# Patient Record
Sex: Male | Born: 1995 | Race: Black or African American | Hispanic: No | Marital: Single | Smoking: Never smoker
Health system: Southern US, Community
[De-identification: ages and names within clinical notes are randomized; demographics above are authoritative.]

## PROBLEM LIST (undated history)

## (undated) DIAGNOSIS — E669 Obesity, unspecified: Secondary | ICD-10-CM

## (undated) DIAGNOSIS — E662 Morbid (severe) obesity with alveolar hypoventilation: Secondary | ICD-10-CM

## (undated) DIAGNOSIS — Q8711 Prader-Willi syndrome: Secondary | ICD-10-CM

## (undated) DIAGNOSIS — I7300 Raynaud's syndrome without gangrene: Secondary | ICD-10-CM

## (undated) DIAGNOSIS — G4730 Sleep apnea, unspecified: Secondary | ICD-10-CM

## (undated) DIAGNOSIS — N289 Disorder of kidney and ureter, unspecified: Secondary | ICD-10-CM

## (undated) DIAGNOSIS — E119 Type 2 diabetes mellitus without complications: Secondary | ICD-10-CM

## (undated) DIAGNOSIS — I10 Essential (primary) hypertension: Secondary | ICD-10-CM

## (undated) DIAGNOSIS — I509 Heart failure, unspecified: Secondary | ICD-10-CM

## (undated) HISTORY — DX: Prader-Willi syndrome: Q87.11

## (undated) HISTORY — DX: Morbid (severe) obesity with alveolar hypoventilation: E66.2

## (undated) HISTORY — DX: Obesity, unspecified: E66.9

## (undated) HISTORY — DX: Sleep apnea, unspecified: G47.30

## (undated) HISTORY — PX: TONSILLECTOMY: SUR1361

## (undated) HISTORY — PX: NISSEN FUNDOPLICATION: SHX2091

---
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
## 2013-01-07 ENCOUNTER — Encounter: Payer: Self-pay | Admitting: Emergency Medicine

## 2013-01-07 ENCOUNTER — Ambulatory Visit (INDEPENDENT_AMBULATORY_CARE_PROVIDER_SITE_OTHER): Payer: Medicaid Other | Admitting: Emergency Medicine

## 2013-01-07 VITALS — BP 128/82 | HR 107 | Temp 98.5°F | Ht <= 58 in | Wt 358.6 lb

## 2013-01-07 DIAGNOSIS — E662 Morbid (severe) obesity with alveolar hypoventilation: Secondary | ICD-10-CM

## 2013-01-07 DIAGNOSIS — J45909 Unspecified asthma, uncomplicated: Secondary | ICD-10-CM

## 2013-01-07 DIAGNOSIS — J309 Allergic rhinitis, unspecified: Secondary | ICD-10-CM

## 2013-01-07 NOTE — Assessment & Plan Note (Signed)
Continue zyrtec and singulair Try Nasonex 2 sprays each nostril daily. If this spray helps with drainage then call our office and we will send a prescription for fluticasone to your pharmacy.

## 2013-01-07 NOTE — Patient Instructions (Addendum)
Please continue your BiPAP 20/15 + oxygen every night Use your pulmicort twice a day Use albuterol as needed Continue your exercise program Continue zyrtec and singulair Try Nasonex 2 sprays each nostril daily. If this spray helps with drainage then call our office and we will send a prescription for fluticasone to your pharmacy.  Follow with Dr Delton Coombes in 4 months or sooner if you have any problems.

## 2013-01-07 NOTE — Progress Notes (Signed)
Subjective:    Patient ID: [name], male    DOB: 10-21-1995, 17 y.o.   MRN: [id number]   HPI 17 yo never smoker, Prader-Willi Syndrome c/b obesity, OSA/OHS using BiPAP 20/15 + 4L/min O2 every night, also w hx HTN, allergic rhinitis, kidney atrophy (on ddAVP, oxybut). Was dx w asthma as a child, has continued to have into teen years.  He has an occasional cough, wheeze with exertion. Takes zyrtec, Singulair for allergies - still w allergy sx and drainage right now. Newly on pulmicort 4 puff bid. Albuterol 3-4x a week. He walks with mom daily.    Review of Systems  Constitutional: Negative for fever and unexpected weight change.  HENT: Positive for congestion, postnasal drip and sinus pressure. Negative for ear pain, nosebleeds, sore throat, rhinorrhea, sneezing, trouble swallowing and dental problem.   Eyes: Negative for redness and itching.  Respiratory: Positive for shortness of breath and wheezing. Negative for cough and chest tightness.   Cardiovascular: Negative for palpitations and leg swelling.  Gastrointestinal: Negative for nausea and vomiting.  Genitourinary: Negative for dysuria.  Musculoskeletal: Negative for joint swelling.  Skin: Negative for rash.  Neurological: Negative for headaches.  Hematological: Does not bruise/bleed easily.  Psychiatric/Behavioral: Negative for dysphoric mood. The patient is not nervous/anxious.    Past Medical History  Diagnosis Date  . Asthma   . Prader-Willi syndrome   . Obesity   . Obesity hypoventilation syndrome   . Sleep apnea      No family history on file.   History   Social History  . Marital Status: Single    Spouse Name: N/A    Number of Children: N/A  . Years of Education: N/A   Occupational History  . Not on file.   Social History Main Topics  . Smoking status: Never Smoker   . Smokeless tobacco: Never Used  . Alcohol Use: No  . Drug Use: No  . Sexually Active: No   Other Topics Concern  . Not on file    Social History Narrative   Lives with mom, very supportive.      No Known Allergies   Outpatient Prescriptions Prior to Visit  Medication Sig Dispense Refill  . budesonide (PULMICORT) 180 MCG/ACT inhaler Inhale 4 puffs into the lungs 2 (two) times daily.  2 Inhaler  3  . cetirizine (ZYRTEC ALLERGY) 10 MG tablet Take 1 tablet (10 mg total) by mouth daily.  30 tablet  11  . clotrimazole (LOTRIMIN) 1 % cream apply to affected area twice a day  30 g  1  . desmopressin (DDAVP) 0.2 MG tablet Take 0.2 mg by mouth at bedtime.        Marland Kitchen lisinopril-hydrochlorothiazide (PRINZIDE,ZESTORETIC) 20-12.5 MG per tablet take 1 tablet by mouth once daily  30 tablet  2  . Misc. Devices MISC Please provide a pulse oximeter for the ear lobe. Patient has diagnosis of Raynaud, asthma  1 each  0  . montelukast (SINGULAIR) 10 MG tablet take 1 tablet by mouth at bedtime  30 tablet  3  . mupirocin ointment (BACTROBAN) 2 % apply to affected area three times a day for 7 days  22 g  0  . nystatin (MYCOSTATIN/NYSTOP) 100000 UNIT/GM POWD APPLY TO AREA WITHIN SKIN FOLDS TWO TIMES A DAY WHEN THERE IS REDNESS OR IRRITATION PRESENT  15 g  0  . oxybutynin (DITROPAN) 5 MG tablet Take 1 tablet (5 mg total) by mouth at bedtime.  30 tablet  3  .  PROAIR HFA 108 (90 BASE) MCG/ACT inhaler inhale 2 puffs every 6 hours if needed  8.5 g  2  . Respiratory Therapy Supplies (NEBULIZER/ADULT MASK) KIT 1 Device by Does not apply route once.  1 each  0  . Respiratory Therapy Supplies MISC BiPap, titration setting 20/15  1 each  0  . Spacer/Aero-Holding Chambers (AEROCHAMBER MV) inhaler Use with albuterol inhaler  1 each  1  . budesonide (PULMICORT) 0.5 MG/2ML nebulizer solution Take 2 mLs (0.5 mg total) by nebulization 2 (two) times daily.  120 mL  11  . albuterol (PROVENTIL) nebulizer solution 2.5 mg       . ipratropium (ATROVENT) 0.02 % nebulizer solution 0.5 mg        No facility-administered medications prior to visit.          Objective:   Physical Exam Filed Vitals:   01/07/13 1456  BP: 128/82  Pulse: 107  Temp: 98.5 F (36.9 C)  TempSrc: Oral  Height: 4\' 9"  (1.448 m)  Weight: 358 lb 9.6 oz (162.66 kg)  SpO2: 96%   Gen: Pleasant, morbidly obese, in no distress,  Quiet affect but does speak and answer questions.   ENT: No lesions,  mouth clear,  oropharynx clear, no active  postnasal drip  Neck: No JVD, no TMG, no carotid bruits  Lungs: No use of accessory muscles, very distant, clear without rales or rhonchi  Cardiovascular: RRR, heart sounds normal, no murmur or gallops, no peripheral edema  Musculoskeletal: No deformities, no cyanosis or clubbing  Neuro: alert, answers simple questions, moves all ext, faces consistent w his hx P-W syndrome.   Skin: Warm, no lesions or rashes     Assessment & Plan:  Obesity hypoventilation syndrome continue your BiPAP 20/15 + oxygen every night Not requiring O2 during the day currently  ASTHMA, PERSISTENT Use your pulmicort twice a day Use albuterol as needed Continue your exercise program Manage allergies as an exacerbating factor Follow with Dr Delton Coombes in 4 months or sooner if you have any problems  Allergic rhinitis Continue zyrtec and singulair Try Nasonex 2 sprays each nostril daily. If this spray helps with drainage then call our office and we will send a prescription for fluticasone to your pharmacy.

## 2013-01-07 NOTE — Assessment & Plan Note (Signed)
Use your pulmicort twice a day Use albuterol as needed Continue your exercise program Manage allergies as an exacerbating factor Follow with Dr Delton Coombes in 4 months or sooner if you have any problems

## 2013-01-07 NOTE — Assessment & Plan Note (Signed)
continue your BiPAP 20/15 + oxygen every night Not requiring O2 during the day currently

## 2013-01-19 ENCOUNTER — Encounter: Payer: Self-pay | Admitting: Family Medicine

## 2013-01-19 ENCOUNTER — Ambulatory Visit (INDEPENDENT_AMBULATORY_CARE_PROVIDER_SITE_OTHER): Payer: Medicaid Other | Admitting: Family Medicine

## 2013-01-19 VITALS — BP 128/82 | HR 116 | Temp 99.5°F | Ht <= 58 in | Wt 356.0 lb

## 2013-01-19 DIAGNOSIS — J45909 Unspecified asthma, uncomplicated: Secondary | ICD-10-CM

## 2013-01-19 DIAGNOSIS — J309 Allergic rhinitis, unspecified: Secondary | ICD-10-CM

## 2013-01-19 MED ORDER — FLUTICASONE PROPIONATE 50 MCG/ACT NA SUSP: 2.0000 | Freq: Every day | NASAL | Status: DC

## 2013-01-19 MED ORDER — BECLOMETHASONE DIPROPIONATE 80 MCG/ACT IN AERS: 4.0000 | INHALATION_SPRAY | Freq: Two times a day (BID) | RESPIRATORY_TRACT | Status: DC

## 2013-01-19 NOTE — Assessment & Plan Note (Signed)
Appreciate pulmonology consultation. Try changing from maximum dose Pulmicort to QVAR due to concern he is not inhaling all of powder completely.

## 2013-01-19 NOTE — Patient Instructions (Addendum)
Follow-up in 6 months  Have fun at the beach

## 2013-01-19 NOTE — Progress Notes (Signed)
  Subjective:    Patient ID: [name], male    DOB: 04-23-1996, 17 y.o.   MRN: [id number]  HPI # PULM: persistent asthma, OHS, OSA on BiPap, allergic rhinitis He saw Dr. Delton Coombes recently for initial consultation.  Nasonex working well; she is wondering if she can get Rx for Flonase She would like to try QVAR instead of Pulmicort because she is concerned he is not inhaling all of the powder completely; he has a chamber for albuterol   Review of Systems Denies worsening dyspnea  Allergies, medication, past medical history reviewed.  Smoking status noted.     Objective:   Physical Exam Wt stable from last visit GEN: NAD; morbidly obese CV: RRR PULM: mildly increased WOB (baseline); distant breath sounds due to girth     Assessment & Plan:

## 2013-01-19 NOTE — Assessment & Plan Note (Signed)
Nasonex working well for him. Rx for Flonase.

## 2013-01-21 ENCOUNTER — Ambulatory Visit: Payer: Medicaid Other | Admitting: Family Medicine

## 2013-02-08 ENCOUNTER — Other Ambulatory Visit: Payer: Self-pay | Admitting: Family Medicine

## 2013-02-08 NOTE — Telephone Encounter (Signed)
Please advise patient that Rx was refilled, though if needs additional refills will need to be seen in clinic for this issue. Thanks.

## 2013-02-12 ENCOUNTER — Ambulatory Visit (INDEPENDENT_AMBULATORY_CARE_PROVIDER_SITE_OTHER): Payer: Medicaid Other | Admitting: Family Medicine

## 2013-02-12 VITALS — BP 112/71 | HR 116 | Temp 98.1°F | Wt 353.0 lb

## 2013-02-12 DIAGNOSIS — Q8711 Prader-Willi syndrome: Secondary | ICD-10-CM

## 2013-02-12 NOTE — Assessment & Plan Note (Signed)
Patient here for wheel chair assessment due to obesity related to prader willi. I agree with need for motorized wheel chair as patient is unable to ambulate any significant distance without become short of breath related to his body habitus. Forms filled out and placed in the to fax box.

## 2013-02-12 NOTE — Progress Notes (Signed)
  Subjective:    Patient ID: [name], male    DOB: July 06, 1996, 17 y.o.   MRN: [id number]  HPI Patient is a 17 yo male here for mobility examination for wheel chair assessment.  Patients mother provide the history. Patient not able to bathe or dress self or ambulate long distances without becoming short of breath. Is able to feed self. Not able to use cane or walker due to need for seated resting position to help with breathing. Manual wheel chair unable to use due to not enough strength to propel self. Patient has been evaluated by rehab center and deemed to be able to operate a power wheel chair safely.  Patient with shortness of breath related to obesity hypoventilations syndrome. Obesity related to prader-willi syndrome. Condition present since birth. Currently stable. Has tried manual wheel chair in past, but does not have the strength to propel this.    Review of Systems see HPI     Objective:   Physical Exam  Constitutional: He appears well-developed and well-nourished.  Obese  Cardiovascular: Normal rate, regular rhythm and normal heart sounds.   Pulmonary/Chest: Effort normal and breath sounds normal.  Difficult to appreciate due to body habitus On ambulation the patient became visibly short of breath at about 40 feet.  Musculoskeletal:  Range of motion in bilateral LE diminished due to body habitus Good posture. Good sitting and standing balance  Neurological:  Normal gait  BP 112/71  Pulse 116  Temp(Src) 98.1 F (36.7 C) (Oral)  Wt 353 lb (160.12 kg)  BMI 76.37 kg/m2    Assessment & Plan:

## 2013-02-12 NOTE — Patient Instructions (Signed)
Nice to meet you today. I will fill out the wheel chair forms and send them in today. I also placed a referral for physical therapy, someone should call regarding this.

## 2013-02-18 ENCOUNTER — Other Ambulatory Visit: Payer: Self-pay | Admitting: Family Medicine

## 2013-02-19 ENCOUNTER — Ambulatory Visit: Payer: Medicaid Other | Attending: Family Medicine | Admitting: Physical Therapy

## 2013-02-19 DIAGNOSIS — R262 Difficulty in walking, not elsewhere classified: Secondary | ICD-10-CM | POA: Insufficient documentation

## 2013-02-19 DIAGNOSIS — IMO0001 Reserved for inherently not codable concepts without codable children: Secondary | ICD-10-CM | POA: Insufficient documentation

## 2013-02-24 ENCOUNTER — Telehealth: Payer: Self-pay | Admitting: Family Medicine

## 2013-02-24 DIAGNOSIS — E662 Morbid (severe) obesity with alveolar hypoventilation: Secondary | ICD-10-CM

## 2013-02-24 NOTE — Telephone Encounter (Signed)
Mother called and would like Dr.Sonnneberg write orders stating that he can have oxygen all time. The physical therapy doctors suggested this. JW

## 2013-02-25 NOTE — Telephone Encounter (Signed)
Patients mother should have the physical therapist fax [name] their recommendations prior to me placing this order. Will be happy to do this once we receive this from PT.

## 2013-02-26 NOTE — Telephone Encounter (Signed)
Mother notified of message, see below.  Manjot Beumer, Darlyne Russian, CMA

## 2013-03-04 ENCOUNTER — Telehealth: Payer: Self-pay | Admitting: Family Medicine

## 2013-03-04 DIAGNOSIS — Q8711 Prader-Willi syndrome: Secondary | ICD-10-CM

## 2013-03-04 NOTE — Telephone Encounter (Signed)
Looked in Dr.Sonnenbergs box up front and didn't see form.  Will fwd to MD to see if he has seen form.  Reymond Maynez, Darlyne Russian, CMA

## 2013-03-04 NOTE — Telephone Encounter (Signed)
Received documents from PT. Order for O2 signed today.

## 2013-03-04 NOTE — Telephone Encounter (Signed)
Rx stating patient may use O2 therapy in caregivers care left up front for mother to pick up.  LMOVM.  Laquitha Heslin, Darlyne Russian, CMA

## 2013-03-04 NOTE — Telephone Encounter (Signed)
Mother called wanting to know what the status update is on the orders for oxygen. She said that the Physical therapist faxed [name] the form on Monday. If we didn't get it to let her know so they can re-fax it again. JW

## 2013-03-05 NOTE — Telephone Encounter (Signed)
Rx filled out to specifications. Is in the box at front dest to pick up.

## 2013-03-05 NOTE — Telephone Encounter (Signed)
Mother came to pick up prescription and stated that it must be more specific : how many liters? Via cannula/mask/etc.? To maintain o2 stat at? Apply when o2 stat gets to ?  Please let her know when ready. Script in your box. Wyatt Haste, RN-BSN

## 2013-03-08 NOTE — Telephone Encounter (Signed)
Pt's mother notified.  Maximo Spratling L, CMA  

## 2013-03-09 ENCOUNTER — Ambulatory Visit: Payer: Medicaid Other | Attending: Family Medicine | Admitting: Physical Therapy

## 2013-03-09 DIAGNOSIS — IMO0001 Reserved for inherently not codable concepts without codable children: Secondary | ICD-10-CM | POA: Insufficient documentation

## 2013-03-09 DIAGNOSIS — R262 Difficulty in walking, not elsewhere classified: Secondary | ICD-10-CM | POA: Insufficient documentation

## 2013-03-11 ENCOUNTER — Ambulatory Visit: Payer: Medicaid Other | Admitting: Physical Therapy

## 2013-03-15 ENCOUNTER — Ambulatory Visit: Payer: Medicaid Other | Admitting: Physical Therapy

## 2013-03-18 ENCOUNTER — Other Ambulatory Visit: Payer: Self-pay | Admitting: Family Medicine

## 2013-03-19 ENCOUNTER — Ambulatory Visit: Payer: Medicaid Other | Admitting: Physical Therapy

## 2013-03-23 ENCOUNTER — Ambulatory Visit: Payer: Medicaid Other | Admitting: Physical Therapy

## 2013-03-25 ENCOUNTER — Ambulatory Visit: Payer: Medicaid Other | Admitting: Physical Therapy

## 2013-03-30 ENCOUNTER — Ambulatory Visit: Payer: Medicaid Other | Admitting: Physical Therapy

## 2013-03-30 ENCOUNTER — Telehealth: Payer: Self-pay | Admitting: Family Medicine

## 2013-03-30 NOTE — Telephone Encounter (Signed)
Pt's mother dropped off paperwork to be filled out regarding [name] being able to get oxygen or albuterol if needed at school.

## 2013-03-30 NOTE — Telephone Encounter (Signed)
Placed in Dr. Antony Haste box Wyatt Haste, RN-BSN

## 2013-03-31 ENCOUNTER — Telehealth: Payer: Self-pay | Admitting: *Deleted

## 2013-03-31 ENCOUNTER — Encounter: Payer: Self-pay | Admitting: Family Medicine

## 2013-03-31 NOTE — Telephone Encounter (Signed)
Mother called and informed form is ready Wyatt Haste, RN-BSN

## 2013-04-01 ENCOUNTER — Ambulatory Visit: Payer: Medicaid Other | Admitting: Physical Therapy

## 2013-04-06 ENCOUNTER — Ambulatory Visit: Payer: Medicaid Other | Admitting: Physical Therapy

## 2013-04-08 ENCOUNTER — Ambulatory Visit: Payer: Medicaid Other | Attending: Family Medicine | Admitting: Physical Therapy

## 2013-04-08 DIAGNOSIS — R262 Difficulty in walking, not elsewhere classified: Secondary | ICD-10-CM | POA: Insufficient documentation

## 2013-04-08 DIAGNOSIS — IMO0001 Reserved for inherently not codable concepts without codable children: Secondary | ICD-10-CM | POA: Insufficient documentation

## 2013-04-14 ENCOUNTER — Other Ambulatory Visit: Payer: Self-pay | Admitting: Family Medicine

## 2013-04-15 ENCOUNTER — Ambulatory Visit: Payer: Medicaid Other | Admitting: Physical Therapy

## 2013-04-20 ENCOUNTER — Other Ambulatory Visit: Payer: Self-pay | Admitting: Family Medicine

## 2013-04-22 ENCOUNTER — Ambulatory Visit: Payer: Medicaid Other | Admitting: Physical Therapy

## 2013-05-19 ENCOUNTER — Other Ambulatory Visit: Payer: Self-pay | Admitting: Family Medicine

## 2013-06-04 ENCOUNTER — Telehealth: Payer: Self-pay | Admitting: Family Medicine

## 2013-06-04 NOTE — Telephone Encounter (Signed)
Mother called because she is unsure why Dr. Birdie Sons would not refill her son's Bactroban 2%. She was told by the pharmacy that he said he needed a f/u before he could prescribe this. She would like some more explanation. JW

## 2013-06-04 NOTE — Telephone Encounter (Signed)
Please advise.  I looked through telephone notes and didn't see where it had been requested from pharmacy.  Was it a faxed rx?  Olon Russ,CMA

## 2013-06-06 MED ORDER — MUPIROCIN 2 % EX OINT: TOPICAL_OINTMENT | CUTANEOUS | Status: DC

## 2013-06-06 NOTE — Telephone Encounter (Signed)
I did not refill it initially due to the fact that I did not originally prescribe this medication and could not locate a reason for this prescription initially in his chart. 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 I sent in a prescription for this medication to his pharmacy. Please advise his mother of this, but also request that they come in for a follow-up appointment for this issue as I have not ever seen him for this and because the current issue may be different from his previous issue. Thanks.

## 2013-06-07 NOTE — Telephone Encounter (Signed)
appt made for 06/30/13 @2 :30pm.    Jazmin Hartsell,CMA

## 2013-06-30 ENCOUNTER — Encounter: Payer: Self-pay | Admitting: Family Medicine

## 2013-06-30 ENCOUNTER — Ambulatory Visit (INDEPENDENT_AMBULATORY_CARE_PROVIDER_SITE_OTHER): Payer: Medicaid Other | Admitting: Family Medicine

## 2013-06-30 VITALS — BP 132/72 | HR 100 | Temp 98.5°F | Ht <= 58 in | Wt 373.0 lb

## 2013-06-30 DIAGNOSIS — I10 Essential (primary) hypertension: Secondary | ICD-10-CM

## 2013-06-30 DIAGNOSIS — L304 Erythema intertrigo: Secondary | ICD-10-CM

## 2013-06-30 DIAGNOSIS — L538 Other specified erythematous conditions: Secondary | ICD-10-CM

## 2013-06-30 DIAGNOSIS — Z23 Encounter for immunization: Secondary | ICD-10-CM

## 2013-06-30 DIAGNOSIS — R635 Abnormal weight gain: Secondary | ICD-10-CM

## 2013-06-30 LAB — BASIC METABOLIC PANEL
BUN: 26 mg/dL — ABNORMAL HIGH (ref 6–23)
CO2: 26 mEq/L (ref 19–32)
Calcium: 9.4 mg/dL (ref 8.4–10.5)
Creat: 0.83 mg/dL (ref 0.50–1.35)

## 2013-06-30 MED ORDER — NYSTATIN-TRIAMCINOLONE 100000-0.1 UNIT/GM-% EX CREA: 1.0000 "application " | TOPICAL_CREAM | Freq: Two times a day (BID) | CUTANEOUS | Status: DC

## 2013-06-30 NOTE — Patient Instructions (Signed)
Nice to see you again. I have refilled Brelan's bactroban and nystatin. We will call with the results of the culture and blood tests.

## 2013-07-02 DIAGNOSIS — R635 Abnormal weight gain: Secondary | ICD-10-CM | POA: Insufficient documentation

## 2013-07-02 NOTE — Assessment & Plan Note (Signed)
Culture of serous fluid taken. Does not appear infected. Will continue bactroban and nystatin for this.

## 2013-07-02 NOTE — Assessment & Plan Note (Signed)
Patient has gained a significant amount of weight over the past year. 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 Will discuss weight loss strategies at his next visit.

## 2013-07-02 NOTE — Assessment & Plan Note (Signed)
At goal. Continue lisinopril/HCTZ. Will obtain BMET.

## 2013-07-02 NOTE — Progress Notes (Signed)
Patient ID: [name], male   DOB: 02-03-1996, 17 y.o.   MRN: [id number]  Marikay Alar, MD Phone: [phone]  [name] is a 17 y.o. male who presents today for f/u intertrigo and HTN.  Patient has had this intermittently for a long time. Mom states has been treated with bactroban ointment and nystatin powder and this has been effective. She notes he currently has some redness in the skin folds on his back and some weeping from these areas. She does not note any spread of the erythema. He denies pain, though states this area itches.  HYPERTENSION Disease Monitoring Home BP Monitoring no Chest pain- no    Dyspnea- no Medications Compliance-  Taking lisinopril/HCTZ   Past Medical History  Diagnosis Date  . Asthma   . Prader-Willi syndrome   . Obesity   . Obesity hypoventilation syndrome   . Sleep apnea     History  Smoking status  . Never Smoker   Smokeless tobacco  . Never Used    No family history on file.  Current Outpatient Prescriptions on File Prior to Visit  Medication Sig Dispense Refill  . albuterol (PROVENTIL) (2.5 MG/3ML) 0.083% nebulizer solution INHALE 1 VIAL IN NEBULIZER ONCE DAILY  75 mL  12  . beclomethasone (QVAR) 80 MCG/ACT inhaler Inhale 4 puffs into the lungs 2 (two) times daily.  2 Inhaler  12  . cetirizine (ZYRTEC) 10 MG tablet take 1 tablet by mouth once daily  30 tablet  11  . clotrimazole (LOTRIMIN) 1 % cream apply to affected area twice a day  30 g  0  . desmopressin (DDAVP) 0.2 MG tablet Take 0.2 mg by mouth at bedtime.        . fluticasone (FLONASE) 50 MCG/ACT nasal spray Place 2 sprays into the nose daily.  16 g  6  . lisinopril-hydrochlorothiazide (PRINZIDE,ZESTORETIC) 20-12.5 MG per tablet take 1 tablet by mouth once daily  30 tablet  2  . Misc. Devices MISC Please provide a pulse oximeter for the ear lobe. Patient has diagnosis of Raynaud, asthma  1 each  0  . montelukast (SINGULAIR) 10 MG tablet take 1 tablet by mouth at bedtime   30 tablet  3  . mupirocin ointment (BACTROBAN) 2 % apply to affected area three times a day for 7 days  22 g  0  . nystatin (MYCOSTATIN/NYSTOP) 100000 UNIT/GM POWD APPLY TO AREA WITHIN SKIN FOLDS TWO TIMES A DAY WHEN THERE IS REDNESS OR IRRITATION PRESENT  15 g  0  . oxybutynin (DITROPAN) 5 MG tablet Take 1 tablet (5 mg total) by mouth at bedtime.  30 tablet  3  . PROAIR HFA 108 (90 BASE) MCG/ACT inhaler inhale 2 puffs every 6 hours if needed  8.5 g  2  . Respiratory Therapy Supplies (NEBULIZER/ADULT MASK) KIT 1 Device by Does not apply route once.  1 each  0  . Respiratory Therapy Supplies MISC BiPap, titration setting 20/15  1 each  0  . Spacer/Aero-Holding Chambers (AEROCHAMBER MV) inhaler Use with albuterol inhaler  1 each  1   No current facility-administered medications on file prior to visit.    ROS: Per HPI   Physical Exam Filed Vitals:   06/30/13 1503  BP: 132/72  Pulse: 100  Temp: 98.5 F (36.9 C)    Physical Examination: General appearance - alert, well appearing, and in no distress Chest - clear to auscultation, no wheezes, rales or rhonchi, symmetric air entry Heart - normal  rate, regular rhythm, normal S1, S2, no murmurs, rubs, clicks or gallops Skin - patient with intertrigo in skin folds of back, note intermittent areas of skin openings with weeping of serous material, areas of erythema limited to skin folds    Chemistry      Component Value Date/Time   NA 138 06/30/2013 1554   K 4.6 06/30/2013 1554   CL 100 06/30/2013 1554   CO2 26 06/30/2013 1554   BUN 26* 06/30/2013 1554   CREATININE 0.83 06/30/2013 1554   CREATININE 0.78 10/08/2010 0649      Component Value Date/Time   CALCIUM 9.4 06/30/2013 1554   ALKPHOS 143 10/05/2010 1329   AST 49* 10/05/2010 1329   ALT 29 10/05/2010 1329   BILITOT 1.7* 10/05/2010 1329      Assessment/Plan: Please see individual problem list.

## 2013-07-03 LAB — WOUND CULTURE

## 2013-07-05 ENCOUNTER — Telehealth: Payer: Self-pay | Admitting: *Deleted

## 2013-07-05 NOTE — Telephone Encounter (Signed)
Pt mom informed and appt made for 08/04/13 (1st avaliable). [name], [name]

## 2013-07-05 NOTE — Telephone Encounter (Signed)
I wanted to discuss the exact things she brought up. They should continue to do those things and we can monitor his weight at his follow-up visit in 2-3 months.

## 2013-07-05 NOTE — Telephone Encounter (Signed)
Spoke with mom and she would like to know exactly what you would like to discuss.  States that he does physical therapy, works out and she watches what he eats.  Mom is unsure just what the plans for this visit might be before she schedules this. Jazmin Hartsell,CMA

## 2013-07-05 NOTE — Telephone Encounter (Signed)
Message copied by Henri Medal on Mon Jul 05, 2013  9:44 AM ------      Message from: Birdie Sons, ERIC G      Created: Fri Jul 02, 2013 12:26 PM       Please call the patients mother to have him follow-up sometime in the next couple of weeks regarding his weight gain over the past year. He is currently at his highest documented weight. ------

## 2013-07-12 ENCOUNTER — Ambulatory Visit (INDEPENDENT_AMBULATORY_CARE_PROVIDER_SITE_OTHER): Payer: Medicaid Other | Admitting: Emergency Medicine

## 2013-07-12 ENCOUNTER — Encounter: Payer: Self-pay | Admitting: Emergency Medicine

## 2013-07-12 VITALS — BP 140/82 | HR 111 | Ht 59.0 in | Wt 370.0 lb

## 2013-07-12 DIAGNOSIS — E662 Morbid (severe) obesity with alveolar hypoventilation: Secondary | ICD-10-CM

## 2013-07-12 DIAGNOSIS — J45909 Unspecified asthma, uncomplicated: Secondary | ICD-10-CM

## 2013-07-12 MED ORDER — ALBUTEROL SULFATE HFA 108 (90 BASE) MCG/ACT IN AERS: INHALATION_SPRAY | RESPIRATORY_TRACT | Status: DC

## 2013-07-12 NOTE — Assessment & Plan Note (Signed)
Will continue current BiPAP. Supplies through [employer]. In good repair.

## 2013-07-12 NOTE — Progress Notes (Signed)
  Subjective:    Patient ID: [name], male    DOB: 1996/06/14, 17 y.o.   MRN: [id number]   HPI 17 yo never smoker, Prader-Willi Syndrome c/b obesity, OSA/OHS using BiPAP 20/15 + 4L/min O2 every night, also w hx HTN, allergic rhinitis, kidney atrophy (on ddAVP, oxybut). Was dx w asthma as a child, has continued to have into teen years.  He has an occasional cough, wheeze with exertion. Takes zyrtec, Singulair for allergies - still w allergy sx and drainage right now. Newly on pulmicort 4 puff bid. Albuterol 3-4x a week. He walks with mom daily.   ROV 07/12/13 -- Prader-Willi Syndrome c/b obesity, OSA/OHS using BiPAP 20/15 + 4L/min.  Carries dx asthma, allergic rhinitis. His pulmicort has been changed to 4 puff QVAR 80 twice a day. He uses albuterol for dyspnea / SOB about 3-4x a week. Good compliance with BiPAP. No exacerbations since last time. Continues to exercise.    Review of Systems  Constitutional: Negative for fever and unexpected weight change.  HENT: Positive for congestion, postnasal drip and sinus pressure. Negative for dental problem, ear pain, nosebleeds, rhinorrhea, sneezing, sore throat and trouble swallowing.   Eyes: Negative for redness and itching.  Respiratory: Positive for shortness of breath and wheezing. Negative for cough and chest tightness.   Cardiovascular: Negative for palpitations and leg swelling.  Gastrointestinal: Negative for nausea and vomiting.  Genitourinary: Negative for dysuria.  Musculoskeletal: Negative for joint swelling.  Skin: Negative for rash.  Neurological: Negative for headaches.  Hematological: Does not bruise/bleed easily.  Psychiatric/Behavioral: Negative for dysphoric mood. The patient is not nervous/anxious.        Objective:   Physical Exam Filed Vitals:   07/12/13 1648  BP: 140/82  Pulse: 111  Height: 4\' 11"  (1.499 m)  Weight: 316 lb (143.337 kg)  SpO2: 92%   Gen: Pleasant, morbidly obese, in no distress,  Quiet affect but  does speak and answer questions.   ENT: No lesions,  mouth clear,  oropharynx clear, no active  postnasal drip  Neck: No JVD, no TMG, no carotid bruits  Lungs: No use of accessory muscles, very distant, clear without rales or rhonchi  Cardiovascular: RRR, heart sounds normal, no murmur or gallops, no peripheral edema  Musculoskeletal: No deformities, no cyanosis or clubbing  Neuro: alert, answers simple questions, moves all ext, faces consistent w his hx P-W syndrome.   Skin: Warm, no lesions or rashes     Assessment & Plan:  OBESITY, MORBID Discussed weight loss today. His mother has him on a diet and they are working on exercise  ASTHMA, PERSISTENT Stable on current regimen, will continue QVAR 4 puffs bid. Consider decrease to 2 puffs bid next time if doing well.   Obesity hypoventilation syndrome Will continue current BiPAP. Supplies through [employer]. In good repair.

## 2013-07-12 NOTE — Patient Instructions (Signed)
Please continue your BiPAP mask + oxygen every night Continue QVAR 4 puffs twice a day Use your albuterol as needed. You can use either nebulizer or ProAir 2 puffs if needed for breathing.  Continue to exercise and work on your weight loss.  Follow with Dr Delton Coombes in 6 months or sooner if you have any problems

## 2013-07-12 NOTE — Assessment & Plan Note (Signed)
Stable on current regimen, will continue QVAR 4 puffs bid. Consider decrease to 2 puffs bid next time if doing well.

## 2013-07-12 NOTE — Assessment & Plan Note (Signed)
Discussed weight loss today. His mother has him on a diet and they are working on exercise

## 2013-07-16 ENCOUNTER — Other Ambulatory Visit: Payer: Self-pay | Admitting: Family Medicine

## 2013-08-04 ENCOUNTER — Encounter: Payer: Self-pay | Admitting: Family Medicine

## 2013-08-04 ENCOUNTER — Ambulatory Visit (INDEPENDENT_AMBULATORY_CARE_PROVIDER_SITE_OTHER): Payer: Medicaid Other | Admitting: Family Medicine

## 2013-08-04 DIAGNOSIS — L304 Erythema intertrigo: Secondary | ICD-10-CM

## 2013-08-04 DIAGNOSIS — L538 Other specified erythematous conditions: Secondary | ICD-10-CM

## 2013-08-04 MED ORDER — NYSTATIN 100000 UNIT/GM EX POWD: CUTANEOUS | Status: DC

## 2013-08-04 MED ORDER — MUPIROCIN 2 % EX OINT: TOPICAL_OINTMENT | CUTANEOUS | Status: DC

## 2013-08-04 NOTE — Progress Notes (Signed)
Patient ID: [name], male   DOB: 10-15-1995, 17 y.o.   MRN: [id number]  Marikay Alar, MD Phone: [phone]  [name] is a 17 y.o. male who presents today for discussion of his weight.  Patient has had steady increase in weight over the past 2 years from 318 lbs to 373 lbs. Patient mother states this low weight was when the patient was sick and in the hospital for 10 days. She states she recently changed his diet to mostly vegetables and grilled meats. He is on a calorie restricted diet at 1200 calories daily. They use a plate with portion markers on it to determine calories. Notes he gets some physical activity at school in gym and with his "worker". He is now also walking 65 steps at home twice daily. His physical activity is limited by his oxygen desaturations that occur related to his obesity hypoventilation syndrome. Mom also notes the patient over the past year has been eating his lunch at school, as opposed to previously when she packed it for him.  Mom also notes he has areas of intertrigo on the back of his legs in his popliteal fossa. She notes nystatin powder and bactroban ointment work well for this. Needs refills of these medications.  Past Medical History  Diagnosis Date  . Asthma   . Prader-Willi syndrome   . Obesity   . Obesity hypoventilation syndrome   . Sleep apnea     History  Smoking status  . Never Smoker   Smokeless tobacco  . Never Used    No family history on file.  Current Outpatient Prescriptions on File Prior to Visit  Medication Sig Dispense Refill  . albuterol (PROAIR HFA) 108 (90 BASE) MCG/ACT inhaler inhale 2 puffs every 6 hours if needed  8.5 g  2  . albuterol (PROVENTIL) (2.5 MG/3ML) 0.083% nebulizer solution INHALE 1 VIAL IN NEBULIZER ONCE DAILY  75 mL  12  . cetirizine (ZYRTEC) 10 MG tablet take 1 tablet by mouth once daily  30 tablet  11  . clotrimazole (LOTRIMIN) 1 % cream apply to affected area twice a day  30 g  0  .  desmopressin (DDAVP) 0.2 MG tablet Take 0.2 mg by mouth at bedtime.        . fluticasone (FLONASE) 50 MCG/ACT nasal spray Place 2 sprays into the nose daily.  16 g  6  . lisinopril-hydrochlorothiazide (PRINZIDE,ZESTORETIC) 20-12.5 MG per tablet take 1 tablet by mouth once daily  30 tablet  2  . Misc. Devices MISC Please provide a pulse oximeter for the ear lobe. Patient has diagnosis of Raynaud, asthma  1 each  0  . montelukast (SINGULAIR) 10 MG tablet take 1 tablet by mouth at bedtime  30 tablet  3  . nystatin-triamcinolone (MYCOLOG II) cream Apply 1 application topically 2 (two) times daily.  30 g  0  . oxybutynin (DITROPAN) 5 MG tablet Take 1 tablet (5 mg total) by mouth at bedtime.  30 tablet  3  . QVAR 80 MCG/ACT inhaler inhale 4 puffs INTO THE LUNGS 2 TIMES DAILY  26.1 g  12  . Respiratory Therapy Supplies (NEBULIZER/ADULT MASK) KIT 1 Device by Does not apply route once.  1 each  0  . Respiratory Therapy Supplies MISC BiPap, titration setting 20/15  1 each  0  . Spacer/Aero-Holding Chambers (AEROCHAMBER MV) inhaler Use with albuterol inhaler  1 each  1   No current facility-administered medications on file prior to visit.  ROS: Per HPI   Physical Exam Filed Vitals:   08/04/13 1334  BP: 116/75  Pulse: 101  Temp: 98.1 F (36.7 C)    Physical Examination: General appearance - alert, well appearing, and in no distress Chest - clear to auscultation, no wheezes, rales or rhonchi, symmetric air entry Heart - normal rate, regular rhythm, normal S1, S2, no murmurs, rubs, clicks or gallops Skin - popliteal fossa noted to have hyperpigmented areas in the skin folds  Assessment/Plan: Please see individual problem list.

## 2013-08-04 NOTE — Patient Instructions (Signed)
Nice to see you again.  You are already doing some great things for your weight. Please continue to limit your calories to 1200 a day. And continue your current vegetable heavy diet. Please add additional exercise with doing the 65 steps twice in the morning and twice at night. Please allow for a 10 minute break in between these reps. I will see you back in 1 month to see how your weight is doing.

## 2013-08-05 NOTE — Assessment & Plan Note (Signed)
Discussed need for weight loss and healthy eating with increased activity level. He is to increase the number of times he walks 65 ft to 4 times daily. He was advised to take 10 minute rest breaks between each attempt. He is to continue his current diet as he is already down 6 lbs since his last visit with just this intervention. Will have return for a weight check in 1 month.

## 2013-08-05 NOTE — Assessment & Plan Note (Signed)
Areas of intertrigo in popliteal fossa bilaterally. Refilled nystatin and bactroban. Advised to keep these areas dry.

## 2013-08-17 ENCOUNTER — Other Ambulatory Visit: Payer: Self-pay | Admitting: Family Medicine

## 2013-08-30 ENCOUNTER — Ambulatory Visit (INDEPENDENT_AMBULATORY_CARE_PROVIDER_SITE_OTHER): Payer: Medicaid Other | Admitting: *Deleted

## 2013-08-30 VITALS — Wt 367.3 lb

## 2013-08-30 DIAGNOSIS — E662 Morbid (severe) obesity with alveolar hypoventilation: Secondary | ICD-10-CM

## 2013-08-30 NOTE — Progress Notes (Signed)
Pt in for weight check.  Weight today is 367.3 lbs.  Pt is still going all veggie diet and exercising.  Pt doses at least 4 laps around the track with at least 20 minutes rest time per lap. No other concerns today. Daphine DeutscherMartin, Tamika L

## 2013-09-17 ENCOUNTER — Other Ambulatory Visit: Payer: Self-pay | Admitting: Family Medicine

## 2013-09-24 ENCOUNTER — Telehealth: Payer: Self-pay | Admitting: Family Medicine

## 2013-09-24 NOTE — Telephone Encounter (Signed)
Mother dropped off CAP forms to be filled out.  Please call her when completed.

## 2013-09-27 NOTE — Telephone Encounter (Signed)
Placed in MDs box. [name]  

## 2013-09-29 NOTE — Telephone Encounter (Signed)
Please inform the patients mother that given the extent of the physical exam required to fill out the form the patient will need to be seen in the office. It is not necessary that the patients mother come with him to the appointment, he may come with his home health worker. Thanks.

## 2013-09-29 NOTE — Telephone Encounter (Signed)
Appt 10/11/2013. [name], Joshua RochesterJessica Mckinney

## 2013-10-11 ENCOUNTER — Ambulatory Visit: Payer: Medicaid Other | Admitting: Family Medicine

## 2013-10-18 ENCOUNTER — Other Ambulatory Visit: Payer: Self-pay | Admitting: Family Medicine

## 2013-10-21 ENCOUNTER — Ambulatory Visit (INDEPENDENT_AMBULATORY_CARE_PROVIDER_SITE_OTHER): Payer: Medicaid Other | Admitting: Family Medicine

## 2013-10-21 ENCOUNTER — Encounter: Payer: Self-pay | Admitting: Family Medicine

## 2013-10-21 VITALS — BP 126/84 | HR 100 | Temp 98.6°F | Wt 369.0 lb

## 2013-10-21 DIAGNOSIS — I10 Essential (primary) hypertension: Secondary | ICD-10-CM

## 2013-10-21 NOTE — Progress Notes (Signed)
Patient ID: Evaristo Buryazhae M Behrend, male   DOB: 1995/09/24, 18 y.o.   MRN: [id number]   Marikay AlarEric Sonnenberg, MD Phone: [phone]  [name] is a 18 y.o. male who presents today for HTN and for CAP form PE.  HYPERTENSION Disease Monitoring Home BP Monitoring checking Chest pain- no    Dyspnea- no Medications Compliance-  taking.   Edema- no  CAP form filled out.  Patient is a nonsmoker.   ROS: Per HPI   Physical Exam Filed Vitals:   10/21/13 1633  BP: 130/91  Pulse: 100  Temp: 98.6 F (37 C)    Physical Examination: General appearance - alert, well appearing, and in no distress Mental status - normal mood, behavior, speech, dress, motor activity, and thought processes Eyes - pupils equal and reactive, extraocular eye movements intact Ears - ceruminosis noted Mouth - mucous membranes moist, pharynx normal without lesions Neck - supple, no significant adenopathy Chest - clear to auscultation, no wheezes, rales or rhonchi, symmetric air entry Heart - normal rate, regular rhythm, normal S1, S2, no murmurs, rubs, clicks or gallops Abdomen - soft, nontender, nondistended, no masses or organomegaly Neurological - no focal findings or movement disorder noted Extremities - no pedal edema noted Skin - normal coloration and turgor, no rashes, no suspicious skin lesions noted   Assessment/Plan: Please see individual problem list.

## 2013-10-21 NOTE — Patient Instructions (Signed)
Nice to see you.  Please let me know if there is anything needed prior to his next f/u appointment.

## 2013-11-19 ENCOUNTER — Other Ambulatory Visit: Payer: Self-pay | Admitting: Emergency Medicine

## 2013-11-19 ENCOUNTER — Other Ambulatory Visit: Payer: Self-pay | Admitting: Family Medicine

## 2013-12-15 ENCOUNTER — Other Ambulatory Visit: Payer: Self-pay | Admitting: Family Medicine

## 2014-01-28 ENCOUNTER — Other Ambulatory Visit: Payer: Self-pay | Admitting: Family Medicine

## 2014-02-07 ENCOUNTER — Encounter: Payer: Self-pay | Admitting: Family Medicine

## 2014-02-07 ENCOUNTER — Ambulatory Visit (INDEPENDENT_AMBULATORY_CARE_PROVIDER_SITE_OTHER): Payer: Medicaid Other | Admitting: Family Medicine

## 2014-02-07 VITALS — BP 122/80 | HR 102 | Temp 98.3°F | Wt 371.0 lb

## 2014-02-07 DIAGNOSIS — Q8711 Prader-Willi syndrome: Secondary | ICD-10-CM

## 2014-02-07 MED ORDER — ALBUTEROL SULFATE (2.5 MG/3ML) 0.083% IN NEBU: INHALATION_SOLUTION | RESPIRATORY_TRACT | Status: DC

## 2014-02-07 NOTE — Assessment & Plan Note (Signed)
Patient with mobility issues requiring motorized wheel chair. Lack of transportation options and methods to get wheel chair out of the house severely limit the patients opportunities to do additional activities outside of his living environment. A prescription for a home and car wheel chair ramp were provided to the patients assistant. She was advised to give these to the patients mother and to contact a home health agency to coordinate obtaining these items. Additionally PT referral made to help with mobility issues.

## 2014-02-07 NOTE — Progress Notes (Signed)
Patient ID: [name], male   DOB: 11-01-1995, 18 y.o.   MRN: [id number]  Marikay AlarEric Jayel Scaduto, MD Phone: [phone]  [name] is a 18 y.o. male who presents today for f/u.  Patient presents with home assistant with request for prescription for wheel chair ramp for home and for car. Patient is severely limited in mobility due to morbid obesity relating to prader willi syndrome. Patient uses motorized wheel chair to mobilize around his home, though when he goes out of the house he has no means of taking the wheel chair with him. He is only able to ambulate short distances prior to having to take a break. The lack of transport options for his wheel chair limits the number of activities he can do outside the home. He denies shortness of breath today. He has previously had PT services to help with mobility issues and mother requests that another referral for PT.    Patient is a nonsmoker.   ROS: Per HPI   Physical Exam Filed Vitals:   02/07/14 1401  BP: 122/80  Pulse: 102  Temp: 98.3 F (36.8 C)    Gen: Well NAD, morbidly obese Lungs: CTABL Nl WOB Heart: RRR no MRG Exts: Non edematous BL  LE  Assessment/Plan: Please see individual problem list.  # Healthcare maintenance: up to date

## 2014-02-07 NOTE — Patient Instructions (Signed)
Nice to meet you. Please contact [facility] or [facility] for the wheel chair ramps. I sent in a refill of his albuterol. If he feels short of breath or wheezes please try taking 2 puffs of this.

## 2014-02-11 ENCOUNTER — Other Ambulatory Visit: Payer: Self-pay | Admitting: Family Medicine

## 2014-02-16 ENCOUNTER — Other Ambulatory Visit: Payer: Self-pay | Admitting: Emergency Medicine

## 2014-02-18 ENCOUNTER — Ambulatory Visit: Payer: Medicaid Other | Admitting: Physical Therapy

## 2014-02-21 ENCOUNTER — Ambulatory Visit: Payer: Medicaid Other | Admitting: Physical Therapy

## 2014-02-21 ENCOUNTER — Ambulatory Visit: Payer: Medicaid Other | Attending: Family Medicine | Admitting: Occupational Therapy

## 2014-02-21 DIAGNOSIS — R269 Unspecified abnormalities of gait and mobility: Secondary | ICD-10-CM | POA: Insufficient documentation

## 2014-02-21 DIAGNOSIS — Q8711 Prader-Willi syndrome: Secondary | ICD-10-CM | POA: Diagnosis not present

## 2014-02-21 DIAGNOSIS — Z5189 Encounter for other specified aftercare: Secondary | ICD-10-CM | POA: Insufficient documentation

## 2014-02-21 DIAGNOSIS — M6281 Muscle weakness (generalized): Secondary | ICD-10-CM | POA: Insufficient documentation

## 2014-02-21 DIAGNOSIS — R5381 Other malaise: Secondary | ICD-10-CM | POA: Insufficient documentation

## 2014-03-07 ENCOUNTER — Ambulatory Visit: Payer: Medicaid Other | Attending: Family Medicine | Admitting: Occupational Therapy

## 2014-03-07 DIAGNOSIS — R269 Unspecified abnormalities of gait and mobility: Secondary | ICD-10-CM | POA: Insufficient documentation

## 2014-03-07 DIAGNOSIS — M6281 Muscle weakness (generalized): Secondary | ICD-10-CM | POA: Diagnosis not present

## 2014-03-07 DIAGNOSIS — R5381 Other malaise: Secondary | ICD-10-CM | POA: Diagnosis not present

## 2014-03-07 DIAGNOSIS — Z5189 Encounter for other specified aftercare: Secondary | ICD-10-CM | POA: Insufficient documentation

## 2014-03-07 DIAGNOSIS — Q8711 Prader-Willi syndrome: Secondary | ICD-10-CM | POA: Insufficient documentation

## 2014-03-09 ENCOUNTER — Other Ambulatory Visit: Payer: Self-pay | Admitting: Family Medicine

## 2014-03-11 ENCOUNTER — Ambulatory Visit: Payer: Medicaid Other | Admitting: Physical Therapy

## 2014-03-14 ENCOUNTER — Ambulatory Visit: Payer: Medicaid Other | Admitting: Physical Therapy

## 2014-03-14 ENCOUNTER — Ambulatory Visit: Payer: Medicaid Other | Admitting: Occupational Therapy

## 2014-03-14 DIAGNOSIS — Z5189 Encounter for other specified aftercare: Secondary | ICD-10-CM | POA: Diagnosis not present

## 2014-03-15 ENCOUNTER — Ambulatory Visit: Payer: Medicaid Other | Admitting: Occupational Therapy

## 2014-03-15 ENCOUNTER — Ambulatory Visit: Payer: Medicaid Other | Admitting: Physical Therapy

## 2014-03-16 ENCOUNTER — Telehealth: Payer: Self-pay | Admitting: Family Medicine

## 2014-03-16 ENCOUNTER — Ambulatory Visit: Payer: Medicaid Other | Admitting: Physical Therapy

## 2014-03-16 DIAGNOSIS — Z5189 Encounter for other specified aftercare: Secondary | ICD-10-CM | POA: Diagnosis not present

## 2014-03-16 NOTE — Telephone Encounter (Signed)
Mother called and needs a letter for her son to be seen at the dentist office. Her son was there today and the dentist would not see him because his BP was high and seemed to be in respiratory distress. So the mother would like a letter stating that he can be seen and also that they can use novocain to numb his gums if need be. Please call the mother if you have any question and also when the letter is ready for pickup. jw

## 2014-03-17 ENCOUNTER — Ambulatory Visit: Payer: Medicaid Other | Admitting: Occupational Therapy

## 2014-03-17 DIAGNOSIS — Z5189 Encounter for other specified aftercare: Secondary | ICD-10-CM | POA: Diagnosis not present

## 2014-03-22 ENCOUNTER — Ambulatory Visit: Payer: Medicaid Other | Admitting: Occupational Therapy

## 2014-03-22 ENCOUNTER — Ambulatory Visit: Payer: Medicaid Other | Admitting: Physical Therapy

## 2014-03-22 DIAGNOSIS — Z5189 Encounter for other specified aftercare: Secondary | ICD-10-CM | POA: Diagnosis not present

## 2014-03-23 ENCOUNTER — Ambulatory Visit: Payer: Medicaid Other | Admitting: Physical Therapy

## 2014-03-23 ENCOUNTER — Ambulatory Visit: Payer: Medicaid Other | Admitting: Occupational Therapy

## 2014-03-23 DIAGNOSIS — Z5189 Encounter for other specified aftercare: Secondary | ICD-10-CM | POA: Diagnosis not present

## 2014-03-24 NOTE — Telephone Encounter (Signed)
If the patient was in respiratory distress while at the dentist then he should have been evaluated in the office following this. It is understandable that they would not see him with elevated blood pressure and I will write a letter stating that they should wait and recheck his blood pressure after letting him sit there as it usually comes down to a normal level after several minutes. Though I can not write a letter stating that it is ok for him to have his dental care if he is in respiratory distress. If this recurs he should be seen in the office. I have written a letter outlining the above. Please inform his mother of this. Thanks.

## 2014-03-25 NOTE — Telephone Encounter (Signed)
Mother is aware of letter and will come by to pick it up.  States that patient was having his normal heavy breathing while at the dentist and that his bp was 125/82.  She informed dentist that all this is normal for the patient but he stated that it was still a concern.  Also she states that pt has a bruise and pain in his foot.  He has been doing PT and started complaining earlier in the week of pain, but they claim it's normal with "stretching" his foot.  Mother is aware to elevate and ice foot and if no improvement bring patient in next week for evaluation.  Danyla Wattley,CMA

## 2014-03-28 ENCOUNTER — Ambulatory Visit: Payer: Medicaid Other | Admitting: Occupational Therapy

## 2014-03-28 ENCOUNTER — Ambulatory Visit: Payer: Medicaid Other | Admitting: Physical Therapy

## 2014-03-31 ENCOUNTER — Ambulatory Visit: Payer: Medicaid Other | Admitting: Physical Therapy

## 2014-03-31 ENCOUNTER — Encounter: Payer: Self-pay | Admitting: Family Medicine

## 2014-03-31 ENCOUNTER — Ambulatory Visit: Payer: Medicaid Other | Admitting: Occupational Therapy

## 2014-03-31 DIAGNOSIS — Z5189 Encounter for other specified aftercare: Secondary | ICD-10-CM | POA: Diagnosis not present

## 2014-03-31 NOTE — Progress Notes (Signed)
Mother dropped off form to be filled out for medication to be given at school.  Please call her when completed.   °

## 2014-04-01 NOTE — Progress Notes (Signed)
Clinic portion completed and placed in MD box. Fleeger, Maryjo Rochester

## 2014-04-04 ENCOUNTER — Ambulatory Visit: Payer: Medicaid Other | Admitting: Physical Therapy

## 2014-04-04 ENCOUNTER — Ambulatory Visit: Payer: Medicaid Other | Admitting: Occupational Therapy

## 2014-04-04 DIAGNOSIS — Z5189 Encounter for other specified aftercare: Secondary | ICD-10-CM | POA: Diagnosis not present

## 2014-04-07 ENCOUNTER — Ambulatory Visit: Payer: Medicaid Other | Attending: Family Medicine | Admitting: Occupational Therapy

## 2014-04-07 ENCOUNTER — Ambulatory Visit: Payer: Medicaid Other | Admitting: Physical Therapy

## 2014-04-07 DIAGNOSIS — Z5189 Encounter for other specified aftercare: Secondary | ICD-10-CM | POA: Diagnosis present

## 2014-04-07 DIAGNOSIS — M6281 Muscle weakness (generalized): Secondary | ICD-10-CM | POA: Insufficient documentation

## 2014-04-07 DIAGNOSIS — R269 Unspecified abnormalities of gait and mobility: Secondary | ICD-10-CM | POA: Insufficient documentation

## 2014-04-07 DIAGNOSIS — Q8711 Prader-Willi syndrome: Secondary | ICD-10-CM | POA: Diagnosis not present

## 2014-04-07 DIAGNOSIS — R5381 Other malaise: Secondary | ICD-10-CM | POA: Diagnosis not present

## 2014-04-13 NOTE — Progress Notes (Signed)
Patient ID: [name], male   DOB: [birth year], 18 y.o.   MRN: [id number] Form filled out and returned to [facility].

## 2014-04-13 NOTE — Progress Notes (Signed)
Pt's mom informed that form is completed and ready for pick up.  Clovis Pu, RN

## 2014-04-14 ENCOUNTER — Other Ambulatory Visit: Payer: Self-pay | Admitting: Family Medicine

## 2014-04-19 ENCOUNTER — Ambulatory Visit: Payer: Medicaid Other | Admitting: Physical Therapy

## 2014-04-19 DIAGNOSIS — Z5189 Encounter for other specified aftercare: Secondary | ICD-10-CM | POA: Diagnosis not present

## 2014-05-19 ENCOUNTER — Other Ambulatory Visit: Payer: Self-pay | Admitting: Family Medicine

## 2014-05-19 ENCOUNTER — Telehealth: Payer: Self-pay | Admitting: Family Medicine

## 2014-05-19 NOTE — Telephone Encounter (Signed)
Mother requesting new tubing and mask for pt's nebulizer. [facility].

## 2014-05-20 MED ORDER — NEBULIZER/ADULT MASK KIT: 1.0000 | PACK | Freq: Once | Status: AC

## 2014-05-20 NOTE — Telephone Encounter (Signed)
Order placed and to fax button selected [facility].

## 2014-07-04 ENCOUNTER — Other Ambulatory Visit: Payer: Self-pay | Admitting: Family Medicine

## 2014-08-13 ENCOUNTER — Other Ambulatory Visit: Payer: Self-pay | Admitting: Family Medicine

## 2014-08-15 NOTE — Telephone Encounter (Signed)
LMOVM for mom to return call. Fleeger, Jessica Dawn  

## 2014-08-15 NOTE — Telephone Encounter (Signed)
Refills given. Please inform the patients mother that he needs to come in for follow-up of his blood pressure some time in the next 1-2 months. Thanks.

## 2014-09-01 ENCOUNTER — Telehealth: Payer: Self-pay | Admitting: Family Medicine

## 2014-09-01 NOTE — Telephone Encounter (Signed)
Patient's Mother dropped Form to be completed and signed by PCP. Please, follow up with Ms. Joshua PrinceBonita Mckinney.

## 2014-09-02 NOTE — Telephone Encounter (Signed)
Clinic portion completed and placed in MD box for review and signature.[name], [name] RochesterJessica Dawn

## 2014-09-05 NOTE — Telephone Encounter (Signed)
Pt's mom informed that form is complete and ready for pick up. Ravenne Wayment L, RN    

## 2014-09-13 ENCOUNTER — Other Ambulatory Visit: Payer: Self-pay | Admitting: Family Medicine

## 2014-09-14 ENCOUNTER — Telehealth: Payer: Self-pay | Admitting: *Deleted

## 2014-09-14 NOTE — Telephone Encounter (Signed)
Pt called requesting appt for this afternoon.  Advised that we were full at this time.  Per mom pt has been c/o SOB and headache at school today, so she is requesting appt first thing in the am, advised pt should be seen sooner for that and mom is agreeable.  Appt given but mom is going to pick him up now and she will take to ED or UC if she feels that he cant wait till the am. Ariauna Farabee, Maryjo RochesterJessica Dawn

## 2014-09-15 ENCOUNTER — Inpatient Hospital Stay (HOSPITAL_COMMUNITY)
Admission: EM | Admit: 2014-09-15 | Discharge: 2014-09-18 | DRG: 189 | Disposition: A | Discharge status: Home / Self Care | Payer: Medicaid Other | Attending: Family Medicine | Admitting: Family Medicine

## 2014-09-15 ENCOUNTER — Ambulatory Visit (INDEPENDENT_AMBULATORY_CARE_PROVIDER_SITE_OTHER): Payer: Medicaid Other | Admitting: Family Medicine

## 2014-09-15 ENCOUNTER — Encounter (HOSPITAL_COMMUNITY): Payer: Self-pay | Admitting: Emergency Medicine

## 2014-09-15 ENCOUNTER — Emergency Department (HOSPITAL_COMMUNITY): Payer: Medicaid Other

## 2014-09-15 ENCOUNTER — Other Ambulatory Visit: Payer: Self-pay

## 2014-09-15 VITALS — BP 151/78 | HR 131 | Temp 98.4°F | Ht 59.0 in | Wt 391.3 lb

## 2014-09-15 DIAGNOSIS — J45909 Unspecified asthma, uncomplicated: Secondary | ICD-10-CM | POA: Diagnosis present

## 2014-09-15 DIAGNOSIS — R802 Orthostatic proteinuria, unspecified: Secondary | ICD-10-CM

## 2014-09-15 DIAGNOSIS — I313 Pericardial effusion (noninflammatory): Secondary | ICD-10-CM | POA: Diagnosis present

## 2014-09-15 DIAGNOSIS — E662 Morbid (severe) obesity with alveolar hypoventilation: Secondary | ICD-10-CM | POA: Diagnosis present

## 2014-09-15 DIAGNOSIS — I7300 Raynaud's syndrome without gangrene: Secondary | ICD-10-CM

## 2014-09-15 DIAGNOSIS — Q871 Congenital malformation syndromes predominantly associated with short stature: Secondary | ICD-10-CM

## 2014-09-15 DIAGNOSIS — R0602 Shortness of breath: Secondary | ICD-10-CM | POA: Diagnosis present

## 2014-09-15 DIAGNOSIS — R0689 Other abnormalities of breathing: Secondary | ICD-10-CM | POA: Insufficient documentation

## 2014-09-15 DIAGNOSIS — R51 Headache: Secondary | ICD-10-CM | POA: Diagnosis present

## 2014-09-15 DIAGNOSIS — J9690 Respiratory failure, unspecified, unspecified whether with hypoxia or hypercapnia: Secondary | ICD-10-CM | POA: Diagnosis present

## 2014-09-15 DIAGNOSIS — R0600 Dyspnea, unspecified: Secondary | ICD-10-CM | POA: Insufficient documentation

## 2014-09-15 DIAGNOSIS — L304 Erythema intertrigo: Secondary | ICD-10-CM

## 2014-09-15 DIAGNOSIS — J9601 Acute respiratory failure with hypoxia: Secondary | ICD-10-CM

## 2014-09-15 DIAGNOSIS — H547 Unspecified visual loss: Secondary | ICD-10-CM | POA: Diagnosis present

## 2014-09-15 DIAGNOSIS — R0902 Hypoxemia: Secondary | ICD-10-CM | POA: Insufficient documentation

## 2014-09-15 DIAGNOSIS — Q600 Renal agenesis, unilateral: Secondary | ICD-10-CM

## 2014-09-15 DIAGNOSIS — I272 Other secondary pulmonary hypertension: Secondary | ICD-10-CM | POA: Diagnosis present

## 2014-09-15 DIAGNOSIS — R32 Unspecified urinary incontinence: Secondary | ICD-10-CM | POA: Diagnosis present

## 2014-09-15 DIAGNOSIS — Q8711 Prader-Willi syndrome: Secondary | ICD-10-CM

## 2014-09-15 DIAGNOSIS — J9621 Acute and chronic respiratory failure with hypoxia: Principal | ICD-10-CM | POA: Diagnosis present

## 2014-09-15 DIAGNOSIS — E1169 Type 2 diabetes mellitus with other specified complication: Secondary | ICD-10-CM

## 2014-09-15 DIAGNOSIS — R635 Abnormal weight gain: Secondary | ICD-10-CM

## 2014-09-15 DIAGNOSIS — N3944 Nocturnal enuresis: Secondary | ICD-10-CM

## 2014-09-15 DIAGNOSIS — I10 Essential (primary) hypertension: Secondary | ICD-10-CM | POA: Diagnosis present

## 2014-09-15 HISTORY — DX: Disorder of kidney and ureter, unspecified: N28.9

## 2014-09-15 HISTORY — DX: Raynaud's syndrome without gangrene: I73.00

## 2014-09-15 LAB — URINE MICROSCOPIC-ADD ON

## 2014-09-15 LAB — CBC WITH DIFFERENTIAL/PLATELET
Basophils Absolute: 0.1 10*3/uL (ref 0.0–0.1)
Basophils Relative: 0 % (ref 0–1)
EOS ABS: 0.2 10*3/uL (ref 0.0–0.7)
Eosinophils Relative: 2 % (ref 0–5)
HEMATOCRIT: 40 % (ref 39.0–52.0)
HEMOGLOBIN: 12.1 g/dL — AB (ref 13.0–17.0)
Lymphocytes Relative: 16 % (ref 12–46)
Lymphs Abs: 1.9 10*3/uL (ref 0.7–4.0)
MCH: 23.6 pg — AB (ref 26.0–34.0)
MCHC: 30.3 g/dL (ref 30.0–36.0)
MCV: 78.1 fL (ref 78.0–100.0)
MONOS PCT: 6 % (ref 3–12)
Monocytes Absolute: 0.7 10*3/uL (ref 0.1–1.0)
Neutro Abs: 8.6 10*3/uL — ABNORMAL HIGH (ref 1.7–7.7)
Neutrophils Relative %: 76 % (ref 43–77)
Platelets: 246 10*3/uL (ref 150–400)
RBC: 5.12 MIL/uL (ref 4.22–5.81)
RDW: 16.4 % — ABNORMAL HIGH (ref 11.5–15.5)
WBC: 11.4 10*3/uL — ABNORMAL HIGH (ref 4.0–10.5)

## 2014-09-15 LAB — URINALYSIS, ROUTINE W REFLEX MICROSCOPIC
Bilirubin Urine: NEGATIVE
GLUCOSE, UA: NEGATIVE mg/dL
Hgb urine dipstick: NEGATIVE
Ketones, ur: NEGATIVE mg/dL
LEUKOCYTES UA: NEGATIVE
NITRITE: NEGATIVE
Protein, ur: 100 mg/dL — AB
SPECIFIC GRAVITY, URINE: 1.015 (ref 1.005–1.030)
Urobilinogen, UA: 1 mg/dL (ref 0.0–1.0)
pH: 6 (ref 5.0–8.0)

## 2014-09-15 LAB — I-STAT ARTERIAL BLOOD GAS, ED
Acid-Base Excess: 3 mmol/L — ABNORMAL HIGH (ref 0.0–2.0)
Bicarbonate: 31.1 mEq/L — ABNORMAL HIGH (ref 20.0–24.0)
O2 Saturation: 86 %
PH ART: 7.314 — AB (ref 7.350–7.450)
TCO2: 33 mmol/L (ref 0–100)
pCO2 arterial: 61.3 mmHg (ref 35.0–45.0)
pO2, Arterial: 58 mmHg — ABNORMAL LOW (ref 80.0–100.0)

## 2014-09-15 LAB — COMPREHENSIVE METABOLIC PANEL
ALK PHOS: 129 U/L — AB (ref 39–117)
ALT: 19 U/L (ref 0–53)
AST: 28 U/L (ref 0–37)
Albumin: 3.1 g/dL — ABNORMAL LOW (ref 3.5–5.2)
Anion gap: 10 (ref 5–15)
BUN: 29 mg/dL — ABNORMAL HIGH (ref 6–23)
CALCIUM: 8.6 mg/dL (ref 8.4–10.5)
CO2: 28 mmol/L (ref 19–32)
Chloride: 101 mmol/L (ref 96–112)
Creatinine, Ser: 1.02 mg/dL (ref 0.50–1.35)
GLUCOSE: 138 mg/dL — AB (ref 70–99)
POTASSIUM: 5.1 mmol/L (ref 3.5–5.1)
Sodium: 139 mmol/L (ref 135–145)
Total Bilirubin: 0.7 mg/dL (ref 0.3–1.2)
Total Protein: 7.8 g/dL (ref 6.0–8.3)

## 2014-09-15 LAB — I-STAT CHEM 8, ED
BUN: 38 mg/dL — AB (ref 6–23)
CALCIUM ION: 1.08 mmol/L — AB (ref 1.12–1.23)
CREATININE: 1 mg/dL (ref 0.50–1.35)
Chloride: 100 mmol/L (ref 96–112)
GLUCOSE: 131 mg/dL — AB (ref 70–99)
HCT: 44 % (ref 39.0–52.0)
Hemoglobin: 15 g/dL (ref 13.0–17.0)
Potassium: 5 mmol/L (ref 3.5–5.1)
Sodium: 140 mmol/L (ref 135–145)
TCO2: 29 mmol/L (ref 0–100)

## 2014-09-15 LAB — BRAIN NATRIURETIC PEPTIDE: B NATRIURETIC PEPTIDE 5: 38.3 pg/mL (ref 0.0–100.0)

## 2014-09-15 MED ORDER — FLUTICASONE PROPIONATE 50 MCG/ACT NA SUSP
2.0000 | Freq: Every day | NASAL | Status: DC
  Administered 2014-09-15 – 2014-09-18 (×4): 2 via NASAL
  Filled 2014-09-15 (×2): qty 16

## 2014-09-15 MED ORDER — ACETAMINOPHEN 325 MG PO TABS: 650.0000 mg | ORAL_TABLET | Freq: Four times a day (QID) | ORAL | Status: DC | PRN

## 2014-09-15 MED ORDER — FLUTICASONE PROPIONATE HFA 44 MCG/ACT IN AERO
4.0000 | INHALATION_SPRAY | Freq: Two times a day (BID) | RESPIRATORY_TRACT | Status: DC
  Administered 2014-09-15 – 2014-09-18 (×6): 4 via RESPIRATORY_TRACT
  Filled 2014-09-15: qty 10.6

## 2014-09-15 MED ORDER — IBUPROFEN 200 MG PO TABS
600.0000 mg | ORAL_TABLET | Freq: Four times a day (QID) | ORAL | Status: DC | PRN
  Administered 2014-09-15 – 2014-09-17 (×4): 600 mg via ORAL
  Filled 2014-09-15 (×4): qty 3

## 2014-09-15 MED ORDER — MONTELUKAST SODIUM 10 MG PO TABS
10.0000 mg | ORAL_TABLET | Freq: Every day | ORAL | Status: DC
  Administered 2014-09-15 – 2014-09-17 (×3): 10 mg via ORAL
  Filled 2014-09-15 (×4): qty 1

## 2014-09-15 MED ORDER — IPRATROPIUM-ALBUTEROL 0.5-2.5 (3) MG/3ML IN SOLN
3.0000 mL | Freq: Four times a day (QID) | RESPIRATORY_TRACT | Status: DC
  Administered 2014-09-16 – 2014-09-18 (×9): 3 mL via RESPIRATORY_TRACT
  Filled 2014-09-15 (×9): qty 3

## 2014-09-15 MED ORDER — IPRATROPIUM-ALBUTEROL 0.5-2.5 (3) MG/3ML IN SOLN
3.0000 mL | RESPIRATORY_TRACT | Status: DC
  Administered 2014-09-15 (×3): 3 mL via RESPIRATORY_TRACT
  Filled 2014-09-15 (×2): qty 3

## 2014-09-15 MED ORDER — SODIUM CHLORIDE 0.9 % IJ SOLN
3.0000 mL | Freq: Two times a day (BID) | INTRAMUSCULAR | Status: DC
  Administered 2014-09-15 – 2014-09-18 (×6): 3 mL via INTRAVENOUS

## 2014-09-15 MED ORDER — ACETAMINOPHEN 500 MG PO TABS
1000.0000 mg | ORAL_TABLET | Freq: Four times a day (QID) | ORAL | Status: DC | PRN
  Administered 2014-09-16 – 2014-09-18 (×6): 1000 mg via ORAL
  Filled 2014-09-15 (×6): qty 2

## 2014-09-15 MED ORDER — OXYBUTYNIN CHLORIDE 5 MG PO TABS
5.0000 mg | ORAL_TABLET | Freq: Every day | ORAL | Status: DC
  Filled 2014-09-15: qty 1

## 2014-09-15 MED ORDER — ACETAMINOPHEN 325 MG PO TABS
650.0000 mg | ORAL_TABLET | Freq: Four times a day (QID) | ORAL | Status: DC | PRN
  Administered 2014-09-15: 650 mg via ORAL
  Filled 2014-09-15: qty 2

## 2014-09-15 MED ORDER — LORATADINE 10 MG PO TABS
10.0000 mg | ORAL_TABLET | Freq: Every day | ORAL | Status: DC
  Administered 2014-09-15 – 2014-09-18 (×4): 10 mg via ORAL
  Filled 2014-09-15 (×4): qty 1

## 2014-09-15 MED ORDER — ALBUTEROL SULFATE (2.5 MG/3ML) 0.083% IN NEBU
3.0000 mL | INHALATION_SOLUTION | RESPIRATORY_TRACT | Status: DC | PRN
  Administered 2014-09-18: 3 mL via RESPIRATORY_TRACT
  Filled 2014-09-15: qty 3

## 2014-09-15 MED ORDER — DESMOPRESSIN ACETATE 0.2 MG PO TABS
0.4000 mg | ORAL_TABLET | Freq: Every day | ORAL | Status: DC
  Administered 2014-09-15 – 2014-09-17 (×3): 0.4 mg via ORAL
  Filled 2014-09-15 (×4): qty 2

## 2014-09-15 MED ORDER — SODIUM CHLORIDE 0.9 % IJ SOLN: 3.0000 mL | INTRAMUSCULAR | Status: DC | PRN

## 2014-09-15 MED ORDER — ACETAMINOPHEN 650 MG RE SUPP: 650.0000 mg | Freq: Four times a day (QID) | RECTAL | Status: DC | PRN

## 2014-09-15 MED ORDER — IPRATROPIUM-ALBUTEROL 0.5-2.5 (3) MG/3ML IN SOLN
RESPIRATORY_TRACT | Status: AC
  Filled 2014-09-15: qty 3

## 2014-09-15 MED ORDER — ALBUTEROL SULFATE (2.5 MG/3ML) 0.083% IN NEBU
2.5000 mg | INHALATION_SOLUTION | Freq: Once | RESPIRATORY_TRACT | Status: AC
  Administered 2014-09-15: 2.5 mg via RESPIRATORY_TRACT

## 2014-09-15 MED ORDER — CETYLPYRIDINIUM CHLORIDE 0.05 % MT LIQD
7.0000 mL | Freq: Two times a day (BID) | OROMUCOSAL | Status: DC
  Administered 2014-09-15 – 2014-09-18 (×5): 7 mL via OROMUCOSAL

## 2014-09-15 MED ORDER — HEPARIN SODIUM (PORCINE) 5000 UNIT/ML IJ SOLN
5000.0000 [IU] | Freq: Three times a day (TID) | INTRAMUSCULAR | Status: DC
  Administered 2014-09-15 – 2014-09-18 (×8): 5000 [IU] via SUBCUTANEOUS
  Filled 2014-09-15 (×9): qty 1

## 2014-09-15 MED ORDER — SODIUM CHLORIDE 0.9 % IV SOLN: 250.0000 mL | INTRAVENOUS | Status: DC | PRN

## 2014-09-15 MED ORDER — IPRATROPIUM BROMIDE 0.02 % IN SOLN
0.5000 mg | Freq: Once | RESPIRATORY_TRACT | Status: AC
  Administered 2014-09-15: 0.5 mg via RESPIRATORY_TRACT

## 2014-09-15 MED ORDER — ALBUTEROL SULFATE HFA 108 (90 BASE) MCG/ACT IN AERS: 2.0000 | INHALATION_SPRAY | Freq: Four times a day (QID) | RESPIRATORY_TRACT | Status: DC | PRN

## 2014-09-15 MED ORDER — OXYBUTYNIN CHLORIDE 5 MG PO TABS
10.0000 mg | ORAL_TABLET | Freq: Every day | ORAL | Status: DC
  Administered 2014-09-15 – 2014-09-17 (×3): 10 mg via ORAL
  Filled 2014-09-15 (×4): qty 2

## 2014-09-15 NOTE — Addendum Note (Signed)
Addended by: Elenora GammaBRADSHAW, Jamael Hoffmann L on: 09/15/2014 09:19 AM   Modules accepted: Level of Service

## 2014-09-15 NOTE — ED Notes (Signed)
[facility], was seen there for N/V and SOB, staff concerned about pat's sats, 96% per EMS, 5mg  Alb given pta, denies pain, is A/O on arrival, was able to stand for EMS, VSS

## 2014-09-15 NOTE — Progress Notes (Signed)
Report received from ED RN for patient to be admitted into [id number]

## 2014-09-15 NOTE — Progress Notes (Signed)
NURSING PROGRESS NOTE  [name] [id number] Admission Data: 09/15/2014 3:45 PM Attending Provider: Sanjuana LettersWilliam Arthur Hensel, MD WUJ:[id number]:Sonnenberg, Minerva AreolaEric, MD Code Status: FULL   [name] is a 19 y.o. male patient admitted from ED:  -No acute distress noted.  -No complaints of shortness of breath.  -No complaints of chest pain.   Cardiac Monitoring: Box # 04 in place. Cardiac monitor yields:sinus tachycardia.  Blood pressure 147/87, pulse 80, temperature 98.4 F (36.9 C), temperature source Oral, resp. rate 20, SpO2 91 %.   IV Fluids:  IV in place, occlusive dsg intact without redness, IV cath antecubital right, condition patent and no redness   Allergies:  Review of patient's allergies indicates no known allergies.  Past Medical History:   has a past medical history of Asthma; Prader-Willi syndrome; Obesity; Obesity hypoventilation syndrome; and Sleep apnea.  Past Surgical History:   has past surgical history that includes Nissen fundoplication.   Skin: intact  Patient/Family orientated to room. Information packet given to patient/family. Admission inpatient armband information verified with patient/family to include name and date of birth and placed on patient arm. Side rails up x 2, fall assessment and education completed with patient/family. Patient/family able to verbalize understanding of risk associated with falls and verbalized understanding to call for assistance before getting out of bed. Call light within reach. Patient/family able to voice and demonstrate understanding of unit orientation instructions.    Will continue to evaluate and treat per MD orders.  Cathlyn Parsonsattha Rozell Kettlewell, RN

## 2014-09-15 NOTE — Progress Notes (Signed)
Patient ID: [name], male   DOB: 06-27-1996, 19 y.o.   MRN: [id number]   HPI  Patient presents today for shortness of breath and headache.  Mother expensive symptoms began about 2 weeks ago whenever his BiPAP mask was changed. He did not like the way that it worked, and did not seem to feel very well. He normally uses 4 L of oxygen and BiPAP every night. Does not use oxygen at baseline.  His mother explains that over last 4 days his dyspnea seemed to steadily worsen. It seems exertional and he is to stop and take breaths after walking just 2-3 steps. He also complains of a headache and as her Tylenol this morning. He has had a headache over the last 2 days. When asked directly if they states his head does not hurt, however his mother states that he is not saying because he's scared.  ROS: Per HPI  Objective: BP 151/78 mmHg  Pulse 131  Temp(Src) 98.4 F (36.9 C) (Oral)  Ht 4\' 11"  (1.499 m)  Wt 391 lb 4.8 oz (177.493 kg)  BMI 78.99 kg/m2  SpO2 58% Gen: NAD, alert, cooperative with exam HEENT: NCAT CV: Tachycardia, no murmur Resp: Very poor air movement only from mid scapula up, moderate increased work of breathing, no wheezes or added sounds Ext: No edema, warm Neuro: Alert and oriented, No gross deficits  Of note patient has renal syndrome has erythematous fingers bilaterally this may be affecting his oxygen saturation  Assessment and plan:  Acute respiratory failure with hypoxia Several days of progressive dyspnea today with oxygen saturation at 58%, 65% after sitting for several minutes, 88% after starting 2 L of oxygen by nasal cannula. Patient has history of Prader-Willi syndrome, obesity Relation syndrome, and asthma. No wheezes on exam however has very poor air movement and may not be moving enough air to hear wheezes. Given presentation, low oxygen saturations, and medical complexity a feeling needs emergent evaluation with close follow-up we cannot provide in this  clinic. Discussed his case with charge nurse in the ER who is expecting his arrival, patient transferred to the ER via ambulance.     Meds ordered this encounter  Medications  . albuterol (PROVENTIL) (2.5 MG/3ML) 0.083% nebulizer solution 2.5 mg    Sig:   . ipratropium (ATROVENT) nebulizer solution 0.5 mg    Sig:

## 2014-09-15 NOTE — H&P (Signed)
[facility] Admission History and Physical Service Pager: [phone]  Patient name: [name] Medical record number: [id number] Date of birth: [birth year] Age: 19 y.o. Gender: male  Primary Care Provider: Tommi Rumps, MD Consultants: None Code Status: Full  Chief Complaint: Shortness of Breath  Assessment and Plan: RAIN FRIEDT is a 19 y.o. male presenting with shortness of breath and hypoxia to 55% from clinic. PMH is significant for Prader-Willi syndrome, obesity hypoventilation syndrome, sleep apnea, Raynaud's phenomenon, hypertension, asthma, enuresis, congenital absence kidney.  # Hypoxia - found to be hypoxic to 55% in the clinic today. During the setting of recent inability to adequately use his CPAP due to mask discomfort and an adequate seal. A shunt not tolerating CPAP at home. Also started with cough today. History of asthma, on baseline controller medication. No fevers, chills, chest pain, nausea, vomiting. Mild increase in WBC without left shift, chest x-ray poor quality due to hypoventilation. Unable to clearly see the bases. CMP unremarkable. ABG with hypercarbia to 61, compensated with a pH of 7.314 and bicarbonate of 31.1. BNP of 38.3 - Admitted to telemetry under Dr. Andria Frames - We'll schedule duonebs every 4 hours of admission and wean as tolerated - Consult care management to address home CPAP situation - O2 as needed to keep oxygen saturation greater than 90% - We will wean oxygen as tolerated, and as patient improves. - Repeat chest x-ray in a.m. - Low threshold to consider pneumonia given sedentary risk factors. Afebrile at this time. - Incentive spirometry - Consider echocardiogram given the likelihood of pulmonary hypertension with OSA and questionable cardiac effusion on chest x-ray though film is very poor quality. - Continue prn albuterol - Continue Zyrtec - Continue Qvar 80 g 4 puffs twice a day - BiPAP at night - Continue  Singulair 10 mg daily - 10 D Flonase daily  # Headache-  with headache over the last several days per mom. This is occurring in the setting of increasing respiratory distress and hypoxia. Most likely related to hypercarbia and hypoxic state. Headache resolves with Tylenol. Mom has been giving Tylenol at home. - We'll resume Tylenol here. - Suspect headache will improve with improvement in hypoxia.   # Hypertension - normotensive here. Has been taking Prinzide at home with good control. Continue medications here. - Lisinopril 20 mg daily - HCTZ 12.5 mg daily  # Enuresis - patient with history of enuresis. Will continue home medications here.  - Continue to [place] 5 mg twice daily  - Continue desmopressin 0.4 mg at bedtime  FEN/GI: Low-calorie diet will consult nutrition. Patient on 1200-calorie restriction. Prophylaxis: Heparin subcutaneous  Disposition: Pending improvement in respiratory status and ability to get home BiPAP supplies.  History of Present Illness: [name] is a 19 y.o. male presenting to clinic today with increasing shortness of breath and work of breathing in the setting of having had difficulty with a new BiPAP mask over the last week. Patient has not been tolerating BiPAP at night due to a new mask that was sent to them 1 week ago. Has a poor seal for his face, and has been leaking at night. Mom reports that she tried to get a replacement mask, but was unable to due to [facility] not having the previous mask in stock whenever she went to meet with the respiratory therapist there. He has had a cough 1 day, but otherwise no nasal congestion, no cough productive of sputum, no fevers, no chills, no  nausea, no vomiting. No chest pain. Is able to eat up and move around his own, has been more sedentary lately due to feeling bad. Mom says that he has had a headache intermittently over the last week that is resolved with Tylenol. In clinic today, he was found to be hypoxic to  55% with little improvement. It is not clear whether the oxygen reading was taken on his distal upper extremities which are also affected by Raynaud's phenomenon and have poor circulation during episodes of vasoconstriction. However, in the ED he was found to be hypoxic with ear lobe pulse ox reading down to the mid to upper 80s on 1-2 L of oxygen.  In the ED is vital signs have been stable. He has been able to maintain oxygen saturations on 1-2 L to the mid 90s. He is slightly tachycardic to the low 100s. Blood pressure stable, and respiratory rate is normal. Initial lab work reveals hypercarbia to 60s on ABG that is compensated with elevated bicarbonate 31. And pH of 7.31. He improved with albuterol treatments.  Review Of Systems: Per HPI with the following additions: none Otherwise 12 point review of systems was performed and was unremarkable.  Patient Active Problem List   Diagnosis Date Noted  . Acute respiratory failure with hypoxia 09/15/2014  . Weight gain 07/02/2013  . Intertrigo 06/30/2013  . Allergic rhinitis 01/07/2013  . Obesity hypoventilation syndrome 10/05/2010  . VISUAL IMPAIRMENT 11/13/2009  . RAYNAUD'S SYNDROME 08/10/2009  . Essential hypertension, benign 01/07/2008  . ORTHOSTATIC PROTEINURIA 01/07/2008  . OBESITY, MORBID 02/27/2007  . 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 ASTHMA, PERSISTENT 10/16/2006  . SYMPTOM, ENURESIS, NOCTURNAL 10/16/2006   Past Medical History: Past Medical History  Diagnosis Date  . Asthma   . Prader-Willi syndrome   . Obesity   . Obesity hypoventilation syndrome   . Sleep apnea    Past Surgical History: Past Surgical History  Procedure Laterality Date  . Nissen fundoplication     Social History: History  Substance Use Topics  . Smoking status: Never Smoker   . Smokeless tobacco: Never Used  . Alcohol Use: No   Additional social history: none  Please also refer to relevant sections of EMR.  Family History: No family history on  file. Allergies and Medications: No Known Allergies No current facility-administered medications on file prior to encounter.   Current Outpatient Prescriptions on File Prior to Encounter  Medication Sig Dispense Refill  . albuterol (PROVENTIL) (2.5 MG/3ML) 0.083% nebulizer solution INHALE 1 VIAL IN NEBULIZER ONCE DAILY (Patient taking differently: Take 2.5 mg by nebulization 2 (two) times daily as needed for wheezing or shortness of breath. INHALE 1 VIAL IN NEBULIZER ONCE DAILY) 75 mL 12  . cetirizine (ZYRTEC) 10 MG tablet take 1 tablet by mouth once daily 30 tablet 4  . desmopressin (DDAVP) 0.2 MG tablet Take 0.4 mg by mouth at bedtime.     . fluticasone (FLONASE) 50 MCG/ACT nasal spray instill 2 sprays into each nostril once daily 16 g 6  . lisinopril-hydrochlorothiazide (PRINZIDE,ZESTORETIC) 20-12.5 MG per tablet take 1 tablet by mouth once daily 30 tablet 2  . montelukast (SINGULAIR) 10 MG tablet take 1 tablet by mouth at bedtime 90 tablet 3  . mupirocin ointment (BACTROBAN) 2 % APPLY TO AFECTED AREA 3 TIMES A DAY FOR 7 DAYS 22 g 0  . nystatin (MYCOSTATIN/NYSTOP) 100000 UNIT/GM POWD APPLY TO AREA WITHIN SKIN FOLDS TWO TIMES A DAY WHEN THERE IS REDNESS OR IRRITATION PRESENT 30 g 3  .  nystatin-triamcinolone (MYCOLOG II) cream Apply 1 application topically 2 (two) times daily. 30 g 0  . oxybutynin (DITROPAN) 5 MG tablet Take 1 tablet (5 mg total) by mouth at bedtime. (Patient taking differently: Take 5 mg by mouth 2 (two) times daily. ) 30 tablet 3  . QVAR 80 MCG/ACT inhaler inhale 4 puffs twice a day 26.1 g 12  . clotrimazole (LOTRIMIN) 1 % cream apply to affected area twice a day (Patient not taking: Reported on 09/15/2014) 30 g 0  . Misc. Devices MISC Please provide a pulse oximeter for the ear lobe. Patient has diagnosis of Raynaud, asthma 1 each 0  . PROAIR HFA 108 (90 BASE) MCG/ACT inhaler inhale 2 puffs every 6 hours if needed 8.5 g 2  . Respiratory Therapy Supplies (NEBULIZER/ADULT MASK)  KIT 1 Device by Does not apply route once. 1 each 0  . Respiratory Therapy Supplies MISC BiPap, titration setting 20/15 1 each 0  . Spacer/Aero-Holding Chambers (AEROCHAMBER MV) inhaler Use with albuterol inhaler 1 each 1    Objective: BP 131/83 mmHg  Pulse 107  Temp(Src) 99.1 F (37.3 C) (Oral)  Resp 29  SpO2 90% Exam: General: NAD, AAOx3, Obesity Class III HEENT: NCAT, PERRLA, EOMI, No LAD, Flat nasal bridge, wide nostrils, Low set ears, Short neck difficult to examine, Lips cracked (patient admits to biting them),  Cardiovascular: RRR, no MGR, Nl S1/S2, 2+ distal pulses. Obese extremities, difficult to assess fluid status.  Respiratory: Poor air movement, Distant breath sounds, Air movement in all lung fields which is better than at clinic per previous examination, No crackles, no wheezes audible but difficult to tell due to distant breath sounds, No rales. Slightly labored, not tachypneic.  Abdomen: S, NT, ND, Large pannus, +BS Extremities: MAEW, Difficult to examine due to obesity, no Edema evident, 2+ distal pulses.  Skin: No lesions, Skin folds examined and without breakdown at this time, No rashes  Neuro: No focal deficits.   Labs and Imaging: CBC BMET   Recent Labs Lab 09/15/14 1020 09/15/14 1048  WBC 11.4*  --   HGB 12.1* 15.0  HCT 40.0 44.0  PLT 246  --     Recent Labs Lab 09/15/14 1020 09/15/14 1048  NA 139 140  K 5.1 5.0  CL 101 100  CO2 28  --   BUN 29* 38*  CREATININE 1.02 1.00  GLUCOSE 138* 131*  CALCIUM 8.6  --      Urinalysis    Component Value Date/Time   COLORURINE YELLOW 09/15/2014 1130   APPEARANCEUR CLEAR 09/15/2014 1130   LABSPEC 1.015 09/15/2014 1130   PHURINE 6.0 09/15/2014 1130   GLUCOSEU NEGATIVE 09/15/2014 1130   HGBUR NEGATIVE 09/15/2014 1130   HGBUR small 10/22/2006 1538   BILIRUBINUR NEGATIVE 09/15/2014 1130   KETONESUR NEGATIVE 09/15/2014 1130   PROTEINUR 100* 09/15/2014 1130   UROBILINOGEN 1.0 09/15/2014 1130   NITRITE  NEGATIVE 09/15/2014 1130   LEUKOCYTESUR NEGATIVE 09/15/2014 1130   BNP - 38.3  ABG - Co2 - 61.3, O2 - 58.0, Bicarb - 31.1, Ph - 7.314  Blood Cx x 2 - pending.    CXR 2/11:  IMPRESSION: 1. Cardiopericardial enlargement, pericardial effusion not excluded. 2. Severely limited evaluation of the lungs to patient factors. There is hypoventilation with grossly symmetric aeration.  Aquilla Hacker, MD 09/15/2014, 1:48 PM PGY-1, [place] [facility] pager: [phone], text pages welcome

## 2014-09-15 NOTE — Progress Notes (Signed)
Pt placed on bipap at 2200, resting comfortably, will continue to monitor.

## 2014-09-15 NOTE — Patient Instructions (Signed)
Please go to the emergency room for evaluation. 

## 2014-09-15 NOTE — ED Provider Notes (Signed)
Angiocath insertion Performed by: Dagmar HaitWALDEN, WILLIAM Jovany Disano  Consent: Verbal consent obtained. Risks and benefits: risks, benefits and alternatives were discussed Time out: Immediately prior to procedure a "time out" was called to verify the correct patient, procedure, equipment, support staff and site/side marked as required.  Preparation: Patient was prepped and draped in the usual sterile fashion.  Vein Location: R AC  YES Ultrasound Guided  Gauge: 20  Normal blood return and flush without difficulty Patient tolerance: Patient tolerated the procedure well with no immediate complications.     Elwin MochaBlair Sami Roes, MD 09/15/14 1101

## 2014-09-15 NOTE — ED Notes (Signed)
Report from dreama, rn.  Pt care assumed.

## 2014-09-15 NOTE — Assessment & Plan Note (Addendum)
Several days of progressive dyspnea today with oxygen saturation at 58%, 65% after sitting for several minutes, 88% after starting 2 L of oxygen by nasal cannula. Patient has history of Prader-Willi syndrome, obesity Relation syndrome, and asthma. No wheezes on exam however has very poor air movement and may not be moving enough air to hear wheezes. Given presentation, low oxygen saturations, and medical complexity a feeling needs emergent evaluation with close follow-up we cannot provide in this clinic. Discussed his case with charge nurse in the ER who is expecting his arrival, patient transferred to the ER via ambulance.  Of note he has been started on O2 and given a neb here, we did not give any steroids but do believe he would benefit.

## 2014-09-15 NOTE — Progress Notes (Signed)
I have seen and evaluated the patient with Dr. Ermalinda MemosBradshaw. I agree that the patient requires emergent intervention and should be transferred to the ED.   2L [place] and albuterol treatment provided in office while awaiting EMS.  Donnella ShamKyle Demaris Leavell MD

## 2014-09-16 ENCOUNTER — Inpatient Hospital Stay (HOSPITAL_COMMUNITY): Payer: Medicaid Other

## 2014-09-16 DIAGNOSIS — J9622 Acute and chronic respiratory failure with hypercapnia: Secondary | ICD-10-CM

## 2014-09-16 LAB — CBC
HEMATOCRIT: 37.1 % — AB (ref 39.0–52.0)
Hemoglobin: 11.6 g/dL — ABNORMAL LOW (ref 13.0–17.0)
MCH: 23.8 pg — ABNORMAL LOW (ref 26.0–34.0)
MCHC: 31.3 g/dL (ref 30.0–36.0)
MCV: 76 fL — ABNORMAL LOW (ref 78.0–100.0)
PLATELETS: 230 10*3/uL (ref 150–400)
RBC: 4.88 MIL/uL (ref 4.22–5.81)
RDW: 16.2 % — AB (ref 11.5–15.5)
WBC: 10.4 10*3/uL (ref 4.0–10.5)

## 2014-09-16 LAB — BASIC METABOLIC PANEL
Anion gap: 10 (ref 5–15)
BUN: 24 mg/dL — AB (ref 6–23)
CHLORIDE: 101 mmol/L (ref 96–112)
CO2: 27 mmol/L (ref 19–32)
CREATININE: 0.91 mg/dL (ref 0.50–1.35)
Calcium: 8.7 mg/dL (ref 8.4–10.5)
GFR calc Af Amer: >90 mL/min (ref >90)
GFR calc non Af Amer: >90 mL/min (ref >90)
Glucose, Bld: 94 mg/dL (ref 70–99)
POTASSIUM: 5 mmol/L (ref 3.5–5.1)
Sodium: 138 mmol/L (ref 135–145)

## 2014-09-16 LAB — HEMOGLOBIN A1C
HEMOGLOBIN A1C: 6.9 % — AB (ref 4.8–5.6)
MEAN PLASMA GLUCOSE: 151 mg/dL

## 2014-09-16 LAB — GLUCOSE, CAPILLARY: GLUCOSE-CAPILLARY: 148 mg/dL — AB (ref 70–99)

## 2014-09-16 NOTE — Progress Notes (Signed)
Family Medicine Teaching Service Daily Progress Note Intern Pager: [phone]  Patient name: [name] Medical record number: [id number] Date of birth: 10/02/1995 Age: 19 y.o. Gender: male  Primary Care Provider: Marikay Alar, MD Consultants: None Code Status: Full  Pt Overview and Major Events to Date:  2/11 - Pt. Admitted with hypoxia 2/2 acute on chronic respiratory failure.  2/12 - Continue to monitor O2 requirement and improvement in respiratory function.   Assessment and Plan: [name] is a 19 y.o. male presenting with shortness of breath and hypoxia to 55% from clinic. PMH is significant for Prader-Willi syndrome, obesity hypoventilation syndrome, sleep apnea, Raynaud's phenomenon, hypertension, asthma, enuresis, congenital absence kidney.  # Hypoxia - found to be hypoxic to 55% in the clinic in the setting of recent inability to adequately use his BiPAP due to mask discomfort and an adequate seal. Not tolerating BIpap at home. Also started with cough. . History of asthma, on baseline controller medication. No fevers, chills, chest pain, nausea, vomiting. Mild increase in WBC without left shift, chest x-ray poor quality due to hypoventilation. Unable to clearly see the bases. CMP unremarkable. ABG with hypercarbia to 61, compensated with a pH of 7.314 and bicarbonate of 31.1. BNP of 38.3 - Admitted to telemetry under Dr. Leveda Anna - Consider consulting pulmonology today as they follow him. - Repeat ABG today.   - Discussion of potential tracheostomy in the future given the worsening nature of his disease.  - Continue duonebs as patient feels that they help.  - Consult care management to address home CPAP situation - O2 as needed to keep oxygen saturation greater than 90% - We will wean oxygen as tolerated, and as patient improves. - Follow up repeat chest x-ray today.  - Low threshold to consider pneumonia given sedentary risk factors. Afebrile at this time. - Incentive  spirometry - follow up Echo today.  - Continue prn albuterol - Continue Zyrtec - Continue Qvar 80 g 4 puffs twice a day - BiPAP at night - Continue Singulair 10 mg daily - Flonase daily  # Headache- with headache over the last several days per mom. This is occurring in the setting of increasing respiratory distress and hypoxia. Most likely related to hypercarbia and hypoxic state. Headache resolves with Tylenol. Mom has been giving Tylenol at home. - tylenol prn.  - Suspect headache will improve with improvement in hypoxia and hypercarbia.   # Hypertension - normotensive here. Has been taking Prinzide at home with good control. Continue medications here. - Lisinopril 20 mg daily - HCTZ 12.5 mg daily  # Enuresis - patient with history of enuresis. Will continue home medications here.  - Continue to [place] 5 mg twice daily  - Continue desmopressin 0.4 mg at bedtime  FEN/GI: Low-calorie diet will consult nutrition. Patient on 1200-calorie restriction. Prophylaxis: Heparin subcutaneous  Disposition: Pending improvement in respiratory status and care management with Bipap supplies.   Subjective:  No acute events overnight. Now able to ambulate to the restroom without stopping to catch his breath. Upon further discussion with Mom, he has been sleeping face down in his pillow with the vent on his BIpap mask. Unsure of why this was more comfortable for him, but suggests that perhaps he was needing higher Bipap pressures than even prescribed at home. Otherwise afebrile, no chills, no nausea, no vomiting. Mom has bought a new bipap mask for home.   Objective: Temp:  [98.4 F (36.9 C)-99.1 F (37.3 C)] 98.9 F (37.2 C) (02/12  [id number]) Pulse Rate:  [80-131] 106 (02/12 0620) Resp:  [18-35] 24 (02/12 0620) BP: (129-158)/(77-94) 130/77 mmHg (02/12 0620) SpO2:  [58 %-99 %] 90 % (02/12 0620) Weight:  [391 lb 4.8 oz (177.493 kg)-400 lb (181.439 kg)] 400 lb (181.439 kg) (02/11 2110) Physical  Exam: General: NAD, Obese male Cardiovascular: RRR, No MGR, Normal S1/S2. Distant heart sounds 2/2 habitus  Respiratory: CTA B, somehat better air movement than yesterday, R> L today. No crackles, rales, or wheezes audible. [place] in place.  Abdomen: Large, S, NT, ND, +BS Extremities: Obese, difficult to evaluate fluid status, 2+ distal pulses.  Skin: No lesions, no rashes Neuro: Grossly intact.   Laboratory:  Recent Labs Lab 09/15/14 1020 09/15/14 1048  WBC 11.4*  --   HGB 12.1* 15.0  HCT 40.0 44.0  PLT 246  --     Recent Labs Lab 09/15/14 1020 09/15/14 1048  NA 139 140  K 5.1 5.0  CL 101 100  CO2 28  --   BUN 29* 38*  CREATININE 1.02 1.00  CALCIUM 8.6  --   PROT 7.8  --   BILITOT 0.7  --   ALKPHOS 129*  --   ALT 19  --   AST 28  --   GLUCOSE 138* 131*   BNP - 38.3  ABG - CO2 61.3, O2 - 58, Bicarb - 31.1, Ph - 7.314  Bld. Cx - pending.   Imaging/Diagnostic Tests:  CXR 2/11:  IMPRESSION: 1. Cardiopericardial enlargement, pericardial effusion not excluded. 2. Severely limited evaluation of the lungs to patient factors. There is hypoventilation with grossly symmetric aeration.  Yolande Jollyaleb G Stellah Donovan, MD 09/16/2014, 6:56 AM PGY-1 [facility] FPTS Intern pager: [phone], text pages welcome

## 2014-09-16 NOTE — Consult Note (Signed)
Name: [name] MRN: [id number] DOB: 1995-11-22    ADMISSION DATE:  09/15/2014 CONSULTATION DATE:  09/16/2013  REFERRING MD :  FPTS  CHIEF COMPLAINT:  Acute on chronic hypercarbic respiratory failure  BRIEF PATIENT DESCRIPTION: 19 year old male with history of Prader Willie syndrome with severe morbid obesity who has been dependent on BiPAP while asleep since age 71.  Patient had a recent change in his mask that was unintentional but then resulted in patient complaining of more headaches in the morning which is his usual complain when CO2 starts to climb.  Patient has been steadily gaining weight and pressure on BiPAP has been steadily increasing, now up to 20/15 with 4L of O2.  Patient reports to the hospital with increased headache and lethargy in the morning.  Patient is unable to provide history.  History is provided by mother.  Clearly denies cough, sputum production, fever, night sweats or sick contacts.  Does report however worsening SOB while asleep and lethargy in the morning.  SIGNIFICANT EVENTS  2/11 AMS with change in mask for BiPAP. 2/12 PCCM to evaluate for respiratory failure acute on chronic.  STUDIES:  CXR 2/12 poor quality due to size.  PAST MEDICAL HISTORY :   has a past medical history of Asthma; Prader-Willi syndrome; Obesity; Obesity hypoventilation syndrome; Sleep apnea; Raynaud's phenomenon; and Kidney dysfunction.  has past surgical history that includes Nissen fundoplication and Tonsillectomy. Prior to Admission medications   Medication Sig Start Date End Date Taking? Authorizing Provider  albuterol (PROVENTIL) (2.5 MG/3ML) 0.083% nebulizer solution INHALE 1 VIAL IN NEBULIZER ONCE DAILY Patient taking differently: Take 2.5 mg by nebulization 2 (two) times daily as needed for wheezing or shortness of breath. INHALE 1 VIAL IN NEBULIZER ONCE DAILY 02/07/14  Yes Leone Haven, MD  cetirizine (ZYRTEC) 10 MG tablet take 1 tablet by mouth once daily 08/15/14  Yes  Leone Haven, MD  desmopressin (DDAVP) 0.2 MG tablet Take 0.4 mg by mouth at bedtime.    Yes Historical Provider, MD  fluticasone (FLONASE) 50 MCG/ACT nasal spray instill 2 sprays into each nostril once daily 09/14/14  Yes Leone Haven, MD  lisinopril-hydrochlorothiazide [facility]) 20-12.5 MG per tablet take 1 tablet by mouth once daily 08/15/14  Yes Leone Haven, MD  montelukast (SINGULAIR) 10 MG tablet take 1 tablet by mouth at bedtime 04/15/14  Yes Leone Haven, MD  mupirocin ointment (BACTROBAN) 2 % APPLY TO AFECTED AREA 3 TIMES A DAY FOR 7 DAYS 12/15/13  Yes Leone Haven, MD  nystatin (MYCOSTATIN/NYSTOP) 100000 UNIT/GM POWD APPLY TO AREA WITHIN SKIN FOLDS TWO TIMES A DAY WHEN THERE IS REDNESS OR IRRITATION PRESENT 08/04/13  Yes Leone Haven, MD  nystatin-triamcinolone (MYCOLOG II) cream Apply 1 application topically 2 (two) times daily. 06/30/13  Yes Leone Haven, MD  oxybutynin (DITROPAN) 5 MG tablet Take 1 tablet (5 mg total) by mouth at bedtime. Patient taking differently: Take 10 mg by mouth at bedtime.  04/20/12  Yes Carolin Guernsey, MD  QVAR 80 MCG/ACT inhaler inhale 4 puffs twice a day 08/15/14  Yes Leone Haven, MD  clotrimazole (LOTRIMIN) 1 % cream apply to affected area twice a day Patient not taking: Reported on 09/15/2014 07/05/14   Leone Haven, MD  Misc. Devices MISC Please provide a pulse oximeter for the ear lobe. Patient has diagnosis of Raynaud, asthma 09/16/11   Judithann Sheen, MD  PROAIR HFA 108 [phone] BASE) MCG/ACT inhaler inhale  2 puffs every 6 hours if needed 02/16/14   Collene Gobble, MD  Respiratory Therapy Supplies (NEBULIZER/ADULT MASK) KIT 1 Device by Does not apply route once. 05/20/14   Leone Haven, MD  Respiratory Therapy Supplies MISC BiPap, titration setting 20/15 08/05/12   Carolin Guernsey, MD  Spacer/Aero-Holding Chambers (AEROCHAMBER MV) inhaler Use with albuterol inhaler 04/20/12   Carolin Guernsey, MD   No  Known Allergies  FAMILY HISTORY:  family history is not on file. SOCIAL HISTORY:  reports that he has never smoked. He has never used smokeless tobacco. He reports that he does not drink alcohol or use illicit drugs.  REVIEW OF SYSTEMS:   Constitutional: Negative for fever, chills, weight loss, malaise/fatigue and diaphoresis.  HENT: Negative for hearing loss, ear pain, nosebleeds, congestion, sore throat, neck pain, tinnitus and ear discharge.   Eyes: Negative for blurred vision, double vision, photophobia, pain, discharge and redness.  Respiratory: Negative for cough, hemoptysis, sputum production, shortness of breath, wheezing and stridor.   Cardiovascular: Negative for chest pain, palpitations, orthopnea, claudication, leg swelling and PND.  Gastrointestinal: Negative for heartburn, nausea, vomiting, abdominal pain, diarrhea, constipation, blood in stool and melena.  Genitourinary: Negative for dysuria, urgency, frequency, hematuria and flank pain.  Musculoskeletal: Negative for myalgias, back pain, joint pain and falls.  Skin: Negative for itching and rash.  Neurological: Negative for dizziness, tingling, tremors, sensory change, speech change, focal weakness, seizures, loss of consciousness, weakness and headaches.  Endo/Heme/Allergies: Negative for environmental allergies and polydipsia. Does not bruise/bleed easily.  SUBJECTIVE:   VITAL SIGNS: Temp:  [98.4 F (36.9 C)-98.9 F (37.2 C)] 98.9 F (37.2 C) (02/12 0620) Pulse Rate:  [80-117] 110 (02/12 1154) Resp:  [18-32] 24 (02/12 1154) BP: (130-158)/(77-87) 130/77 mmHg (02/12 0620) SpO2:  [90 %-95 %] 93 % (02/12 1154) Weight:  [181.439 kg (400 lb)] 181.439 kg (400 lb) (02/11 2110)  PHYSICAL EXAMINATION: General:  Morbidly obese male, in NAD. Neuro:  Awake, follows command, speaking at baseline according to mother, moving all ext to command.  Very obeses. HEENT:  /AT, PERRL, EOM-I and MMM. Cardiovascular:  RRR, Nl S1/S2,  -M/R/G. Lungs:  Very distant breath sounds, very difficult to auscultate. Abdomen:  Soft, NT, ND and +BS. Musculoskeletal:  -edema and -tenderness. Skin:  Intact.   Recent Labs Lab 09/15/14 1020 09/15/14 1048 09/16/14 0551  NA 139 140 138  K 5.1 5.0 5.0  CL 101 100 101  CO2 28  --  27  BUN 29* 38* 24*  CREATININE 1.02 1.00 0.91  GLUCOSE 138* 131* 94    Recent Labs Lab 09/15/14 1020 09/15/14 1048 09/16/14 0551  HGB 12.1* 15.0 11.6*  HCT 40.0 44.0 37.1*  WBC 11.4*  --  10.4  PLT 246  --  230   X-ray Chest Pa And Lateral  09/16/2014   CLINICAL DATA:  Dyspnea.  EXAM: CHEST  2 VIEW  COMPARISON:  September 15, 2014.  FINDINGS: Stable cardiomegaly. No pneumothorax is noted. Diffuse airspace opacities are noted throughout both lungs most consistent with pneumonia or possibly edema. No significant pleural effusion is noted. Bony thorax is intact.  IMPRESSION: Stable cardiomegaly and diffuse bilateral airspace opacities consistent with pneumonia or edema.   Electronically Signed   By: Marijo Conception, M.D.   On: 09/16/2014 10:54   Dg Chest Port 1 View  09/15/2014   CLINICAL DATA:  Increasing shortness of breath.  EXAM: PORTABLE CHEST - 1 VIEW  COMPARISON:  10/08/2010  FINDINGS: There is marked cardiopericardial enlargement. Diffuse hazy density of the chest which is at least partly explained by soft tissue attenuation and hypoventilation. There could also be pulmonary venous congestion, edema, or pneumonia.  IMPRESSION: 1. Cardiopericardial enlargement, pericardial effusion not excluded. 2. Severely limited evaluation of the lungs to patient factors. There is hypoventilation with grossly symmetric aeration.   Electronically Signed   By: Monte Fantasia M.D.   On: 09/15/2014 10:18    ASSESSMENT / PLAN:  19 year old male with severe morbid obesity and chronic hypercarbic respiratory failure on BiPAP, had a recent change in BiPAP mask which has been ineffective for him.  Patient has no  signs of infection at this time and I would not treat this as an infection.  The BiPAP can easily be adjusted to an auto-PAP if a machine is available to increase to 20 cmH2O but that can be addressed as outpatient.  The mother asked details about tracheostomy and was informed that eventually he will likely require it given rate of weight gain.  Also history of asthma and nasal congestion.  Plan: - Continue flovent HFA. - PRN albuterol. - Duoneb (doubt how effective that is). - Continue flonase. - Continue claritin and singulair. - Will need OP PFT to document presence of actual obstructive lung disease (I feel this is mostly restrictive due to body habitus). - Mask prescription given to patient's mother by outpatient MD so will be able to get the right mask. - We discussed the tracheostomy option but given that the patient's symptoms were illicited by change is hardware I would address the hardware concerns first prior to considerations of any surgical procedures. - Arrange for F/U as outpatient. - Given mental status back to baseline, no indication for ABG right now. - If mental status worsens then can reconsider ABG, most beneficial would be an AM ABG right before taking BiPAP off is symptomatic. - PCCM will see again on Monday.  Rush Farmer, M.D. [facility] Pulmonary/Critical Care Medicine. Pager: [phone]. After hours pager: [phone].  09/16/2014, 1:42 PM

## 2014-09-16 NOTE — Progress Notes (Signed)
Nutrition Brief Note  RD consulted to assess nutrition needs. Pt with hx of Prader Willi syndrome and follows a 1200 kcal diet at home.  Wt Readings from Last 15 Encounters:  09/15/14 400 lb (181.439 kg)  09/15/14 391 lb 4.8 oz (177.493 kg)  02/07/14 371 lb (168.284 kg)  10/21/13 369 lb (167.377 kg) (100 %*, Z = 3.70)  08/30/13 367 lb 4.8 oz (166.606 kg) (100 %*, Z = 3.68)  08/04/13 367 lb (166.47 kg) (100 %*, Z = 3.67)  07/12/13 370 lb (167.831 kg) (100 %*, Z = 3.69)  06/30/13 373 lb (169.192 kg) (100 %*, Z = 3.70)  02/12/13 353 lb (160.12 kg) (100 %*, Z = 3.52)  01/19/13 356 lb (161.481 kg) (100 %*, Z = 3.54)  01/07/13 358 lb 9.6 oz (162.66 kg) (100 %*, Z = 3.55)  12/16/12 355 lb 14.4 oz (161.435 kg) (100 %*, Z = 3.53)  04/20/12 330 lb (149.687 kg) (100 %*, Z = 3.31)  10/30/11 318 lb (144.244 kg) (100 %*, Z = 3.25)  10/02/11 323 lb (146.512 kg) (100 %*, Z = 3.30)   * Growth percentiles are based on CDC 2-20 Years data.   19 yo male with known Prader Willi syndrome, obesity hypoventilation syndrome, obstructive sleep apnea and an impressive BMI of 79 presents with headache, hypercarbia and hypoxia. No obvious infection. Decline seemed to start one week ago when he got a new home bipap mask.  Spoke with RN who confirmed Heart Healthy/ Carb Modified diet order. She reports mom follows this at at home.   Spoke with mother and pt at bedside. Mother reports pt is eating well and has no nutritional concerns at this time. Confirmed that pt was on a heart healthy/carb modified diet. Mother reveals that this is what pt follows at home. Reviewed menu and choices with pt and mother. Both were grateful for visit.   Body mass index is 80.75 kg/(m^2). Patient meets criteria for extreme obesity, class III based on current BMI.   Current diet order is carb modified/heart healthy, patient is consuming approximately 100% of meals at this time. Labs and medications reviewed.   No nutrition  interventions warranted at this time. If nutrition issues arise, please consult RD.   Shaynna Husby A. Mayford KnifeWilliams, RD, LDN, CDE Pager: [phone] After hours Pager: [phone]

## 2014-09-16 NOTE — Discharge Summary (Signed)
Family Medicine Teaching Service Hospital Discharge Summary  Patient name: [name] Medical record number: [id number] Date of birth: 04/17/1996 Age: 18 y.o. Gender: male Date of Admission: 09/15/2014  Date of Discharge: 09/18/2014 Admitting Physician: William Arthur Hensel, MD  Primary Care Provider: Sonnenberg, Eric, MD Consultants: Pulmonology   Indication for Hospitalization: Respiratory Failure   Discharge Diagnoses/Problem List:  Prader-Willi Syndrome  Obesity Hypoventilation Syndrome Chronic respiratory failure Raynaud's phenomenon Hypertension Asthma Enuresis Congenital absence of Kidney  Disposition: Discharge Home with home health.   Discharge Condition: Stable   Discharge Exam:  Physical Exam: General: 18yo obese male resting comfortably, NAD Cardiovascular: RRR, No MGR, Normal S1/S2. Distant heart sounds 2/2 habitus  Respiratory: CTA B, No crackles, rales, or wheezes audible. [place] in place and set on 1L Abdomen: Large, S, NT, ND, +BS Extremities: Obese, no edema appreciated however difficult to evaluate fluid status Skin: No lesions, no rashes Neuro: Grossly intact.   Brief Hospital Course:  18 y.o. male presenting with shortness of breath and hypoxia to 55% from clinic. PMH is significant for Prader-Willi syndrome, obesity hypoventilation syndrome, sleep apnea, Raynaud's phenomenon, hypertension, asthma, enuresis, congenital absence kidney.  Hypoxia  - Pt. Was admitted and started on his home regimens of Qvar, Singulari, Zyrtec, Flonase. CXR with concern for bilateral basilar opacities, though poor film 2/2 habitus. His hypoxia resolved with 2L O2 supplementation. He was able to maintain O2 sats with minimal intervention. He improved during this hospitalization with O2 supplementation only. He was found to drop to 89% intermittently when attempted to wean O2 to below 1L. Pulmonology was consulted and they as well as our team, felt that he is most likely at a new  baseline and will require some level of O2 supplementation. Echocardiogram was performed which demonstrated G1DD, and Mild Pulm HTN with 34mmHg PASP. Otherwise, he was found to be stable and safe for discharge to home with home O2.   Of note, we did have a conversation with the mom and son about the eventual need for a tracheostomy in him if he continues to gain weight as his BMI curve has shown. They understand, and agree with our goals for attempting to do the best medical management that we can for him now.   All of his other medical problems were adressed using his home medication regimens. Nothing was changed from these regimens.   Issues for Follow Up:  1. A1C- 6.9 Consider starting metformin or another form of treatment for hyperglycemia.  2. Follow up compliance with home O2 3. Follow up diet 4. Follow up pulmonology appointments and ongoing follow up.   Significant Procedures: None  Significant Labs and Imaging:   Recent Labs Lab 09/15/14 1020 09/15/14 1048 09/16/14 0551  WBC 11.4*  --  10.4  HGB 12.1* 15.0 11.6*  HCT 40.0 44.0 37.1*  PLT 246  --  230    Recent Labs Lab 09/15/14 1020 09/15/14 1048 09/16/14 0551  NA 139 140 138  K 5.1 5.0 5.0  CL 101 100 101  CO2 28  --  27  GLUCOSE 138* 131* 94  BUN 29* 38* 24*  CREATININE 1.02 1.00 0.91  CALCIUM 8.6  --  8.7  ALKPHOS 129*  --   --   AST 28  --   --   ALT 19  --   --   ALBUMIN 3.1*  --   --       Results/Tests Pending at Time of Discharge: None  Discharge Medications:      Medication List    ASK your doctor about these medications        AEROCHAMBER MV inhaler  Use with albuterol inhaler     albuterol (2.5 MG/3ML) 0.083% nebulizer solution  Commonly known as:  PROVENTIL  INHALE 1 VIAL IN NEBULIZER ONCE DAILY     PROAIR HFA 108 (90 BASE) MCG/ACT inhaler  Generic drug:  albuterol  inhale 2 puffs every 6 hours if needed     cetirizine 10 MG tablet  Commonly known as:  ZYRTEC  take 1 tablet by  mouth once daily     clotrimazole 1 % cream  Commonly known as:  LOTRIMIN  apply to affected area twice a day     desmopressin 0.2 MG tablet  Commonly known as:  DDAVP  Take 0.4 mg by mouth at bedtime.     fluticasone 50 MCG/ACT nasal spray  Commonly known as:  FLONASE  instill 2 sprays into each nostril once daily     lisinopril-hydrochlorothiazide 20-12.5 MG per tablet  Commonly known as:  PRINZIDE,ZESTORETIC  take 1 tablet by mouth once daily     Misc. Devices Misc  Please provide a pulse oximeter for the ear lobe. Patient has diagnosis of Raynaud, asthma     montelukast 10 MG tablet  Commonly known as:  SINGULAIR  take 1 tablet by mouth at bedtime     mupirocin ointment 2 %  Commonly known as:  BACTROBAN  APPLY TO AFECTED AREA 3 TIMES A DAY FOR 7 DAYS     nystatin 100000 UNIT/GM Powd  APPLY TO AREA WITHIN SKIN FOLDS TWO TIMES A DAY WHEN THERE IS REDNESS OR IRRITATION PRESENT     nystatin-triamcinolone cream  Commonly known as:  MYCOLOG II  Apply 1 application topically 2 (two) times daily.     oxybutynin 5 MG tablet  Commonly known as:  DITROPAN  Take 1 tablet (5 mg total) by mouth at bedtime.     QVAR 80 MCG/ACT inhaler  Generic drug:  beclomethasone  inhale 4 puffs twice a day     Respiratory Therapy Supplies Misc  BiPap, titration setting 20/15     Nebulizer/Adult Mask Kit  1 Device by Does not apply route once.        Discharge Instructions: Please refer to Patient Instructions section of EMR for full details.  Patient was counseled important signs and symptoms that should prompt return to medical care, changes in medications, dietary instructions, activity restrictions, and follow up appointments.   Follow-Up Appointments: Follow-up Information    Follow up with Sonnenberg, Eric, MD. Go on 09/27/2014.   Specialty:  Family Medicine   Why:  Hospital Follow Up - 10:30 AM.    Contact information:   [address] [place]  [address] [phone]       Caleb G Melancon, MD 09/18/2014, 11:57 AM PGY-1, [facility]  

## 2014-09-16 NOTE — Progress Notes (Signed)
PT Cancellation Note  Patient Details Name: Evaristo Buryazhae M Trinka MRN: [id number] DOB: 1996-07-07   Cancelled Treatment:    Reason Eval/Treat Not Completed: Patient declined, no reason specified;Other (comment) (Mother states he doesn't want to get up since he was napping) before PT got there.  Pt was awake when PT entered.   Ivar DrapeStout, Tiegan Jambor E 09/16/2014, 5:07 PM   Samul Dadauth Caro Brundidge, PT MS Acute Rehab Dept. Number: [phone]

## 2014-09-17 ENCOUNTER — Inpatient Hospital Stay (HOSPITAL_COMMUNITY): Payer: Medicaid Other

## 2014-09-17 DIAGNOSIS — R0600 Dyspnea, unspecified: Secondary | ICD-10-CM

## 2014-09-17 LAB — GLUCOSE, CAPILLARY: GLUCOSE-CAPILLARY: 179 mg/dL — AB (ref 70–99)

## 2014-09-17 MED ORDER — HYDROCHLOROTHIAZIDE 12.5 MG PO CAPS
12.5000 mg | ORAL_CAPSULE | Freq: Every day | ORAL | Status: DC
  Administered 2014-09-17 – 2014-09-18 (×2): 12.5 mg via ORAL
  Filled 2014-09-17 (×2): qty 1

## 2014-09-17 MED ORDER — LISINOPRIL 20 MG PO TABS
20.0000 mg | ORAL_TABLET | Freq: Every day | ORAL | Status: DC
  Administered 2014-09-17 – 2014-09-18 (×2): 20 mg via ORAL
  Filled 2014-09-17 (×2): qty 1

## 2014-09-17 MED ORDER — SENNA 8.6 MG PO TABS
1.0000 | ORAL_TABLET | Freq: Every day | ORAL | Status: DC
  Administered 2014-09-17 – 2014-09-18 (×2): 8.6 mg via ORAL
  Filled 2014-09-17 (×3): qty 1

## 2014-09-17 MED ORDER — METFORMIN HCL 500 MG PO TABS
500.0000 mg | ORAL_TABLET | Freq: Every day | ORAL | Status: DC
  Filled 2014-09-17 (×2): qty 1

## 2014-09-17 MED ORDER — LISINOPRIL-HYDROCHLOROTHIAZIDE 20-12.5 MG PO TABS: 1.0000 | ORAL_TABLET | Freq: Every day | ORAL | Status: DC

## 2014-09-17 NOTE — Progress Notes (Signed)
Family Medicine Teaching Service Daily Progress Note Intern Pager: [phone]  Patient name: [name] Medical record number: [id number] Date of birth: 02-Jun-1996 Age: 19 y.o. Gender: male  Primary Care Provider: Marikay Alar, MD Consultants: None Code Status: Full  Pt Overview and Major Events to Date:  2/11 - Pt. Admitted with hypoxia 2/2 acute on chronic respiratory failure.  2/12 - Continue to monitor O2 requirement and improvement in respiratory function.   Assessment and Plan: 19 y.o. male presenting with shortness of breath and hypoxia to 55% from clinic. PMH is significant for Prader-Willi syndrome, obesity hypoventilation syndrome, sleep apnea, Raynaud's phenomenon, hypertension, asthma, enuresis, congenital absence kidney.  # Hypoxia - found to be hypoxic to 55% in the clinic in the setting of recent inability to adequately use his BiPAP due to mask discomfort and an adequate seal. Not tolerating BIpap at home - Currently on 2L [place] and satting 92% - CMP unremarkable. - ABG with hypercarbia to 61, compensated with a pH of 7.314 and bicarbonate of 31.1 - BNP of 38.3 - CXR poor quality due to hypoventilation. Diffuse bilateral airspace opacities consistent with pneumonia or edema - Follow up Echo today. - Pulmonology consulted. Appreciate recommendations. - Continue duonebs as patient feels that they help.  - Continue prn albuterol - Continue Zyrtec - Continue Qvar 80 g 4 puffs twice a day - Continue Singulair 10 mg daily - Flonase daily - Consult care management to address home CPAP situation - O2 as needed to keep oxygen saturation greater than 90% - Wean oxygen as tolerated, and as patient improves. - Incentive spirometry - BiPAP at night  # Headache-  Most likely related to hypercarbia and hypoxic state. Headache resolves with Tylenol.  - Tylenol prn.  - Suspect headache will improve with improvement in hypoxia and hypercarbia.   # Hypertension -  normotensive here. Has been taking Prinzide at home with good control. Continue medications here. - Lisinopril 20 mg daily - HCTZ 12.5 mg daily  # Enuresis - patient with history of enuresis. Will continue home medications here.  - Continue Ditropan 5 mg twice daily  - Continue desmopressin 0.4 mg at bedtime  FEN/GI: Low-calorie diet will consult nutrition. Patient on 1200-calorie restriction. Prophylaxis: Heparin subcutaneous  Disposition: Pending improvement in respiratory status and care management with Bipap supplies.   Subjective:  No acute events overnight. Breathing improved. Complains of constipation. Still feels like his breathing mask is leaking. Mother states she should be getting his new mask from [facility] and she would like to bring it in and see how he is does with it. No further concerns.   Objective: Temp:  [97.9 F (36.6 C)-98.5 F (36.9 C)] 97.9 F (36.6 C) (02/12 2119) Pulse Rate:  [101-110] 110 (02/12 2119) Resp:  [18-24] 18 (02/12 2119) BP: (121-147)/(56-65) 121/56 mmHg (02/12 2119) SpO2:  [92 %-96 %] 92 % (02/12 2119) Physical Exam: General: 18yo obese male resting comfortably in no apparent distress Cardiovascular: RRR, No MGR, Normal S1/S2. Distant heart sounds 2/2 habitus  Respiratory: CTA B, No crackles, rales, or wheezes audible. [place] in place and set on 2L Abdomen: Large, S, NT, ND, +BS Extremities: Obese, no edema appreciated however difficult to evaluate fluid status Skin: No lesions, no rashes Neuro: Grossly intact.   Laboratory:  Recent Labs Lab 09/15/14 1020 09/15/14 1048 09/16/14 0551  WBC 11.4*  --  10.4  HGB 12.1* 15.0 11.6*  HCT 40.0 44.0 37.1*  PLT 246  --  230  Recent Labs Lab 09/15/14 1020 09/15/14 1048 09/16/14 0551  NA 139 140 138  K 5.1 5.0 5.0  CL 101 100 101  CO2 28  --  27  BUN 29* 38* 24*  CREATININE 1.02 1.00 0.91  CALCIUM 8.6  --  8.7  PROT 7.8  --   --   BILITOT 0.7  --   --   ALKPHOS 129*  --    --   ALT 19  --   --   AST 28  --   --   GLUCOSE 138* 131* 94  BNP - 38.3 ABG - CO2 61.3, O2 - 58, Bicarb - 31.1, Ph - 7.314 Bld. Cx - pending.   Imaging/Diagnostic Tests: CXR 2/11:  IMPRESSION: 1. Cardiopericardial enlargement, pericardial effusion not excluded. 2. Severely limited evaluation of the lungs to patient factors. There is hypoventilation with grossly symmetric aeration.  [address] Moscow MillsRumley, [place]DO 09/17/2014, 9:04 AM PGY-1 [facility] FPTS Intern pager: [phone], text pages welcome

## 2014-09-17 NOTE — Progress Notes (Addendum)
BIPAP set up at patient bedside.  Patient's mother places patient on for the night.  She is aware to call RT if she needs further assistance.

## 2014-09-17 NOTE — Evaluation (Signed)
Physical Therapy Evaluation Patient Details Name: [name] MRN: [id number] DOB: April 12, 1996 Today's Date: 09/17/2014   History of Present Illness  [name] is a 19 y.o. male presenting with shortness of breath and hypoxia to 55% from clinic. PMH is significant for Prader-Willi syndrome, obesity hypoventilation syndrome, sleep apnea, Raynaud's phenomenon, hypertension, asthma, enuresis, congenital absence kidney.  Clinical Impression  Pt admitted with/for hypoxia.  Pt currently limited functionally due to the problems listed below.  (see problems list.)  Pt will benefit from PT to maximize function and safety to be able to get home safely with available assist of family.     Follow Up Recommendations No PT follow up    Equipment Recommendations  None recommended by PT    Recommendations for Other Services       Precautions / Restrictions Precautions Precautions: Fall      Mobility  Bed Mobility Overal bed mobility: Needs Assistance Bed Mobility: Supine to Sit     Supine to sit: Min assist     General bed mobility comments: help to initiate up off the bed  Transfers Overall transfer level: Needs assistance   Transfers: Sit to/from Stand Sit to Stand: Supervision         General transfer comment: generally safe  Ambulation/Gait Ambulation/Gait assistance: Supervision Ambulation Distance (Feet): 300 Feet (total with 3 standing rests to check sats and recover.) Assistive device: None Gait Pattern/deviations: Step-through pattern Gait velocity: moderate   General Gait Details: generally steady, but with necessarily wide BOS and large lateral w/shifts.  Quickly fatigues and sats dip into the upper 70's and low 80's with EHR inthe 119-to upper 120's bpm.  Generally takes efficient breathing coaching and about 1-2 minutes to return to 90% on 4L [place]  Stairs            Wheelchair Mobility    Modified Rankin (Stroke Patients Only)       Balance  Overall balance assessment: Needs assistance Sitting-balance support: No upper extremity supported Sitting balance-Leahy Scale: Good                                       Pertinent Vitals/Pain Pain Assessment: No/denies pain    Home Living Family/patient expects to be discharged to:: Private residence Living Arrangements: Parent Available Help at Discharge: Family Type of Home: House Home Access: Stairs to enter     Home Layout: One level Home Equipment: None Additional Comments: Normally not on supplemental oxygen days    Prior Function Level of Independence: Independent         Comments: does alot for himself in home environment     Hand Dominance        Extremity/Trunk Assessment   Upper Extremity Assessment:  (functional use of UE's, limited by body habitys)           Lower Extremity Assessment: Overall WFL for tasks assessed (limited by body habitus)         Communication   Communication: No difficulties  Cognition Arousal/Alertness: Awake/alert Behavior During Therapy: WFL for tasks assessed/performed Overall Cognitive Status: History of cognitive impairments - at baseline                      General Comments      Exercises        Assessment/Plan    PT Assessment Patient needs continued PT services  PT Diagnosis Abnormality of gait;Other (comment) (decreased activity tolerance)   PT Problem List Decreased strength;Decreased activity tolerance;Decreased mobility;Cardiopulmonary status limiting activity  PT Treatment Interventions Gait training;Functional mobility training;Therapeutic activities;Patient/family education;Stair training   PT Goals (Current goals can be found in the Care Plan section) Acute Rehab PT Goals Patient Stated Goal: none stated, Mom hopes the breathing get better soon PT Goal Formulation: With patient Time For Goal Achievement: 10/01/14 Potential to Achieve Goals: Good    Frequency Min  3X/week   Barriers to discharge        Co-evaluation               End of Session Equipment Utilized During Treatment: Oxygen Activity Tolerance: Patient limited by fatigue;Patient tolerated treatment well Patient left: in chair;with family/visitor present Nurse Communication: Mobility status         Time: 1750-1815 PT Time Calculation (min) (ACUTE ONLY): 25 min   Charges:   PT Evaluation $Initial PT Evaluation Tier I: 1 Procedure PT Treatments $Gait Training: 8-22 mins   PT G Codes:        Vickie Melnik, Eliseo GumKenneth V 09/17/2014, 6:28 PM 09/17/2014  Lantana BingKen Mindi Akerson, PT [phone]  (pager)

## 2014-09-17 NOTE — Progress Notes (Signed)
  Echocardiogram 2D Echocardiogram has been performed.  Aris EvertsRix, Moody Robben A 09/17/2014, 3:31 PM

## 2014-09-18 DIAGNOSIS — E662 Morbid (severe) obesity with alveolar hypoventilation: Secondary | ICD-10-CM

## 2014-09-18 DIAGNOSIS — L304 Erythema intertrigo: Secondary | ICD-10-CM

## 2014-09-18 DIAGNOSIS — I10 Essential (primary) hypertension: Secondary | ICD-10-CM

## 2014-09-18 DIAGNOSIS — R0602 Shortness of breath: Secondary | ICD-10-CM | POA: Insufficient documentation

## 2014-09-18 DIAGNOSIS — J9601 Acute respiratory failure with hypoxia: Secondary | ICD-10-CM

## 2014-09-18 DIAGNOSIS — R635 Abnormal weight gain: Secondary | ICD-10-CM

## 2014-09-18 DIAGNOSIS — N3944 Nocturnal enuresis: Secondary | ICD-10-CM

## 2014-09-18 DIAGNOSIS — I7300 Raynaud's syndrome without gangrene: Secondary | ICD-10-CM

## 2014-09-18 DIAGNOSIS — R802 Orthostatic proteinuria, unspecified: Secondary | ICD-10-CM

## 2014-09-18 LAB — GLUCOSE, CAPILLARY: Glucose-Capillary: 88 mg/dL (ref 70–99)

## 2014-09-18 NOTE — Care Management Note (Signed)
    Page 1 of 1   09/18/2014     12:38:56 PM CARE MANAGEMENT NOTE 09/18/2014  Patient:  Joshua BuryROACH,Joshua M   Account Number:  [id number]  Date Initiated:  09/18/2014  Documentation initiated by:  [facility],Mickell Birdwell  Subjective/Objective Assessment:   adm: Shortness of Breath     Action/Plan:   discharge planning   Anticipated DC Date:  09/18/2014   Anticipated DC Plan:  HOME/SELF CARE      DC Planning Services  CM consult      PAC Choice  NA   Choice offered to / List presented to:     DME arranged  [facility]      DME agency  [facility]        Status of service:  Completed, signed off Medicare Important Message given?   (If response is "NO", the following Medicare IM given date fields will be blank) Date Medicare IM given:   Medicare IM given by:   Date Additional Medicare IM given:   Additional Medicare IM given by:    Discharge Disposition:  HOME/SELF CARE  Per UR Regulation:    If discussed at Long Length of Stay Meetings, dates discussed:    Comments:  09/18/14 12:20 CM received call from MD who states pt has home O2 but needs a loaner tank to get home. CM called [facility] DME rep, Fayrene FearingJames to please deliver a tank of O2 prior to discharge.  CM called RN, Edgardo RoysGreta to please ensure the pt goes home with the loaner tank of oxygen.  No other CM needs were communicated.  Freddy JakschSarah Daniyla Pfahler, BSN, CM [phone].

## 2014-09-18 NOTE — Discharge Instructions (Signed)
Thanks for letting us take care of you.   We are glad that [name] is doing much better.   He will need O2 at home to help with his pulmonary hypertension.   Continue to use his BiPAP as you were before.   Follow up with your PCP Dr. Birdie Sons and Pulmonology as previously scheduled.   Sincerely,  Devota Pace, MD Family Medicine - PGY 1    Acute Respiratory Failure Respiratory failure is when your lungs are not working well and your breathing (respiratory) system fails. When respiratory failure occurs, it is difficult for your lungs to get enough oxygen, get rid of carbon dioxide, or both. Respiratory failure can be life threatening.  Respiratory failure can be acute or chronic. Acute respiratory failure is sudden, severe, and requires emergency medical treatment. Chronic respiratory failure is less severe, happens over time, and requires ongoing treatment.  WHAT ARE THE CAUSES OF ACUTE RESPIRATORY FAILURE?  Any problem affecting the heart or lungs can cause acute respiratory failure. Some of these causes include the following:  Chronic bronchitis and emphysema (COPD).   Blood clot going to a lung (pulmonary embolism).   Having water in the lungs caused by heart failure, lung injury, or infection (pulmonary edema).   Collapsed lung (pneumothorax).   Pneumonia.   Pulmonary fibrosis.   Obesity.   Asthma.   Heart failure.   Any type of trauma to the chest that can make breathing difficult.   Nerve or muscle diseases making chest movements difficult. WHAT SYMPTOMS SHOULD YOU WATCH FOR?  If you have any of these signs or symptoms, you should seek immediate medical care:   You have shortness of breath (dyspnea) with or without activity.   You have rapid, fast breathing (tachypnea).   You are wheezing.  You are unable to say more than a few words without having to catch your breath.  You find it very difficult to function normally.  You have a fast  heart rate.   You have a bluish color to your finger or toe nail beds.   You have confusion or drowsiness or both.  HOW WILL MY ACUTE RESPIRATORY FAILURE BE TREATED?  Treatment of acute respiratory failure depends on the cause of the respiratory failure. Usually, you will stay in the intensive care unit so your breathing can be watched closely. Treatment can include the following:  Oxygen. Oxygen can be delivered through the following:  Nasal cannula. This is small tubing that goes in your nose to give you oxygen.  Face mask. A face mask covers your nose and mouth to give you oxygen.  Medicine. Different medicines can be given to help with breathing. These can include:  Nebulizers. Nebulizers deliver medicines to open the air passages (bronchodilators). These medicines help to open or relax the airways in the lungs so you can breathe better. They can also help loosen mucus from your lungs.  Diuretics. Diuretic medicines can help you breathe better by getting rid of extra water in your body.  Steroids. Steroid medicines can help decrease swelling (inflammation) in your lungs.  Antibiotics.  Chest tube. If you have a collapsed lung (pneumothorax), a chest tube is placed to help reinflate the lung.  Non-invasive positive pressure ventilation (NPPV). This is a tight-fitting mask that goes over your nose and mouth. The mask has tubing that is attached to a machine. The machine blows air into the tubing, which helps to keep the tiny air sacs (alveoli) in your lungs open. This machine  allows you to breathe on your own.  Ventilator. A ventilator is a breathing machine. When on a ventilator, a breathing tube is put into the lungs. A ventilator is used when you can no longer breathe well enough on your own. You may have low oxygen levels or high carbon dioxide (CO2) levels in your blood. When you are on a ventilator, sedation and pain medicines are given to make you sleep so your lungs can  heal. Document Released: 07/27/2013 Document Revised: 12/06/2013 Document Reviewed: 07/27/2013 [facility] Patient Information 2015 [employer]. This information is not intended to replace advice given to you by your health care provider. Make sure you discuss any questions you have with your health care provider.

## 2014-09-18 NOTE — Progress Notes (Signed)
Patient was discharged home by MD order; discharged instructions  review and give to patient and his mother with care notes; IV DIC; skin intact; patient will be escorted to the car by nurse tech via wheelchair.

## 2014-09-18 NOTE — Progress Notes (Signed)
Patient has home BiPAP that his mother places on him.

## 2014-09-21 ENCOUNTER — Encounter: Payer: Self-pay | Admitting: Family Medicine

## 2014-09-21 LAB — CULTURE, BLOOD (ROUTINE X 2)
Culture: NO GROWTH
Culture: NO GROWTH

## 2014-09-21 NOTE — Progress Notes (Signed)
Mother dropped off form to be filled out for [employer].  Please call her to pick up when completed.

## 2014-09-22 NOTE — ED Provider Notes (Signed)
CSN: [id number]     Arrival date & time 09/15/14  5726 History   First MD Initiated Contact with Patient 09/15/14 [phone]     Chief Complaint  Patient presents with  . Shortness of Breath     (Consider location/radiation/quality/duration/timing/severity/associated sxs/prior Treatment) HPI The patient was seen in his outpatient provider office today for shortness of breath. The patient has Prader-Willi syndrome and does not communicate pain or illness well. His mother has observed him to appear more short of breath based on resting more frequently during walking and looking like he is breathing more quickly. She denies she's had fever or sputum with cough. She believes he's had a headache based on him holding his forehead sometimes. At the outpatient providers office the patient was hypoxic and was given a albuterol treatment prior to arrival. The patient cannot communicate any complaints to me. Past Medical History  Diagnosis Date  . Asthma   . Prader-Willi syndrome   . Obesity   . Obesity hypoventilation syndrome   . Sleep apnea   . Raynaud's phenomenon   . Kidney dysfunction     left   Past Surgical History  Procedure Laterality Date  . Nissen fundoplication    . Tonsillectomy     History reviewed. No pertinent family history. History  Substance Use Topics  . Smoking status: Never Smoker   . Smokeless tobacco: Never Used  . Alcohol Use: No    Review of Systems  Complete review of systems as done by the mother's report. There is not been recent vomiting or feeding difficulty. No diarrhea. The patient does not have an indwelling Foley catheter.  Allergies  Review of patient's allergies indicates no known allergies.  Home Medications   Prior to Admission medications   Medication Sig Start Date End Date Taking? Authorizing Provider  albuterol (PROVENTIL) (2.5 MG/3ML) 0.083% nebulizer solution INHALE 1 VIAL IN NEBULIZER ONCE DAILY Patient taking differently: Take 2.5 mg by  nebulization 2 (two) times daily as needed for wheezing or shortness of breath. INHALE 1 VIAL IN NEBULIZER ONCE DAILY 02/07/14  Yes Leone Haven, MD  cetirizine (ZYRTEC) 10 MG tablet take 1 tablet by mouth once daily 08/15/14  Yes Leone Haven, MD  desmopressin (DDAVP) 0.2 MG tablet Take 0.4 mg by mouth at bedtime.    Yes Historical Provider, MD  fluticasone (FLONASE) 50 MCG/ACT nasal spray instill 2 sprays into each nostril once daily 09/14/14  Yes Leone Haven, MD  lisinopril-hydrochlorothiazide [facility]) 20-12.5 MG per tablet take 1 tablet by mouth once daily 08/15/14  Yes Leone Haven, MD  montelukast (SINGULAIR) 10 MG tablet take 1 tablet by mouth at bedtime 04/15/14  Yes Leone Haven, MD  mupirocin ointment (BACTROBAN) 2 % APPLY TO AFECTED AREA 3 TIMES A DAY FOR 7 DAYS 12/15/13  Yes Leone Haven, MD  nystatin (MYCOSTATIN/NYSTOP) 100000 UNIT/GM POWD APPLY TO AREA WITHIN SKIN FOLDS TWO TIMES A DAY WHEN THERE IS REDNESS OR IRRITATION PRESENT 08/04/13  Yes Leone Haven, MD  nystatin-triamcinolone (MYCOLOG II) cream Apply 1 application topically 2 (two) times daily. 06/30/13  Yes Leone Haven, MD  oxybutynin (DITROPAN) 5 MG tablet Take 1 tablet (5 mg total) by mouth at bedtime. Patient taking differently: Take 10 mg by mouth at bedtime.  04/20/12  Yes Carolin Guernsey, MD  QVAR 80 MCG/ACT inhaler inhale 4 puffs twice a day 08/15/14  Yes Leone Haven, MD  clotrimazole (LOTRIMIN) 1 % cream apply to  affected area twice a day Patient not taking: Reported on 09/15/2014 07/05/14   Leone Haven, MD  Misc. Devices MISC Please provide a pulse oximeter for the ear lobe. Patient has diagnosis of Raynaud, asthma 09/16/11   Judithann Sheen, MD  PROAIR HFA 108 [phone] BASE) MCG/ACT inhaler inhale 2 puffs every 6 hours if needed 02/16/14   Collene Gobble, MD  Respiratory Therapy Supplies (NEBULIZER/ADULT MASK) KIT 1 Device by Does not apply route once. 05/20/14   Leone Haven, MD  Respiratory Therapy Supplies MISC BiPap, titration setting 20/15 08/05/12   Carolin Guernsey, MD  Spacer/Aero-Holding Chambers (AEROCHAMBER MV) inhaler Use with albuterol inhaler 04/20/12   Carolin Guernsey, MD   BP 116/80 mmHg  Pulse 87  Temp(Src) 97.4 F (36.3 C) (Oral)  Resp 18  Ht 4' 11" (1.499 m)  Wt 400 lb (181.439 kg)  BMI 80.75 kg/m2  SpO2 89% Physical Exam  Constitutional:  The patient is morbidly obese and alert. Although on the monitor there is hypoxia the patient is alert and in appearance and exhibits minimal respiratory distress.  HENT:  Head: Normocephalic and atraumatic.  Mouth/Throat: Oropharynx is clear and moist.  Eyes: EOM are normal. Pupils are equal, round, and reactive to light.  Neck: Neck supple.  Cardiovascular: Normal rate, regular rhythm, normal heart sounds and intact distal pulses.   Pulmonary/Chest:  Mild increased work of breathing. The patient has a very thick chest wall and breath sounds are soft but I do not appreciate active wheeze rhonchi.  Abdominal: Soft.  Morbid obesity no frank tenderness.  Musculoskeletal: He exhibits no edema or tenderness.  Neurological: He is alert.  The patient does not have verbal interaction with me. There is no obvious focal neurologic dysfunction and his cranial nerves are symmetric.  Skin: Skin is warm and dry.  Psychiatric:  The patient is calm and cooperative.    ED Course  Procedures (including critical care time) Labs Review Labs Reviewed  COMPREHENSIVE METABOLIC PANEL - Abnormal; Notable for the following:    Glucose, Bld 138 (*)    BUN 29 (*)    Albumin 3.1 (*)    Alkaline Phosphatase 129 (*)    All other components within normal limits  CBC WITH DIFFERENTIAL/PLATELET - Abnormal; Notable for the following:    WBC 11.4 (*)    Hemoglobin 12.1 (*)    MCH 23.6 (*)    RDW 16.4 (*)    Neutro Abs 8.6 (*)    All other components within normal limits  URINALYSIS, ROUTINE W REFLEX  MICROSCOPIC - Abnormal; Notable for the following:    Protein, ur 100 (*)    All other components within normal limits  HEMOGLOBIN A1C - Abnormal; Notable for the following:    Hgb A1c MFr Bld 6.9 (*)    All other components within normal limits  BASIC METABOLIC PANEL - Abnormal; Notable for the following:    BUN 24 (*)    All other components within normal limits  CBC - Abnormal; Notable for the following:    Hemoglobin 11.6 (*)    HCT 37.1 (*)    MCV 76.0 (*)    MCH 23.8 (*)    RDW 16.2 (*)    All other components within normal limits  GLUCOSE, CAPILLARY - Abnormal; Notable for the following:    Glucose-Capillary 148 (*)    All other components within normal limits  GLUCOSE, CAPILLARY - Abnormal; Notable for the following:  Glucose-Capillary 179 (*)    All other components within normal limits  I-STAT ARTERIAL BLOOD GAS, ED - Abnormal; Notable for the following:    pH, Arterial 7.314 (*)    pCO2 arterial 61.3 (*)    pO2, Arterial 58.0 (*)    Bicarbonate 31.1 (*)    Acid-Base Excess 3.0 (*)    All other components within normal limits  I-STAT CHEM 8, ED - Abnormal; Notable for the following:    BUN 38 (*)    Glucose, Bld 131 (*)    Calcium, Ion 1.08 (*)    All other components within normal limits  CULTURE, BLOOD (ROUTINE X 2)  CULTURE, BLOOD (ROUTINE X 2)  BRAIN NATRIURETIC PEPTIDE  URINE MICROSCOPIC-ADD ON  GLUCOSE, CAPILLARY  I-STAT TROPOININ, ED    Imaging Review No results found.   EKG Interpretation   Date/Time:  Thursday September 15 2014 09:38:24 EST Ventricular Rate:  113 PR Interval:  154 QRS Duration: 81 QT Interval:  334 QTC Calculation: 458 R Axis:   42 Text Interpretation:  Sinus tachycardia Low voltage, precordial leads ED  PHYSICIAN INTERPRETATION AVAILABLE IN [facility] Confirmed by TEST,  Record ([id number]) on 09/17/2014 2:43:10 PM      MDM   Final diagnoses:  Hypoventilation syndrome  Prader-Willi syndrome  Morbid obesity   Hypoxia    It appears hypoxia is most likely secondary to hypoventilation syndrome. The patient's mother reports that is BiPAP mask has not been fitting very well and he has not used it is well over these past 4 days approximately. There is no obvious pneumonia on chest x-ray although the quality of the film is somewhat limited due to body habitus. By history there is no fever or cough. At this time the patient will be admitted for further evaluation and treatment of hypoxia.    Charlesetta Shanks, MD 09/22/14 [phone]

## 2014-09-22 NOTE — Progress Notes (Signed)
Clinic info completed and placed in providers box. Jazmin Hartsell,CMA

## 2014-09-26 NOTE — Progress Notes (Signed)
Mom informed that form is complete and ready for pick up.  Rudra Hobbins L, RN  

## 2014-09-26 NOTE — Progress Notes (Signed)
Form completed and returned to Tamika Martin. 

## 2014-09-27 ENCOUNTER — Ambulatory Visit (INDEPENDENT_AMBULATORY_CARE_PROVIDER_SITE_OTHER): Payer: Medicaid Other | Admitting: Family Medicine

## 2014-09-27 ENCOUNTER — Encounter: Payer: Self-pay | Admitting: Family Medicine

## 2014-09-27 VITALS — BP 117/82 | HR 97 | Temp 98.2°F | Wt 370.2 lb

## 2014-09-27 DIAGNOSIS — E119 Type 2 diabetes mellitus without complications: Secondary | ICD-10-CM

## 2014-09-27 DIAGNOSIS — E662 Morbid (severe) obesity with alveolar hypoventilation: Secondary | ICD-10-CM

## 2014-09-27 DIAGNOSIS — R0689 Other abnormalities of breathing: Secondary | ICD-10-CM

## 2014-09-27 DIAGNOSIS — E118 Type 2 diabetes mellitus with unspecified complications: Secondary | ICD-10-CM | POA: Insufficient documentation

## 2014-09-27 DIAGNOSIS — E669 Obesity, unspecified: Secondary | ICD-10-CM

## 2014-09-27 DIAGNOSIS — E1169 Type 2 diabetes mellitus with other specified complication: Secondary | ICD-10-CM

## 2014-09-27 MED ORDER — METFORMIN HCL 500 MG PO TABS: 500.0000 mg | ORAL_TABLET | Freq: Every day | ORAL | Status: DC

## 2014-09-27 NOTE — Assessment & Plan Note (Deleted)
Doing well since discharge. Has been compliant with O2 and BiPAP. O2 in normal range and nml WOB. Mom seems to be more invested in weight loss to help prevent tracheostomy in the future and is working hard to help the patient lose weight. Reinforced the need for weight loss and for O2 to help slow progression of pulmonary HTN. Needs to f/u with pulm and appointment scheduled. Will have patient return to see me in one month to continue to work on weight loss to help with this issue.

## 2014-09-27 NOTE — Assessment & Plan Note (Signed)
Doing well since discharge. Has been compliant with O2 and BiPAP. O2 in normal range and nml WOB. Mom seems to be more invested in weight loss to help prevent tracheostomy in the future and is working hard to help the patient lose weight. Reinforced the need for weight loss and for O2 to help slow progression of pulmonary HTN. Needs to f/u with pulm and appointment scheduled. Will have patient return to see me in one month to continue to work on weight loss to help with this issue.  

## 2014-09-27 NOTE — Patient Instructions (Signed)
Nice to see you. You guys are doing a great job with [name]'s weight. Please continue to increase exercise as able and work on his diet. I have included the information for Dr Gerilyn PilgrimSykes our dietician if you would like to meet with her.  Please go to the pulmonology appointment as scheduled.  Please start the metformin for his diabetes.  If he develops trouble breathing, increasing oxygen requirement, chest pain, or decreased activity level please seek medical attention.

## 2014-09-27 NOTE — Progress Notes (Signed)
Patient ID: [name], male   DOB: Aug 25, 1995, 19 y.o.   MRN: [id number]  Marikay AlarEric Sonnenberg, MD Phone: [phone]  [name] is a 19 y.o. male who presents today for hospital follow-up.  Patient presents for hospital follow-up. Patient was admitted for respiratory failure and found to have new O2 requirement and pulmonary HTN. He was discharged on home oxygen and has been doing well with this. He has not had any dyspnea. He has been tolerating the O2 24/7. Typically on 1 L, though mom increases it to 1.5 L with any physical activity. It was recommended that he lose weight to help prevent progression to needing a tracheostomy in the future. Has been using BiPAP nightly. They have increased his physical activity by having him walk and using weights with his arms while he is sitting down. Mom has changed his diet as well and has cut out salt. She added all fresh veggies and smoothies. She notes he has lost 20 lbs since discharge. He has note followed up with pulmonology yet. He was noted to have an elevated A1c to 6.9 in the hospital. He denies polyuria and polydipsia.   Patient is a nonsmoker.   ROS: Per HPI   Physical Exam Filed Vitals:   09/27/14 1047  BP: 117/82  Pulse: 97  Temp: 98.2 F (36.8 C)    Gen: Well NAD, obese HEENT: PERRL,  MMM Lungs: Nl WOB, given body habitus breath sounds are distant Heart: RRR  Exts: Non edematous BL  LE, warm and well perfused.    Assessment/Plan: Please see individual problem list.  Marikay AlarEric Sonnenberg, MD [facility] PGY-3

## 2014-09-27 NOTE — Assessment & Plan Note (Signed)
A1c 6.9. New diagnosis. Discussed diet and exercise with mom and patient. Weight loss and dietary changes will be most beneficial. Offered Dr Gerilyn PilgrimSykes services and mom to think about this. Will also start on metformin 500 mg with breakfast each morning and titrate up in the future as able.

## 2014-09-27 NOTE — Assessment & Plan Note (Signed)
Patient with 20 lb weight loss since discharge. Commended patient and mother on diet and exercise changes they made and advised them to continue this. Given Dr Gerilyn PilgrimSykes card for nutrition counseling. Will see back in one month to continue to monitor this issue as further weight loss will be the most beneficial thing for his OHS.

## 2014-10-04 ENCOUNTER — Telehealth: Payer: Self-pay | Admitting: Family Medicine

## 2014-10-04 NOTE — Telephone Encounter (Signed)
Pt's mother called and stated that Metformin is hurting pt stomach and wants something else sent in and needs a glucometer as stated below. Please advise. Wonda Goodgame, CMA.

## 2014-10-04 NOTE — Telephone Encounter (Signed)
Metaformin is "tearing up his stomach"  Please call in something else Also needs a Accu-check AViva meter with lancets and strips

## 2014-10-04 NOTE — Telephone Encounter (Signed)
Will forward to MD. Jazmin Hartsell,CMA  

## 2014-10-06 MED ORDER — LANCETS 30G MISC: 1.0000 | Freq: Every day | Status: AC

## 2014-10-06 MED ORDER — ACCU-CHEK AVIVA DEVI: Status: AC

## 2014-10-06 MED ORDER — GLUCOSE BLOOD VI STRP: ORAL_STRIP | Status: DC

## 2014-10-06 NOTE — Telephone Encounter (Signed)
Please call the patients mother and see if she would be willing to try the extended release metformin as this has less GI upset or if she would like to try another medication all together. Sent in testing supplies. Thanks.

## 2014-10-07 MED ORDER — METFORMIN HCL ER 500 MG PO TB24: 500.0000 mg | ORAL_TABLET | Freq: Every day | ORAL | Status: DC

## 2014-10-07 NOTE — Telephone Encounter (Signed)
pts mom is returning call, did not relay information due to new policy.

## 2014-10-07 NOTE — Telephone Encounter (Signed)
Metformin XR sent to pharmacy.

## 2014-10-07 NOTE — Telephone Encounter (Signed)
Advised pt's mom, Octavio GravesBonita, as directed below and she stated that she would like to try extended release of Metformin.Jafar Poffenberger, CMA.

## 2014-10-07 NOTE — Telephone Encounter (Signed)
Left message for patient to return call, see message from Dr Birdie SonsSonnenberg.Busick, Rodena Medinobert Lee

## 2014-10-27 ENCOUNTER — Ambulatory Visit (INDEPENDENT_AMBULATORY_CARE_PROVIDER_SITE_OTHER): Payer: Medicaid Other | Admitting: Family Medicine

## 2014-10-27 ENCOUNTER — Encounter: Payer: Self-pay | Admitting: Family Medicine

## 2014-10-27 DIAGNOSIS — R251 Tremor, unspecified: Secondary | ICD-10-CM | POA: Insufficient documentation

## 2014-10-27 NOTE — Progress Notes (Signed)
Patient ID: Evaristo Buryazhae M Hoff, male   DOB: 11/18/1995, 19 y.o.   MRN: [id number]  Marikay AlarEric Brennan Karam, MD Phone: [phone]  [name] is a 19 y.o. male who presents today for f/u.  Obesity: care giver presents with patient today. Reports his mother has continued to make dietary changes. He does not like broccoli or green beans, though he has been eating them. He has been walking for exercise.   Hand tremor: mom and care giver noted this one week ago while the patient was eating. Never seen this previously. Notes it is has been present since that time. He has no change in gait. No weakness or numbness. Has been acting like himself. Only new medication is metformin. Care giver notes that he has not been using more albuterol around her, though is unsure of when mom is around the patient. He denies alcohol, tobacco, and illicit drug use.   PMH: nonsmoker.    ROS: Per HPI   Physical Exam Filed Vitals:   10/27/14 1913  BP: 102/68  Pulse:   Temp:     Gen: Well NAD HEENT: PERRL,  MMM Lungs: Nl WOB, distant breath sounds related to body habitus Heart: RRR  Neuro: CN 2-12 intact, 5/5 strength in bilateral biceps, triceps, grip, quads, hamstrings, plantar and dorsiflexion, sensation to light touch intact in bilateral UE and LE, normal gait, fine tremor noted at rest and on holding arms up, normal finger to nose, no intention tremor Exts: Non edematous BL  LE, warm and well perfused.    Assessment/Plan: Please see individual problem list.  Marikay AlarEric Zayn Selley, MD [facility] PGY-3

## 2014-10-27 NOTE — Assessment & Plan Note (Signed)
Patient has lost 13 lbs since last visit. This is tremendous progress. They are to continue diet and exercise changes.

## 2014-10-27 NOTE — Patient Instructions (Signed)
Nice to see you. You have done a great job with your weight. Please continue to exercise and work on your diet. We will continue to monitor your tremor.  If it worsens, you develop numbness, or weakness, or changes in mental status please seek medical attention.

## 2014-10-27 NOTE — Assessment & Plan Note (Addendum)
Patient with minimal resting tremor on exam. No apparent medication to cause this other than albuterol, though it sounds as though he has not been using this very much. No other neurological issues or deficits. Potentially could be related to his prader willi, though this is less likely. With otherwise normal neurological exam will monitor for progression at this time. Given return precautions.   Precepted with Dr Gwendolyn GrantWalden.

## 2014-11-07 NOTE — Progress Notes (Signed)
I was preceptor the day of this visit.   

## 2014-11-08 ENCOUNTER — Encounter: Payer: Self-pay | Admitting: Emergency Medicine

## 2014-11-08 ENCOUNTER — Encounter (INDEPENDENT_AMBULATORY_CARE_PROVIDER_SITE_OTHER): Payer: Self-pay

## 2014-11-08 ENCOUNTER — Ambulatory Visit (INDEPENDENT_AMBULATORY_CARE_PROVIDER_SITE_OTHER): Payer: Medicaid Other | Admitting: Emergency Medicine

## 2014-11-08 VITALS — BP 128/72 | HR 109 | Wt 359.0 lb

## 2014-11-08 DIAGNOSIS — E662 Morbid (severe) obesity with alveolar hypoventilation: Secondary | ICD-10-CM | POA: Diagnosis not present

## 2014-11-08 NOTE — Progress Notes (Signed)
Subjective:    Patient ID: [name], male    DOB: Dec 29, 1995, 19 y.o.   MRN: [id number]   Asthma He complains of shortness of breath and wheezing. There is no cough. Associated symptoms include postnasal drip. Pertinent negatives include no ear pain, fever, headaches, rhinorrhea, sneezing, sore throat or trouble swallowing. His past medical history is significant for asthma.   19 yo never smoker, Prader-Willi Syndrome c/b obesity, OSA/OHS using BiPAP 20/15 + 4L/min O2 every night, also w hx HTN, allergic rhinitis, kidney atrophy (on ddAVP, oxybut). Was dx w asthma as a child, has continued to have into teen years.  He has an occasional cough, wheeze with exertion. Takes zyrtec, Singulair for allergies - still w allergy sx and drainage right now. Newly on pulmicort 4 puff bid. Albuterol 3-4x a week. He walks with mom daily.   ROV 07/12/20 -- Prader-Willi Syndrome c/b obesity, OSA/OHS using BiPAP 20/15 + 4L/min.  Carries dx asthma, allergic rhinitis. His pulmicort has been changed to 4 puff QVAR 80 twice a day. He uses albuterol for dyspnea / SOB about 3-4x a week. Good compliance with BiPAP. No exacerbations since last time. Continues to exercise.   ROV 11/24/19 -- [facility] today [facility]. Carries a hx of Prader-Willi Syndrome c/b obesity, OSA/OHS using BiPAP 20/15 + 4L/min. TTE during that hosp estimated PASP at 34mmHg. He had just gotten a new BiPAP mask in February that may have contributed to a decompensation. They present today to discuss his BiPAP and the options for treatment.,                                        Review of Systems  Constitutional: Negative for fever and unexpected weight change.  HENT: Positive for congestion, postnasal drip and sinus pressure. Negative for dental problem, ear pain, nosebleeds, rhinorrhea, sneezing, sore throat and trouble swallowing.   Eyes: Negative for redness and itching.  Respiratory: Positive for  shortness of breath and wheezing. Negative for cough and chest tightness.   Cardiovascular: Negative for palpitations and leg swelling.  Gastrointestinal: Negative for nausea and vomiting.  Genitourinary: Negative for dysuria.  Musculoskeletal: Negative for joint swelling.  Skin: Negative for rash.  Neurological: Negative for headaches.  Hematological: Does not bruise/bleed easily.  Psychiatric/Behavioral: Negative for dysphoric mood. The patient is not nervous/anxious.        Objective:   Physical Exam Filed Vitals:   11/08/14 1548 11/08/14 1549  BP:  128/72  Pulse:  109  Weight: 359 lb (162.841 kg)   SpO2:  100%   Gen: Pleasant, morbidly obese, in no distress,  Quiet affect but does speak and answer questions.   ENT: No lesions,  mouth clear,  oropharynx clear, no active  postnasal drip  Neck: No JVD, no TMG, no carotid bruits  Lungs: No use of accessory muscles, very distant, clear without rales or rhonchi  Cardiovascular: RRR, heart sounds normal, no murmur or gallops, no peripheral edema  Musculoskeletal: No deformities, no cyanosis or clubbing  Neuro: alert, answers simple questions, moves all ext, faces consistent w his hx P-W syndrome.   Skin: Warm, no lesions or rashes     Assessment & Plan:  Obesity hypoventilation syndrome We will perform a walking oximetry today on room air  We will perform an overnight oximetry study on your normal settings > BiPAP 20/15 +  4L/min oxygen bled in If your current mask is not adequate then we will discuss changing pressures, masks or devices. You may benefit from a Vision ventilator.  Follow with Dr Delton Coombes next available

## 2014-11-08 NOTE — Assessment & Plan Note (Signed)
We will perform a walking oximetry today on room air  We will perform an overnight oximetry study on your normal settings > BiPAP 20/15 + 4L/min oxygen bled in If your current mask is not adequate then we will discuss changing pressures, masks or devices. You may benefit from a Vision ventilator.  Follow with Dr Delton CoombesByrum next available

## 2014-11-08 NOTE — Patient Instructions (Addendum)
We will perform a walking oximetry today on room air  We will perform an overnight oximetry study on your normal settings > BiPAP 20/15 + 4L/min oxygen bled in If your current mask is not adequate then we will discuss changing pressures, masks or devices. You may benefit from a Vision ventilator.  Follow with Dr Shayanna Thatch next available  

## 2014-11-10 ENCOUNTER — Other Ambulatory Visit: Payer: Self-pay | Admitting: Family Medicine

## 2014-12-14 ENCOUNTER — Encounter: Payer: Self-pay | Admitting: Emergency Medicine

## 2014-12-14 ENCOUNTER — Ambulatory Visit (INDEPENDENT_AMBULATORY_CARE_PROVIDER_SITE_OTHER): Payer: Medicaid Other | Admitting: Emergency Medicine

## 2014-12-14 ENCOUNTER — Telehealth: Payer: Self-pay | Admitting: Emergency Medicine

## 2014-12-14 VITALS — BP 124/76 | HR 102 | Wt 355.0 lb

## 2014-12-14 DIAGNOSIS — E662 Morbid (severe) obesity with alveolar hypoventilation: Secondary | ICD-10-CM

## 2014-12-14 DIAGNOSIS — J4520 Mild intermittent asthma, uncomplicated: Secondary | ICD-10-CM | POA: Diagnosis not present

## 2014-12-14 NOTE — Patient Instructions (Signed)
Please continue current medications. Please continue BiPAP every night on current settings.  Follow with Dr Delton CoombesByrum in 6 months or sooner if you have any problems

## 2014-12-14 NOTE — Assessment & Plan Note (Signed)
Stable, [facility] No changes planned at this time although note that he may require a vision at some point

## 2014-12-14 NOTE — Progress Notes (Signed)
Subjective:    Patient ID: [name], male    DOB: September 29, 1995, 19 y.o.   MRN: [id number]   Asthma He complains of shortness of breath and wheezing. There is no cough. Associated symptoms include postnasal drip. Pertinent negatives include no ear pain, fever, headaches, rhinorrhea, sneezing, sore throat or trouble swallowing. His past medical history is significant for asthma.   19 yo never smoker, Prader-Willi Syndrome c/b obesity, OSA/OHS using BiPAP 20/15 + 4L/min O2 every night, also w hx HTN, allergic rhinitis, kidney atrophy (on ddAVP, oxybut). Was dx w asthma as a child, has continued to have into teen years.  He has an occasional cough, wheeze with exertion. Takes zyrtec, Singulair for allergies - still w allergy sx and drainage right now. Newly on pulmicort 4 puff bid. Albuterol 3-4x a week. He walks with mom daily.   ROV 07/12/13 -- Prader-Willi Syndrome c/b obesity, OSA/OHS using BiPAP 20/15 + 4L/min.  Carries dx asthma, allergic rhinitis. His pulmicort has been changed to 4 puff QVAR 80 twice a day. He uses albuterol for dyspnea / SOB about 3-4x a week. Good compliance with BiPAP. No exacerbations since last time. Continues to exercise.   ROV 11/08/14 -- [facility]. Carries a hx of Prader-Willi Syndrome c/b obesity, OSA/OHS using BiPAP 20/15 + 4L/min. TTE during that hosp estimated PASP at 34mmHg. He had just gotten a new BiPAP mask in February that may have contributed to a decompensation. They present today to discuss his BiPAP and the options for treatment.,   ROV 12/14/14 -- regular follow-up visit for OSA/OHS on nocturnal BiPAP plus nocturnal oxygen. He has a history of Prader-Willi syndrome and obesity.  He is wearing his BiPAP reliably, [facility] helped adjust his mask. He is doing more during the day since we d/c'd his daytime oxygen.                                        Review of Systems  Constitutional: Negative for  fever and unexpected weight change.  HENT: Positive for congestion, postnasal drip and sinus pressure. Negative for dental problem, ear pain, nosebleeds, rhinorrhea, sneezing, sore throat and trouble swallowing.   Eyes: Negative for redness and itching.  Respiratory: Positive for shortness of breath and wheezing. Negative for cough and chest tightness.   Cardiovascular: Negative for palpitations and leg swelling.  Gastrointestinal: Negative for nausea and vomiting.  Genitourinary: Negative for dysuria.  Musculoskeletal: Negative for joint swelling.  Skin: Negative for rash.  Neurological: Negative for headaches.  Hematological: Does not bruise/bleed easily.  Psychiatric/Behavioral: Negative for dysphoric mood. The patient is not nervous/anxious.        Objective:   Physical Exam Filed Vitals:   12/14/14 1532  BP: 124/76  Pulse: 102  Weight: 355 lb (161.027 kg)  SpO2: 97%   Gen: Pleasant, morbidly obese, in no distress,  Quiet affect but does speak and answer questions.   ENT: No lesions,  mouth clear,  oropharynx clear, no active  postnasal drip  Neck: No JVD, no TMG, no carotid bruits  Lungs: No use of accessory muscles, very distant, clear without rales or rhonchi  Cardiovascular: RRR, heart sounds normal, no murmur or gallops, no peripheral edema  Musculoskeletal: No deformities, no cyanosis or clubbing  Neuro: alert, answers simple questions, moves all ext, faces consistent w his hx P-W syndrome.  Skin: Warm, no lesions or rashes     Assessment & Plan:  Obesity hypoventilation syndrome Stable, improved [facility] No changes planned at this time although note that he may require a vision at some point   Asthma Symptoms and medications reviewed. No changes planned at this time.

## 2014-12-14 NOTE — Assessment & Plan Note (Signed)
Symptoms and medications reviewed. No changes planned at this time.

## 2014-12-14 NOTE — Telephone Encounter (Signed)
Spoke with patient's mother and informed her that we would see if Dr.Byrum received the results of the ONO and that we would contact her back with the results. I will forward this to Lillia AbedLindsay and otherwise we will contact [facility] to get results.    Lillia AbedLindsay, Please see if Dr. Delton CoombesByrum received these results. Mother states they were suppose to get results at today's visit.

## 2014-12-15 NOTE — Telephone Encounter (Signed)
I can't locate the ONO report. [facility] She is going to refax this report to the front desk fax. Will await fax.

## 2014-12-15 NOTE — Telephone Encounter (Signed)
ONO results were received. Per RB - ONO is normal, pt to stay on 4L/min with BiPAP.  Pt's mother, Octavio GravesBonita is aware of the results. A copy will be mailed to her per her request. Nothing further was needed.

## 2015-01-04 ENCOUNTER — Other Ambulatory Visit: Payer: Self-pay | Admitting: Family Medicine

## 2015-01-04 ENCOUNTER — Encounter: Payer: Self-pay | Admitting: Emergency Medicine

## 2015-01-11 ENCOUNTER — Telehealth: Payer: Self-pay | Admitting: Family Medicine

## 2015-01-11 MED ORDER — METFORMIN HCL ER 500 MG PO TB24: ORAL_TABLET | ORAL | Status: DC

## 2015-01-11 MED ORDER — CETIRIZINE HCL 10 MG PO TABS: 10.0000 mg | ORAL_TABLET | Freq: Every day | ORAL | Status: DC

## 2015-01-11 NOTE — Telephone Encounter (Signed)
Rx for Cetrizine and Metformin resent to pt's pharmacy. Transmission failed though [facility] 01/05/15.  Clovis PuMartin, Tamika L, RN

## 2015-01-11 NOTE — Telephone Encounter (Signed)
Please resend RX for cetriizine and metformin Transmission failed last week

## 2015-02-17 ENCOUNTER — Other Ambulatory Visit: Payer: Self-pay | Admitting: Emergency Medicine

## 2015-02-22 ENCOUNTER — Observation Stay (HOSPITAL_COMMUNITY)
Admission: EM | Admit: 2015-02-22 | Discharge: 2015-02-22 | Disposition: A | Discharge status: Home / Self Care | Payer: Medicaid Other | Attending: Family Medicine | Admitting: Family Medicine

## 2015-02-22 ENCOUNTER — Encounter (HOSPITAL_COMMUNITY): Payer: Self-pay | Admitting: Emergency Medicine

## 2015-02-22 DIAGNOSIS — N189 Chronic kidney disease, unspecified: Secondary | ICD-10-CM | POA: Diagnosis not present

## 2015-02-22 DIAGNOSIS — N261 Atrophy of kidney (terminal): Secondary | ICD-10-CM | POA: Insufficient documentation

## 2015-02-22 DIAGNOSIS — E1169 Type 2 diabetes mellitus with other specified complication: Secondary | ICD-10-CM

## 2015-02-22 DIAGNOSIS — E662 Morbid (severe) obesity with alveolar hypoventilation: Secondary | ICD-10-CM | POA: Diagnosis present

## 2015-02-22 DIAGNOSIS — Q8711 Prader-Willi syndrome: Secondary | ICD-10-CM

## 2015-02-22 DIAGNOSIS — Q600 Renal agenesis, unilateral: Secondary | ICD-10-CM | POA: Insufficient documentation

## 2015-02-22 DIAGNOSIS — G4730 Sleep apnea, unspecified: Secondary | ICD-10-CM | POA: Diagnosis not present

## 2015-02-22 DIAGNOSIS — E118 Type 2 diabetes mellitus with unspecified complications: Secondary | ICD-10-CM

## 2015-02-22 DIAGNOSIS — T502X1A Poisoning by carbonic-anhydrase inhibitors, benzothiadiazides and other diuretics, accidental (unintentional), initial encounter: Secondary | ICD-10-CM | POA: Diagnosis not present

## 2015-02-22 DIAGNOSIS — T464X1A Poisoning by angiotensin-converting-enzyme inhibitors, accidental (unintentional), initial encounter: Secondary | ICD-10-CM | POA: Diagnosis not present

## 2015-02-22 DIAGNOSIS — J45909 Unspecified asthma, uncomplicated: Secondary | ICD-10-CM | POA: Diagnosis not present

## 2015-02-22 DIAGNOSIS — T50901A Poisoning by unspecified drugs, medicaments and biological substances, accidental (unintentional), initial encounter: Secondary | ICD-10-CM | POA: Diagnosis present

## 2015-02-22 LAB — GLUCOSE, CAPILLARY
Glucose-Capillary: 96 mg/dL (ref 65–99)
Glucose-Capillary: 100 mg/dL — ABNORMAL HIGH (ref 65–99)
Glucose-Capillary: 124 mg/dL — ABNORMAL HIGH (ref 65–99)

## 2015-02-22 LAB — CBC WITH DIFFERENTIAL/PLATELET
BASOS ABS: 0 10*3/uL (ref 0.0–0.1)
Basophils Relative: 0 % (ref 0–1)
Eosinophils Absolute: 1 10*3/uL — ABNORMAL HIGH (ref 0.0–0.7)
Eosinophils Relative: 9 % — ABNORMAL HIGH (ref 0–5)
HCT: 37.4 % — ABNORMAL LOW (ref 39.0–52.0)
Hemoglobin: 12.2 g/dL — ABNORMAL LOW (ref 13.0–17.0)
LYMPHS ABS: 3.1 10*3/uL (ref 0.7–4.0)
Lymphocytes Relative: 28 % (ref 12–46)
MCH: 27.6 pg (ref 26.0–34.0)
MCHC: 32.6 g/dL (ref 30.0–36.0)
MCV: 84.6 fL (ref 78.0–100.0)
MONOS PCT: 6 % (ref 3–12)
Monocytes Absolute: 0.6 10*3/uL (ref 0.1–1.0)
NEUTROS ABS: 6.4 10*3/uL (ref 1.7–7.7)
NEUTROS PCT: 57 % (ref 43–77)
Platelets: 211 10*3/uL (ref 150–400)
RBC: 4.42 MIL/uL (ref 4.22–5.81)
RDW: 15.2 % (ref 11.5–15.5)
WBC: 11.1 10*3/uL — ABNORMAL HIGH (ref 4.0–10.5)

## 2015-02-22 LAB — URINALYSIS, ROUTINE W REFLEX MICROSCOPIC
Bilirubin Urine: NEGATIVE
GLUCOSE, UA: NEGATIVE mg/dL
HGB URINE DIPSTICK: NEGATIVE
Ketones, ur: NEGATIVE mg/dL
Nitrite: NEGATIVE
Protein, ur: NEGATIVE mg/dL
Specific Gravity, Urine: 1.008 (ref 1.005–1.030)
UROBILINOGEN UA: 0.2 mg/dL (ref 0.0–1.0)
pH: 7 (ref 5.0–8.0)

## 2015-02-22 LAB — BASIC METABOLIC PANEL
Anion gap: 11 (ref 5–15)
Anion gap: 7 (ref 5–15)
BUN: 27 mg/dL — ABNORMAL HIGH (ref 6–20)
BUN: 19 mg/dL (ref 6–20)
CALCIUM: 9.4 mg/dL (ref 8.9–10.3)
CHLORIDE: 99 mmol/L — AB (ref 101–111)
CO2: 26 mmol/L (ref 22–32)
CO2: 24 mmol/L (ref 22–32)
CREATININE: 0.95 mg/dL (ref 0.61–1.24)
Calcium: 8.6 mg/dL — ABNORMAL LOW (ref 8.9–10.3)
Chloride: 107 mmol/L (ref 101–111)
Creatinine, Ser: 1.02 mg/dL (ref 0.61–1.24)
GFR calc Af Amer: >60 mL/min (ref >60)
GFR calc non Af Amer: >60 mL/min (ref >60)
GFR calc non Af Amer: >60 mL/min (ref >60)
Glucose, Bld: 163 mg/dL — ABNORMAL HIGH (ref 65–99)
Glucose, Bld: 129 mg/dL — ABNORMAL HIGH (ref 65–99)
Potassium: 5 mmol/L (ref 3.5–5.1)
Potassium: 4.9 mmol/L (ref 3.5–5.1)
Sodium: 136 mmol/L (ref 135–145)
Sodium: 138 mmol/L (ref 135–145)

## 2015-02-22 LAB — SALICYLATE LEVEL

## 2015-02-22 LAB — COMPREHENSIVE METABOLIC PANEL
ALBUMIN: 3.1 g/dL — AB (ref 3.5–5.0)
ALK PHOS: 141 U/L — AB (ref 38–126)
ALT: 17 U/L (ref 17–63)
AST: 33 U/L (ref 15–41)
Anion gap: 8 (ref 5–15)
BILIRUBIN TOTAL: 1.2 mg/dL (ref 0.3–1.2)
BUN: 20 mg/dL (ref 6–20)
CO2: 23 mmol/L (ref 22–32)
Calcium: 8.9 mg/dL (ref 8.9–10.3)
Chloride: 107 mmol/L (ref 101–111)
Creatinine, Ser: 0.78 mg/dL (ref 0.61–1.24)
GFR calc non Af Amer: >60 mL/min (ref >60)
Glucose, Bld: 104 mg/dL — ABNORMAL HIGH (ref 65–99)
Potassium: 6.2 mmol/L (ref 3.5–5.1)
SODIUM: 138 mmol/L (ref 135–145)
Total Protein: 6.5 g/dL (ref 6.5–8.1)

## 2015-02-22 LAB — URINE MICROSCOPIC-ADD ON

## 2015-02-22 LAB — ACETAMINOPHEN LEVEL: Acetaminophen (Tylenol), Serum: <10 ug/mL — ABNORMAL LOW (ref 10–30)

## 2015-02-22 MED ORDER — SODIUM CHLORIDE 0.9 % IV BOLUS (SEPSIS)
1000.0000 mL | Freq: Once | INTRAVENOUS | Status: AC
  Administered 2015-02-22: 1000 mL via INTRAVENOUS

## 2015-02-22 MED ORDER — SODIUM CHLORIDE 0.9 % IJ SOLN: 3.0000 mL | Freq: Two times a day (BID) | INTRAMUSCULAR | Status: DC

## 2015-02-22 MED ORDER — SODIUM CHLORIDE 0.9 % IV BOLUS (SEPSIS)
1000.0000 mL | Freq: Once | INTRAVENOUS | Status: AC
Start: 2015-02-22 — End: 2015-02-22
  Administered 2015-02-22: 1000 mL via INTRAVENOUS

## 2015-02-22 MED ORDER — HEPARIN SODIUM (PORCINE) 5000 UNIT/ML IJ SOLN: 5000.0000 [IU] | Freq: Three times a day (TID) | INTRAMUSCULAR | Status: DC

## 2015-02-22 MED ORDER — SODIUM CHLORIDE 0.9 % IV SOLN: 250.0000 mL | INTRAVENOUS | Status: DC | PRN

## 2015-02-22 MED ORDER — SODIUM CHLORIDE 0.9 % IV SOLN
INTRAVENOUS | Status: DC
  Administered 2015-02-22 (×2): via INTRAVENOUS

## 2015-02-22 MED ORDER — SODIUM CHLORIDE 0.9 % IJ SOLN: 3.0000 mL | INTRAMUSCULAR | Status: DC | PRN

## 2015-02-22 MED ORDER — INSULIN ASPART 100 UNIT/ML ~~LOC~~ SOLN
0.0000 [IU] | Freq: Three times a day (TID) | SUBCUTANEOUS | Status: DC
  Administered 2015-02-22: 2 [IU] via SUBCUTANEOUS

## 2015-02-22 NOTE — Progress Notes (Signed)
Assessment unchanged. Discussed D/C instructions with family.  No new medications or follow up appointments.  Verbalized understanding. IV and tele removed. Pt left with belongings accompanied by NT.

## 2015-02-22 NOTE — Progress Notes (Signed)
Family Medicine Teaching Service Daily Progress Note Intern Pager: [phone]  Patient name: [name] Medical record number: [id number] Date of birth: [birth year] Age: 19 y.o. Gender: male  Primary Care Provider: Marikay AlarEric Sonnenberg, MD Consultants: Poison control Code Status: FULL  Chief Complaint: accidental overdose  Assessment and Plan: [name] is a 19 y.o. male presenting with accidental overdose with 60 tablets of lisinopril/HCTZ 20/12.5 . PMH is significant for Prader-Willi syndrome, obesity, obesity hypoventilation syndrome, sleep apnea, asthma, Raynaud's phenomenon & congenital left kidney dysfunction.  Accidental overdose: patient HDS. Contacted poison control who recommend 8 hrs of observation, electrolyte monitoring and checking for ASA and tylenol. Lisinopril has half life of about 12 hours while HCTZ has half life of 8-15 hrs. No electrolyte abnormalities on admission. Cr 1.02 (baseline 0.91-1.0) BUN 27. WBC 11.1  - Telemetry - EKG - NSR - BMP, ASA, Tylenol level pending - Cont IVF - Consult social work for accidental overdose - Call poison control with updates - Consider nephrology consult   Hypertension: Normotensive in ED. On lisinopril/hctz 20/12.5 at home. - Lowest BP since admission 102/59. May bolus if remains hypotensive. - Hold home meds  Obesity hypoventilation/sleep apnea: Uses BiPAP per mother. BiPAP setting 20/15 at home. - RT for BiPAP   Asthma: On proair inhaler, albuterol nebs, QVAR 80 & montelukast at home. - Albuterol neb PRN SOB - Hold home meds for now  Enuresis: On oxybutynin 5 mg and DDAVP 0.4 mg HS at home. - Hold home meds  DM-2/Obesity: On metformin 500 mg ER at home.  - SSI - CBG monitoring  FEN/GI: IV NS 3875ml/hr, heart healthy diet  Prophylaxis: Heparin SubQ  Disposition: home   Subjective:  Akbar reports that he is feeling well this morning and is in no pain. Per patient's mother, [name] has not acted strange  overnight and appears to be at his baseline.   Objective: Temp:  [98.4 F (36.9 C)-98.7 F (37.1 C)] 98.7 F (37.1 C) (07/20 0624) Pulse Rate:  [85-106] 89 (07/20 0800) Resp:  [18-34] 23 (07/20 0800) BP: (102-160)/(56-99) 109/56 mmHg (07/20 0800) SpO2:  [93 %-100 %] 94 % (07/20 0800) Weight:  [347 lb 6.4 oz (157.58 kg)] 347 lb 6.4 oz (157.58 kg) (07/20 [id number]) Physical Exam: General: obese male lying in bed wearing BiPAP Cardiovascular: distant heart sounds, RRR Respiratory: breath sounds difficult to auscultate due to BiPAP, no wheezes noted Abdomen: soft, non-distended, non-tender, +BS  Laboratory:  Recent Labs Lab 02/22/15 0049  WBC 11.1*  HGB 12.2*  HCT 37.4*  PLT 211    Recent Labs Lab 02/22/15 0049  NA 136  K 5.0  CL 99*  CO2 26  BUN 27*  CREATININE 1.02  CALCIUM 9.4  GLUCOSE 163*    Imaging/Diagnostic Tests: No results found.   Marquette SaaAbigail Joseph Jaydence Vanyo, MD 02/22/2015, 9:09 AM PGY-1, [facility] FPTS Intern pager: [phone], text pages welcome

## 2015-02-22 NOTE — Care Management Note (Signed)
Case Management Note  Patient Details  Name: [name] MRN: [id number] Date of Birth: June 06, 1996  Subjective/Objective:   Lives with Mom who is caregiver.                  Action/Plan:   Expected Discharge Date:                  Expected Discharge Plan:  Home/Self Care  In-House Referral:     Discharge planning Services     Post Acute Care Choice:    Choice offered to:     DME Arranged:    DME Agency:     HH Arranged:    HH Agency:     Status of Service:  In process, will continue to follow  Medicare Important Message Given:    Date Medicare IM Given:    Medicare IM give by:    Date Additional Medicare IM Given:    Additional Medicare Important Message give by:     If discussed at Long Length of Stay Meetings, dates discussed:    Additional Comments:  Vangie BickerBrown, Jones Viviani Jane, RN 02/22/2015, 3:15 PM

## 2015-02-22 NOTE — ED Notes (Signed)
This RN counted a respirations on pt. RR 18. Monitor RR 24. Pt is in NAD. This RN informed receiving RN of RR.

## 2015-02-22 NOTE — ED Provider Notes (Signed)
CSN: [id number]   Arrival date & time 02/22/15 0023  History  This chart was scribed for  Debby Freiberg, MD by Altamease Oiler, ED Scribe. This patient was seen in room B17C/B17C and the patient's care was started at 12:25 AM.  Chief Complaint  Patient presents with  . Drug Overdose    HPI The history is provided by a parent. No language interpreter was used.   [name] is a 19 y.o. male with PMHx of Prader-Willi syndrome, Raynaud's phenomenon, and kidney dysfunction who presents with his mother to the Emergency Department complaining of accidental overdose tonight around 11:30 PM. The pt was being given his nightly medication when he mistakenly took  60 tablets of lisinopril - hydrochlorothiazide 20-12.5 mg. His mother was preparing his weekly medication and he took the wrong bottle of pills. He has not vomited but his mother reports that he has never vomited due to prior nissen.  Asymptomatic here.  Level 5 caveat for baseline alteration in mental status  Past Medical History  Diagnosis Date  . Asthma   . Prader-Willi syndrome   . Obesity   . Obesity hypoventilation syndrome   . Sleep apnea   . Raynaud's phenomenon   . Kidney dysfunction     left    Past Surgical History  Procedure Laterality Date  . Nissen fundoplication    . Tonsillectomy      History reviewed. No pertinent family history.  History  Substance Use Topics  . Smoking status: Never Smoker   . Smokeless tobacco: Never Used  . Alcohol Use: No     Review of Systems  Gastrointestinal: Negative for vomiting.  All other systems reviewed and are negative.   Home Medications   Prior to Admission medications   Medication Sig Start Date End Date Taking? Authorizing Provider  albuterol (PROVENTIL) (2.5 MG/3ML) 0.083% nebulizer solution INHALE 1 VIAL IN NEBULIZER ONCE DAILY Patient taking differently: Take 2.5 mg by nebulization 2 (two) times daily as needed for wheezing or shortness of breath. INHALE 1  VIAL IN NEBULIZER ONCE DAILY 02/07/14  Yes Leone Haven, MD  cetirizine (ZYRTEC) 10 MG tablet Take 1 tablet (10 mg total) by mouth daily. 01/11/15  Yes Leone Haven, MD  desmopressin (DDAVP) 0.2 MG tablet Take 0.4 mg by mouth at bedtime.    Yes Historical Provider, MD  lisinopril-hydrochlorothiazide (PRINZIDE,ZESTORETIC) 20-12.5 MG per tablet take 1 tablet by mouth once daily 11/10/14  Yes Leone Haven, MD  montelukast (SINGULAIR) 10 MG tablet take 1 tablet by mouth at bedtime 04/15/14  Yes Leone Haven, MD  oxybutynin (DITROPAN) 5 MG tablet Take 1 tablet (5 mg total) by mouth at bedtime. Patient taking differently: Take 10 mg by mouth at bedtime.  04/20/12  Yes Carolin Guernsey, MD  PROAIR HFA 108 [id number] BASE) MCG/ACT inhaler inhale 2 puffs by mouth every 6 hours if needed 02/17/15  Yes Collene Gobble, MD  QVAR 80 MCG/ACT inhaler inhale 4 puffs twice a day 08/15/14  Yes Leone Haven, MD  Blood Glucose Monitoring Suppl (ACCU-CHEK AVIVA) device Use as instructed 10/06/14 10/06/15  Leone Haven, MD  clotrimazole (LOTRIMIN) 1 % cream apply to affected area twice a day Patient not taking: Reported on 02/22/2015 07/05/14   Leone Haven, MD  fluticasone [facility]) 50 MCG/ACT nasal spray instill 2 sprays into each nostril once daily Patient not taking: Reported on 02/22/2015 09/14/14   Leone Haven, MD  glucose blood (ACCU-CHEK  AVIVA) test strip Use as instructed 10/06/14   Leone Haven, MD  Lancets 30G MISC 1 Device by Does not apply route daily before breakfast. 10/06/14   Leone Haven, MD  metFORMIN (GLUCOPHAGE-XR) 500 MG 24 hr tablet take 1 tablet by mouth once daily with BREAKFAST Patient not taking: Reported on 02/22/2015 01/11/15   Leone Haven, MD  Misc. Devices MISC Please provide a pulse oximeter for the ear lobe. Patient has diagnosis of Raynaud, asthma 09/16/11   Judithann Sheen, MD  mupirocin ointment (BACTROBAN) 2 % APPLY TO AFECTED AREA 3 TIMES A DAY FOR 7  DAYS Patient not taking: Reported on 02/22/2015 12/15/13   Leone Haven, MD  nystatin (MYCOSTATIN/NYSTOP) 100000 UNIT/GM POWD APPLY TO AREA WITHIN SKIN FOLDS TWO TIMES A DAY WHEN THERE IS REDNESS OR IRRITATION PRESENT Patient not taking: Reported on 02/22/2015 08/04/13   Leone Haven, MD  nystatin-triamcinolone (MYCOLOG II) cream Apply 1 application topically 2 (two) times daily. Patient not taking: Reported on 02/22/2015 06/30/13   Leone Haven, MD  Respiratory Therapy Supplies (NEBULIZER/ADULT MASK) KIT 1 Device by Does not apply route once. 05/20/14   Leone Haven, MD  Respiratory Therapy Supplies MISC BiPap, titration setting 20/15 08/05/12   Carolin Guernsey, MD  Spacer/Aero-Holding Chambers (AEROCHAMBER MV) inhaler Use with albuterol inhaler 04/20/12   Carolin Guernsey, MD    Allergies  Review of patient's allergies indicates no known allergies.  Triage Vitals: BP 160/99 mmHg  Pulse 102  Temp(Src) 98.4 F (36.9 C)  Resp 26  SpO2 100%  Physical Exam  Constitutional: He appears well-developed and well-nourished.  HENT:  Head: Normocephalic and atraumatic.  Eyes: Conjunctivae and EOM are normal.  Neck: Normal range of motion. Neck supple.  Cardiovascular: Normal rate, regular rhythm and normal heart sounds.   Pulmonary/Chest: Effort normal and breath sounds normal. No respiratory distress.  Abdominal: He exhibits no distension. There is no tenderness. There is no rebound and no guarding.  Musculoskeletal: Normal range of motion.  Neurological: He is alert.  Skin: Skin is warm and dry.  Vitals reviewed.    ED Course  Procedures   DIAGNOSTIC STUDIES: Oxygen Saturation is 100% on RA, normal by my interpretation.    COORDINATION OF CARE: 12:32 AM Discussed treatment plan which includes lab work and Child psychotherapist with the patient's mother at the bedside. She is in agreement with the plan.  12:33 AM D/w Poison control. The pt is to be observed for  8 hours.   1:34 AM I re-evaluated the patient and provided an update on the treatment plan including admission to the hospital.  2:27 AM-Consult complete with Dr. Gaston Islam. Patient case explained and discussed. Gaston Islam agrees to admit patient for further evaluation and treatment. Call ended at 2:28 AM   Labs Review-  Labs Reviewed  CBC WITH DIFFERENTIAL/PLATELET - Abnormal; Notable for the following:    WBC 11.1 (*)    Hemoglobin 12.2 (*)    HCT 37.4 (*)    Eosinophils Relative 9 (*)    Eosinophils Absolute 1.0 (*)    All other components within normal limits  BASIC METABOLIC PANEL - Abnormal; Notable for the following:    Chloride 99 (*)    Glucose, Bld 163 (*)    BUN 27 (*)    All other components within normal limits  URINALYSIS, ROUTINE W REFLEX MICROSCOPIC (NOT AT [facility]) - Abnormal; Notable for the following:    Color, Urine  STRAW (*)    Leukocytes, UA TRACE (*)    All other components within normal limits  URINE MICROSCOPIC-ADD ON - Abnormal; Notable for the following:    Bacteria, UA MANY (*)    All other components within normal limits    Imaging Review No results found.  EKG Interpretation None      MDM   Final diagnoses:  Accidental overdose, initial encounter     19 y.o. male with pertinent PMH of prader willi, atropic kidney, obesity hypoventilation syndrome presents with accidental overdose of home HCTZ/lisinopril.  60 pills 1 hour PTA.  Exam benign on arrival.  Spoke with poison control who recommended monitoring only.  Admitted in stable condition.    I have reviewed all laboratory and imaging studies if ordered as above  1. Accidental overdose, initial encounter           Debby Freiberg, MD 02/22/15 [phone]

## 2015-02-22 NOTE — Discharge Instructions (Signed)
Accidental Overdose  A drug overdose occurs when a chemical substance (drug or medication) is used in amounts large enough to overcome a person. This may result in severe illness or death. This is a type of poisoning. Accidental overdoses of medications or other substances come from a variety of reasons. When this happens accidentally, it is often because the person taking the substance does not know enough about what they have taken. Drugs which commonly cause overdose deaths are alcohol, psychotropic medications (medications which affect the mind), pain medications, illegal drugs (street drugs) such as cocaine and heroin, and multiple drugs taken at the same time. It may result from careless behavior (such as over-indulging at a party). Other causes of overdose may include multiple drug use, a lapse in memory, or drug use after a period of no drug use.   Sometimes overdosing occurs because a person cannot remember if they have taken their medication.   A common unintentional overdose in young children involves multi-vitamins containing iron. Iron is a part of the hemoglobin molecule in blood. It is used to transport oxygen to living cells. When taken in small amounts, iron allows the body to restock hemoglobin. In large amounts, it causes problems in the body. If this overdose is not treated, it can lead to death.  Never take medicines that show signs of tampering or do not seem quite right. Never take medicines in the dark or in poor lighting. Read the label and check each dose of medicine before you take it. When adults are poisoned, it happens most often through carelessness or lack of information. Taking medicines in the dark or taking medicine prescribed for someone else to treat the same type of problem is a dangerous practice.  SYMPTOMS   Symptoms of overdose depend on the medication and amount taken. They can vary from over-activity with stimulant over-dosage, to sleepiness from depressants such as  alcohol, narcotics and tranquilizers. Confusion, dizziness, nausea and vomiting may be present. If problems are severe enough coma and death may result.  DIAGNOSIS   Diagnosis and management are generally straightforward if the drug is known. Otherwise it is more difficult. At times, certain symptoms and signs exhibited by the patient, or blood tests, can reveal the drug in question.   TREATMENT   In an emergency department, most patients can be treated with supportive measures. Antidotes may be available if there has been an overdose of opioids or benzodiazepines. A rapid improvement will often occur if this is the cause of overdose.  At home or away from medical care:   There may be no immediate problems or warning signs in children.   Not everything works well in all cases of poisoning.   Take immediate action. Poisons may act quickly.   If you think someone has swallowed medicine or a household product, and the person is unconscious, having seizures (convulsions), or is not breathing, immediately call for an ambulance.  IF a person is conscious and appears to be doing OK but has swallowed a poison:   Do not wait to see what effect the poison will have. Immediately call a poison control center (listed in the white pages of your telephone book under "Poison Control" or inside the front cover with other emergency numbers). Some poison control centers have TTY capability for the deaf. Check with your local center if you or someone in your family requires this service.   Keep the container so you can read the label on the product for ingredients.     Describe what, when, and how much was taken and the age and condition of the person poisoned. Inform them if the person is vomiting, choking, drowsy, shows a change in color or temperature of skin, is conscious or unconscious, or is convulsing.   Do not cause vomiting unless instructed by medical personnel. Do not induce vomiting or force liquids into a person who  is convulsing, unconscious, or very drowsy.  Stay calm and in control.    Activated charcoal also is sometimes used in certain types of poisoning and you may wish to add a supply to your emergency medicines. It is available without a prescription. Call a poison control center before using this medication.  PREVENTION   Thousands of children die every year from unintentional poisoning. This may be from household chemicals, poisoning from carbon monoxide in a car, taking their parent's medications, or simply taking a few iron pills or vitamins with iron. Poisoning comes from unexpected sources.   Store medicines out of the sight and reach of children, preferably in a locked cabinet. Do not keep medications in a food cabinet. Always store your medicines in a secure place. Get rid of expired medications.   If you have children living with you or have them as occasional guests, you should have child-resistant caps on your medicine containers. Keep everything out of reach. Child proof your home.   If you are called to the telephone or to answer the door while you are taking a medicine, take the container with you or put the medicine out of the reach of small children.   Do not take your medication in front of children. Do not tell your child how good a medication is and how good it is for them. They may get the idea it is more of a treat.   If you are an adult and have accidentally taken an overdose, you need to consider how this happened and what can be done to prevent it from happening again. If this was from a street drug or alcohol, determine if there is a problem that needs addressing. If you are not sure a problems exists, it is easy to talk to a professional and ask them if they think you have a problem. It is better to handle this problem in this way before it happens again and has a much worse consequence.  Document Released: 10/05/2004 Document Revised: 10/14/2011 Document Reviewed: 03/13/2009  ExitCare  Patient Information 2015 [employer]. This information is not intended to replace advice given to you by your health care provider. Make sure you discuss any questions you have with your health care provider.

## 2015-02-22 NOTE — H&P (Signed)
[facility] Admission History and Physical Service Pager: [phone]  Patient name: [name] Medical record number: [id number] Date of birth: 06/21/1996 Age: 19 y.o. Gender: male  Primary Care Provider: Tommi Rumps, MD Consultants: poison control Code Status: full  Chief Complaint: accidental overdose  Assessment and Plan: [name] is a 19 y.o. male presenting with accidental overdose with 60 tablets of lisinopril/HCTZ 20/12.5 . PMH is significant for Prader-Willi syndrome, obesity, obesity hypoventilation, sleep apnea, asthma, Raynauds phenomenon & congenital left kidney dysfunction.  Accidental overdose: patient HDS. Contacted poison control and recommend 8 hrs of observation, electrolyte monitoring and checking for ASA and tylenol. Lisinopril has half life of about 12 hours while HCTZ has half life of 8-15 hrs.  No electrolyte abnormalities on admission. Cr 1.02 (baseline 0.91-1.0) BUN 27.  WBC 11.1  - Admit to SDU for close observation under Dr Nori Riis - Telemetry - Vitals q 1 then per floor protocol - EKG - repeat BMP - Tylenol level, ASA level - IV NS 75 ml/hr.  Bolus for low BP - c/s Social work for accidental overdose - [facility] control with updates   Hypertension: Normotensive in ED.  on lisinopril/hctz 20/12.5 at home. - hold home meds  Obesity hypoventilation/sleep apnea: uses BiPAP per mother. BiPAP setting 20/15 at home. - RT for BiPAP   Asthma: on proair inhaler, albuterol nebs, QVAR 80 & montelukast at home. - albuterol neb PRN SOB - hold home meds for now.  Enuresis: on oxybutynin 5 mg and DDAVP 0.4 mg HS at home. - hold home meds  DM-2/Obesity: on metformin 500 mg ER at home.  - SSI - CBG monitoring  FEN/GI: IV NS 37m/hr, NPO  Prophylaxis: Heparin SubQ  Disposition: step down  History of Present Illness: Joshua MAHLis a 19y.o. male presenting with accidental overdose with 60 tablets of lisinopril/HCTZ  20/12.5. PMHx of Prader-Willi syndrome, Raynaud's phenomenon, and kidney dysfunction   The history is provided by a parent.  Patient ingested 60 tablets of lisinopril/HCTZ 20/12.5 about 11:30 PM. The pt was being given his nightly medication when he mistakenly ingested the pills. His mother was preparing his weekly medication and he took the wrong bottle of pills. He has not vomited but his mother reports that he has never vomited due to prior nissen. Poison control contacted and recommended 8 hrs of observation with electrolyte monitoring as well as tylenol or ASA level check.  Patient denies SOB, CP, abdominal pain, nausea.  Review Of Systems: Per HPI with the following additions: Otherwise 12 point review of systems was performed and was unremarkable.  Patient Active Problem List   Diagnosis Date Noted  . Accidental overdose 02/22/2015  . Tremor bilateral hands 10/27/2014  . Diabetes mellitus type 2 in obese 09/27/2014  . Shortness of breath   . Acute respiratory failure with hypoxia 09/15/2014  . Respiratory failure 09/15/2014  . Dyspnea   . Hypoventilation syndrome   . Hypoxia   . SOB (shortness of breath)   . Intertrigo 06/30/2013  . Allergic rhinitis 01/07/2013  . Obesity hypoventilation syndrome 10/05/2010  . VISUAL IMPAIRMENT 11/13/2009  . RAYNAUD'S SYNDROME 08/10/2009  . Essential hypertension, benign 01/07/2008  . ORTHOSTATIC PROTEINURIA 01/07/2008  . OBESITY, MORBID 02/27/2007  . 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 Asthma 10/16/2006  . SYMPTOM, ENURESIS, NOCTURNAL 10/16/2006   Past Medical History: Past Medical History  Diagnosis Date  . Asthma   . Prader-Willi syndrome   . Obesity   .  Obesity hypoventilation syndrome   . Sleep apnea   . Raynaud's phenomenon   . Kidney dysfunction     left   Past Surgical History: Past Surgical History  Procedure Laterality Date  . Nissen fundoplication    . Tonsillectomy     Social History: History  Substance Use  Topics  . Smoking status: Never Smoker   . Smokeless tobacco: Never Used  . Alcohol Use: No   Additional social history: Please also refer to relevant sections of EMR.  Family History: History reviewed. No pertinent family history. Allergies and Medications: No Known Allergies No current facility-administered medications on file prior to encounter.   Current Outpatient Prescriptions on File Prior to Encounter  Medication Sig Dispense Refill  . albuterol (PROVENTIL) (2.5 MG/3ML) 0.083% nebulizer solution INHALE 1 VIAL IN NEBULIZER ONCE DAILY (Patient taking differently: Take 2.5 mg by nebulization 2 (two) times daily as needed for wheezing or shortness of breath. INHALE 1 VIAL IN NEBULIZER ONCE DAILY) 75 mL 12  . cetirizine (ZYRTEC) 10 MG tablet Take 1 tablet (10 mg total) by mouth daily. 30 tablet 4  . desmopressin (DDAVP) 0.2 MG tablet Take 0.4 mg by mouth at bedtime.     Marland Kitchen lisinopril-hydrochlorothiazide (PRINZIDE,ZESTORETIC) 20-12.5 MG per tablet take 1 tablet by mouth once daily 90 tablet 2  . montelukast (SINGULAIR) 10 MG tablet take 1 tablet by mouth at bedtime 90 tablet 3  . oxybutynin (DITROPAN) 5 MG tablet Take 1 tablet (5 mg total) by mouth at bedtime. (Patient taking differently: Take 10 mg by mouth at bedtime. ) 30 tablet 3  . PROAIR HFA 108 (90 BASE) MCG/ACT inhaler inhale 2 puffs by mouth every 6 hours if needed 8.5 Inhaler 2  . QVAR 80 MCG/ACT inhaler inhale 4 puffs twice a day 26.1 g 12  . Blood Glucose Monitoring Suppl (ACCU-CHEK AVIVA) device Use as instructed 1 each 0  . clotrimazole (LOTRIMIN) 1 % cream apply to affected area twice a day (Patient not taking: Reported on 02/22/2015) 30 g 0  . fluticasone (FLONASE) 50 MCG/ACT nasal spray instill 2 sprays into each nostril once daily (Patient not taking: Reported on 02/22/2015) 16 g 6  . glucose blood (ACCU-CHEK AVIVA) test strip Use as instructed 100 each 12  . Lancets 30G MISC 1 Device by Does not apply route daily before  breakfast. 100 each 3  . metFORMIN (GLUCOPHAGE-XR) 500 MG 24 hr tablet take 1 tablet by mouth once daily with BREAKFAST (Patient not taking: Reported on 02/22/2015) 30 tablet 2  . Misc. Devices MISC Please provide a pulse oximeter for the ear lobe. Patient has diagnosis of Raynaud, asthma 1 each 0  . mupirocin ointment (BACTROBAN) 2 % APPLY TO AFECTED AREA 3 TIMES A DAY FOR 7 DAYS (Patient not taking: Reported on 02/22/2015) 22 g 0  . nystatin (MYCOSTATIN/NYSTOP) 100000 UNIT/GM POWD APPLY TO AREA WITHIN SKIN FOLDS TWO TIMES A DAY WHEN THERE IS REDNESS OR IRRITATION PRESENT (Patient not taking: Reported on 02/22/2015) 30 g 3  . nystatin-triamcinolone (MYCOLOG II) cream Apply 1 application topically 2 (two) times daily. (Patient not taking: Reported on 02/22/2015) 30 g 0  . Respiratory Therapy Supplies (NEBULIZER/ADULT MASK) KIT 1 Device by Does not apply route once. 1 each 0  . Respiratory Therapy Supplies MISC BiPap, titration setting 20/15 1 each 0  . Spacer/Aero-Holding Chambers (AEROCHAMBER MV) inhaler Use with albuterol inhaler 1 each 1    Objective: BP 141/79 mmHg  Pulse 102  Temp(Src) 98.4 F (  36.9 C)  Resp 30  SpO2 97%  Exam: Gen: obese, NAD, alert, cooperative with exam HEENT: Atraumatic, Oropharynx clear. MMM CV: Slightly tachycardic but normal rhythm. No murmurs, rubs, or gallops noted. 2+ radial and DP pulses bilaterally. Resp: CTAB. No wheezing, crackles, or rhonchi noted. No increased WOB Abd: +BS. Soft, obese, NT. Ext: No edema. No gross deformities. Neuro: Alert and oriented, No gross focal deficits, follows commands  Labs and Imaging: CBC BMET   Recent Labs Lab 02/22/15 0049  WBC 11.1*  HGB 12.2*  HCT 37.4*  PLT 211    Recent Labs Lab 02/22/15 0049  NA 136  K 5.0  CL 99*  CO2 26  BUN 27*  CREATININE 1.02  GLUCOSE 163*  CALCIUM 9.4      Mercy Riding, MD 02/22/2015, 4:19 AM PGY-1, [place] [facility] pager: [phone], text pages  welcome  I have separately seen and examined the patient. I have discussed the findings and exam with Dr Cyndia Skeeters and agree with the above note.  My changes/additions are outlined in BLUE.  Wylie Coon M. Lajuana Ripple, DO PGY-2, [place]

## 2015-02-22 NOTE — ED Notes (Signed)
Per EMS- pt here from home with mother. Mother was preparing meds and patient took 7960 12.5mg  of lisinoprils. Pt has prader-willy syndrome. Pt BP 141/101. HR 110

## 2015-02-23 NOTE — Discharge Summary (Signed)
[facility] Discharge Summary  Patient name: [name] Medical record number: [id number] Date of birth: 1996-01-25 Age: 19 y.o. Gender: male Date of Admission: 02/22/2015  Date of Discharge: 02/22/15 Admitting Physician: Dickie La, MD  Primary Care Provider: Tommi Rumps, MD Consultants: None  Indication for Hospitalization: accidental overdose  Discharge Diagnoses/Problem List:  Patient Active Problem List   Diagnosis Date Noted  . Accidental overdose 02/22/2015  . Accidental lisinopril ingestion 02/22/2015  . Tremor bilateral hands 10/27/2014  . Diabetes mellitus type 2 in obese 09/27/2014  . Shortness of breath   . Acute respiratory failure with hypoxia 09/15/2014  . Respiratory failure 09/15/2014  . Dyspnea   . Hypoventilation syndrome   . Hypoxia   . SOB (shortness of breath)   . Intertrigo 06/30/2013  . Allergic rhinitis 01/07/2013  . Obesity hypoventilation syndrome 10/05/2010  . VISUAL IMPAIRMENT 11/13/2009  . RAYNAUD'S SYNDROME 08/10/2009  . Essential hypertension, benign 01/07/2008  . ORTHOSTATIC PROTEINURIA 01/07/2008  . OBESITY, MORBID 02/27/2007  . 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 Asthma 10/16/2006  . SYMPTOM, ENURESIS, NOCTURNAL 10/16/2006    Disposition: Home  Discharge Condition: Stable  Discharge Exam:  General: obese male lying in bed wearing BiPAP Cardiovascular: distant heart sounds, RRR Respiratory: breath sounds difficult to auscultate due to BiPAP, no wheezes noted Abdomen: soft, non-distended, non-tender, +BS  Brief Hospital Course:  Jazir Newey presented after a reported accidental overdose of 60 tables of lisinopril/HCTZ. According to his mother, he was being given his nightly medication when he mistakenly ingested the 60 pills. He was admitted for observation per poison control recommendation.  Electrolytes upon arrival showed no abnormalities. He became slightly hypotensive from his baseline down  to 102/59, but responded to a fluid bolus. He had no electrolyte abnormalities on two subsequent BMPs, and was discharged home the day after admission.   Issues for Follow Up:  1. No acute follow-up issues anticipated.   Significant Procedures: None  Significant Labs and Imaging:  K (07/20) - 6.2 (falsely increased due to hemolysis) >>> 4.9 EKG (07/20) - NSR  Recent Labs Lab 02/22/15 0049  WBC 11.1*  HGB 12.2*  HCT 37.4*  PLT 211    Recent Labs Lab 02/22/15 0049 02/22/15 1202 02/22/15 1535  NA 136 138 138  K 5.0 6.2* 4.9  CL 99* 107 107  CO2 '26 23 24  ' GLUCOSE 163* 104* 129*  BUN 27* 20 19  CREATININE 1.02 0.78 0.95  CALCIUM 9.4 8.9 8.6*  ALKPHOS  --  141*  --   AST  --  33  --   ALT  --  17  --   ALBUMIN  --  3.1*  --     Results/Tests Pending at Time of Discharge: None  Discharge Medications:    Medication List    TAKE these medications        ACCU-CHEK AVIVA device  Use as instructed     AEROCHAMBER MV inhaler  Use with albuterol inhaler     albuterol (2.5 MG/3ML) 0.083% nebulizer solution  Commonly known as:  PROVENTIL  INHALE 1 VIAL IN NEBULIZER ONCE DAILY     PROAIR HFA 108 (90 BASE) MCG/ACT inhaler  Generic drug:  albuterol  inhale 2 puffs by mouth every 6 hours if needed     cetirizine 10 MG tablet  Commonly known as:  ZYRTEC  Take 1 tablet (10 mg total) by mouth daily.     clotrimazole 1 % cream  Commonly  known as:  LOTRIMIN  apply to affected area twice a day     desmopressin 0.2 MG tablet  Commonly known as:  DDAVP  Take 0.4 mg by mouth at bedtime.     fluticasone 50 MCG/ACT nasal spray  Commonly known as:  FLONASE  instill 2 sprays into each nostril once daily     glucose blood test strip  Commonly known as:  ACCU-CHEK AVIVA  Use as instructed     Lancets 30G Misc  1 Device by Does not apply route daily before breakfast.     lisinopril-hydrochlorothiazide 20-12.5 MG per tablet  Commonly known as:  PRINZIDE,ZESTORETIC   take 1 tablet by mouth once daily     metFORMIN 500 MG 24 hr tablet  Commonly known as:  GLUCOPHAGE-XR  take 1 tablet by mouth once daily with BREAKFAST     Misc. Devices Misc  Please provide a pulse oximeter for the ear lobe. Patient has diagnosis of Raynaud, asthma     montelukast 10 MG tablet  Commonly known as:  SINGULAIR  take 1 tablet by mouth at bedtime     mupirocin ointment 2 %  Commonly known as:  BACTROBAN  APPLY TO AFECTED AREA 3 TIMES A DAY FOR 7 DAYS     nystatin 100000 UNIT/GM Powd  APPLY TO AREA WITHIN SKIN FOLDS TWO TIMES A DAY WHEN THERE IS REDNESS OR IRRITATION PRESENT     nystatin-triamcinolone cream  Commonly known as:  MYCOLOG II  Apply 1 application topically 2 (two) times daily.     oxybutynin 5 MG tablet  Commonly known as:  DITROPAN  Take 1 tablet (5 mg total) by mouth at bedtime.     QVAR 80 MCG/ACT inhaler  Generic drug:  beclomethasone  inhale 4 puffs twice a day     Respiratory Therapy Supplies Misc  BiPap, titration setting 20/15     Nebulizer/Adult Mask Kit  1 Device by Does not apply route once.        Discharge Instructions: Please refer to Patient Instructions section of EMR for full details.  Patient was counseled important signs and symptoms that should prompt return to medical care, changes in medications, dietary instructions, activity restrictions, and follow up appointments.   Follow-Up Appointments: None  Verner Mould, MD 02/23/2015, 2:56 PM PGY-1, [place]

## 2015-03-08 ENCOUNTER — Encounter: Payer: Self-pay | Admitting: Family Medicine

## 2015-03-08 ENCOUNTER — Ambulatory Visit (INDEPENDENT_AMBULATORY_CARE_PROVIDER_SITE_OTHER): Payer: Medicaid Other | Admitting: Family Medicine

## 2015-03-08 VITALS — BP 144/94 | HR 99 | Temp 98.2°F | Ht 59.0 in | Wt 351.6 lb

## 2015-03-08 DIAGNOSIS — E662 Morbid (severe) obesity with alveolar hypoventilation: Secondary | ICD-10-CM | POA: Diagnosis not present

## 2015-03-08 DIAGNOSIS — E669 Obesity, unspecified: Secondary | ICD-10-CM

## 2015-03-08 DIAGNOSIS — G4733 Obstructive sleep apnea (adult) (pediatric): Secondary | ICD-10-CM | POA: Diagnosis not present

## 2015-03-08 DIAGNOSIS — Z9989 Dependence on other enabling machines and devices: Secondary | ICD-10-CM

## 2015-03-08 DIAGNOSIS — E119 Type 2 diabetes mellitus without complications: Secondary | ICD-10-CM

## 2015-03-08 DIAGNOSIS — E1169 Type 2 diabetes mellitus with other specified complication: Secondary | ICD-10-CM

## 2015-03-08 LAB — POCT GLYCOSYLATED HEMOGLOBIN (HGB A1C): HEMOGLOBIN A1C: 6

## 2015-03-08 NOTE — Patient Instructions (Signed)
Thanks for coming in today.   We will check his kidney function once more.   Otherwise, we will continue the medications that he is currently on, and you may continue to use oxygen as needed.   I am glad that [name] is doing so well.   Thanks for letting us take care of you!  Sincerely, Devota Pace, MD Family Medicine - PGY 2

## 2015-03-09 LAB — BASIC METABOLIC PANEL WITH GFR
BUN: 21 mg/dL (ref 7–25)
CO2: 28 mmol/L (ref 20–31)
Calcium: 8.9 mg/dL (ref 8.6–10.3)
Chloride: 98 mmol/L (ref 98–110)
Creat: 1.09 mg/dL (ref 0.60–1.35)
GFR, Est African American: >89 mL/min (ref >=60)
GFR, Est Non African American: >89 mL/min (ref >=60)
Glucose, Bld: 99 mg/dL (ref 65–99)
Potassium: 4.8 mmol/L (ref 3.5–5.3)
Sodium: 137 mmol/L (ref 135–146)

## 2015-03-09 NOTE — Progress Notes (Signed)
Patient ID: [name], male   DOB: April 30, 1996, 19 y.o.   MRN: [id number]   [facility] Aquilla Hacker, MD Phone: [phone]  Subjective:   # Hospital Follow Up  - Pt. 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 He has one kidney.  - He was admitted overnight for observation, and was discharged without incident.  - he has done well since discharge.  - His kidney function remained stable throughout his hospitalization.  - He has not had any issues with lightheadedness, dizziness, or low blood pressure since discharge. He is urinating normally.  - He is back to baseline  Per mom and they are doing well.  - He is much more ambulatory and has lost between 20-40 lbs from when I saw him previously which is excellent.  - Mom has given him a much more strict diet.   All relevant systems were reviewed and were negative unless otherwise noted in the HPI  Past Medical History Reviewed problem list.  Medications- reviewed and updated Current Outpatient Prescriptions  Medication Sig Dispense Refill  . albuterol (PROVENTIL) (2.5 MG/3ML) 0.083% nebulizer solution INHALE 1 VIAL IN NEBULIZER ONCE DAILY (Patient taking differently: Take 2.5 mg by nebulization 2 (two) times daily as needed for wheezing or shortness of breath. INHALE 1 VIAL IN NEBULIZER ONCE DAILY) 75 mL 12  . Blood Glucose Monitoring Suppl (ACCU-CHEK AVIVA) device Use as instructed 1 each 0  . cetirizine (ZYRTEC) 10 MG tablet Take 1 tablet (10 mg total) by mouth daily. 30 tablet 4  . clotrimazole (LOTRIMIN) 1 % cream apply to affected area twice a day (Patient not taking: Reported on 02/22/2015) 30 g 0  . desmopressin (DDAVP) 0.2 MG tablet Take 0.4 mg by mouth at bedtime.     . fluticasone (FLONASE) 50 MCG/ACT nasal spray instill 2 sprays into each nostril once  daily (Patient not taking: Reported on 02/22/2015) 16 g 6  . glucose blood (ACCU-CHEK AVIVA) test strip Use as instructed 100 each 12  . Lancets 30G MISC 1 Device by Does not apply route daily before breakfast. 100 each 3  . lisinopril-hydrochlorothiazide (PRINZIDE,ZESTORETIC) 20-12.5 MG per tablet take 1 tablet by mouth once daily 90 tablet 2  . metFORMIN (GLUCOPHAGE-XR) 500 MG 24 hr tablet take 1 tablet by mouth once daily with BREAKFAST 30 tablet 2  . Misc. Devices MISC Please provide a pulse oximeter for the ear lobe. Patient has diagnosis of Raynaud, asthma 1 each 0  . montelukast (SINGULAIR) 10 MG tablet take 1 tablet by mouth at bedtime 90 tablet 3  . mupirocin ointment (BACTROBAN) 2 % APPLY TO AFECTED AREA 3 TIMES A DAY FOR 7 DAYS (Patient not taking: Reported on 02/22/2015) 22 g 0  . nystatin (MYCOSTATIN/NYSTOP) 100000 UNIT/GM POWD APPLY TO AREA WITHIN SKIN FOLDS TWO TIMES A DAY WHEN THERE IS REDNESS OR IRRITATION PRESENT (Patient not taking: Reported on 02/22/2015) 30 g 3  . nystatin-triamcinolone (MYCOLOG II) cream Apply 1 application topically 2 (two) times daily. (Patient not taking: Reported on 02/22/2015) 30 g 0  . oxybutynin (DITROPAN) 5 MG tablet Take 1 tablet (5 mg total) by mouth at bedtime. (Patient taking differently: Take 10 mg by mouth at bedtime. ) 30 tablet 3  . PROAIR HFA 108 (90 BASE) MCG/ACT inhaler inhale 2 puffs by mouth every  6 hours if needed 8.5 Inhaler 2  . QVAR 80 MCG/ACT inhaler inhale 4 puffs twice a day 26.1 g 12  . Respiratory Therapy Supplies (NEBULIZER/ADULT MASK) KIT 1 Device by Does not apply route once. 1 each 0  . Respiratory Therapy Supplies MISC BiPap, titration setting 20/15 1 each 0  . Spacer/Aero-Holding Chambers (AEROCHAMBER MV) inhaler Use with albuterol inhaler 1 each 1   No current facility-administered medications for this visit.   Chief complaint-noted No additions to family history Social history- patient is a non smoker  Objective: BP  144/94 mmHg  Pulse 99  Temp(Src) 98.2 F (36.8 C) (Oral)  Ht _0  (1.499 m)  Wt 351 lb 9 oz (159.468 kg)  BMI 70.97 kg/m2 Gen: NAD, alert, cooperative with exam, Morbidly Obese, but able to ambulate around the room without difficulty.  HEENT: NCAT, EOMI, PERRL Neck: FROM, supple CV: RRR, good S1/S2, no murmur Resp: CTABL, no wheezes, non-labored Abd: SNTND, BS present, no guarding or organomegaly Ext: No edema, warm, normal tone, moves UE/LE spontaneously Neuro: Alert and oriented, No gross deficits Skin: no rashes no lesions  Assessment/Plan:  # Hospital Follow Up  - Pt. Here for hospital follow up following accidental ingestion. Doing well. No issues.  - Will check BMET today.  - Otherwise, continue to recommend diet control.  - Paperwork for O2 nightly prescription filled out.  - Follow up as needed.

## 2015-03-31 ENCOUNTER — Telehealth: Payer: Self-pay | Admitting: Family Medicine

## 2015-03-31 NOTE — Telephone Encounter (Signed)
Judeth Cornfield from [facility] left message on medical records line that she is still needing office notes.

## 2015-03-31 NOTE — Telephone Encounter (Signed)
Spoke to stephanie and she needs last office note from 03-08-15 with Dr. Jaquita Rector. Jazmin Hartsell,CMA

## 2015-04-06 ENCOUNTER — Telehealth: Payer: Self-pay | Admitting: *Deleted

## 2015-04-06 NOTE — Telephone Encounter (Signed)
-----   Message from Yolande Jolly, MD sent at 04/05/2015  9:17 AM EDT ----- Regarding: Need O2 sats. I received a notice from [facility] regarding the need for documentation of his O2 sats for billing. We didn't have it in his most recent office visit with me. Somehow the O2 sat numbers got lost.   1. Could we call [facility] to see if THEY can document O2 sats and then fax them to me to sign?   2. If [facility] won't do this, can we call the patient to get them to come in for a nurse's visit, so that we can document O2 sats on him?   Thanks!  CGM MD

## 2015-04-06 NOTE — Telephone Encounter (Signed)
Spoke with stephanie at [facility] and mother has brought in all the necessary forms needed.  She just needs you to fax over the chronic diagnosis as to why patient is needing oxygen.  [name],CMA

## 2015-04-06 NOTE — Telephone Encounter (Signed)
RX filled out and placed on your desk. Thanks for your help with this.   CGM MD

## 2015-04-06 NOTE — Telephone Encounter (Signed)
script faxed to stephanie at [facility]

## 2015-04-21 ENCOUNTER — Other Ambulatory Visit: Payer: Self-pay | Admitting: Family Medicine

## 2015-04-24 ENCOUNTER — Other Ambulatory Visit: Payer: Self-pay | Admitting: Family Medicine

## 2015-04-24 NOTE — Telephone Encounter (Signed)
Mother called because her son needs refills on his Metformin, Flonase, and Singular. jw

## 2015-04-25 MED ORDER — FLUTICASONE PROPIONATE 50 MCG/ACT NA SUSP: NASAL | Status: DC

## 2015-04-25 MED ORDER — METFORMIN HCL ER 500 MG PO TB24: ORAL_TABLET | ORAL | Status: DC

## 2015-04-25 MED ORDER — MONTELUKAST SODIUM 10 MG PO TABS: 10.0000 mg | ORAL_TABLET | Freq: Every day | ORAL | Status: DC

## 2015-05-22 ENCOUNTER — Other Ambulatory Visit: Payer: Self-pay | Admitting: Family Medicine

## 2015-05-22 NOTE — Telephone Encounter (Signed)
Dr. Birdie SonsSonnenberg is in a different office now, please advise?

## 2015-06-14 ENCOUNTER — Other Ambulatory Visit: Payer: Self-pay | Admitting: Family Medicine

## 2015-06-14 NOTE — Telephone Encounter (Signed)
Are you still following this patient? It looks like he is being seen a 2 different office for primary care. Do you want to authorize refills?

## 2015-06-14 NOTE — Telephone Encounter (Signed)
Patient is no longer my patient. I previously saw them in residency. These refills should be refused and directed to his current PCP [facility]. Thanks.

## 2015-06-15 ENCOUNTER — Other Ambulatory Visit: Payer: Self-pay | Admitting: *Deleted

## 2015-06-16 MED ORDER — CETIRIZINE HCL 10 MG PO TABS: 10.0000 mg | ORAL_TABLET | Freq: Every day | ORAL | Status: DC

## 2015-06-16 MED ORDER — LISINOPRIL-HYDROCHLOROTHIAZIDE 20-12.5 MG PO TABS: 1.0000 | ORAL_TABLET | Freq: Every day | ORAL | Status: DC

## 2015-07-04 ENCOUNTER — Encounter: Payer: Self-pay | Admitting: Emergency Medicine

## 2015-07-04 ENCOUNTER — Ambulatory Visit (INDEPENDENT_AMBULATORY_CARE_PROVIDER_SITE_OTHER): Payer: Medicaid Other | Admitting: Emergency Medicine

## 2015-07-04 VITALS — BP 130/84 | HR 108 | Wt 360.0 lb

## 2015-07-04 DIAGNOSIS — E662 Morbid (severe) obesity with alveolar hypoventilation: Secondary | ICD-10-CM

## 2015-07-04 NOTE — Patient Instructions (Signed)
Please continue exercise routine and cardiopulmonary conditioning Please continue BiPAP every night using the current settings Follow with Dr Delton CoombesByrum in 6 months or sooner if you have any problems

## 2015-07-04 NOTE — Assessment & Plan Note (Signed)
Chronic hypoventilation and associated hypoxemic respiratory failure. Currently tolerates and is compliant with his BiPAP at 20/15. We will continue the same. He needs to continue to work on his conditioning and exercise. His caregiver understands this

## 2015-07-04 NOTE — Progress Notes (Signed)
Subjective:    Patient ID: [name], male    DOB: 05-Oct-1995, 19 y.o.   MRN: [id number]   Asthma He complains of shortness of breath and wheezing. There is no cough. Associated symptoms include postnasal drip. Pertinent negatives include no ear pain, fever, headaches, rhinorrhea, sneezing, sore throat or trouble swallowing. His past medical history is significant for asthma.   19 yo never smoker, Prader-Willi Syndrome c/b obesity, OSA/OHS using BiPAP 20/15 + 4L/min O2 every night, also w hx HTN, allergic rhinitis, kidney atrophy (on ddAVP, oxybut). Was dx w asthma as a child, has continued to have into teen years.  He has an occasional cough, wheeze with exertion. Takes zyrtec, Singulair for allergies - still w allergy sx and drainage right now. Newly on pulmicort 4 puff bid. Albuterol 3-4x a week. He walks with mom daily.   ROV 07/12/13 -- Prader-Willi Syndrome c/b obesity, OSA/OHS using BiPAP 20/15 + 4L/min.  Carries dx asthma, allergic rhinitis. His pulmicort has been changed to 4 puff QVAR 80 twice a day. He uses albuterol for dyspnea / SOB about 3-4x a week. Good compliance with BiPAP. No exacerbations since last time. Continues to exercise.   ROV 11/08/14 -- [facility] up today [facility] for sluggishness. Carries a hx of Prader-Willi Syndrome c/b obesity, OSA/OHS using BiPAP 20/15 + 4L/min. TTE during that hosp estimated PASP at 34mmHg. He had just gotten a new BiPAP mask in February that may have contributed to a decompensation. They present today to discuss his BiPAP and the options for treatment.,   ROV 12/14/14 -- regular follow-up visit for OSA/OHS on nocturnal BiPAP plus nocturnal oxygen. He has a history of Prader-Willi syndrome and obesity.  He is wearing his BiPAP reliably, [facility] helped adjust his mask. He is doing more during the day since we d/c'd his daytime oxygen.   ROV 07/04/15 -- follow-up visit for chronic respiratory failure in setting of OSA/OHS.  He is on BiPAP, good compliance, able to sleep through the night. He is able to exert, does water aerobics biw. [facility] with fewer breaks than before. No problems reported regarding his BiPAP per his caregiver today.                                        Review of Systems  Constitutional: Negative for fever and unexpected weight change.  HENT: Positive for congestion, postnasal drip and sinus pressure. Negative for dental problem, ear pain, nosebleeds, rhinorrhea, sneezing, sore throat and trouble swallowing.   Eyes: Negative for redness and itching.  Respiratory: Positive for shortness of breath and wheezing. Negative for cough and chest tightness.   Cardiovascular: Negative for palpitations and leg swelling.  Gastrointestinal: Negative for nausea and vomiting.  Genitourinary: Negative for dysuria.  Musculoskeletal: Negative for joint swelling.  Skin: Negative for rash.  Neurological: Negative for headaches.  Hematological: Does not bruise/bleed easily.  Psychiatric/Behavioral: Negative for dysphoric mood. The patient is not nervous/anxious.        Objective:   Physical Exam Filed Vitals:   07/04/15 1345  BP: 130/84  Pulse: 108  Weight: 360 lb (163.295 kg)  SpO2: 93%   Gen: Pleasant, morbidly obese, in no distress,  Quiet affect but does speak and answer questions.   ENT: No lesions,  mouth clear,  oropharynx clear, no active  postnasal drip  Neck: No JVD, no TMG, no carotid bruits  Lungs: No use of accessory muscles, very distant, clear without rales or rhonchi  Cardiovascular: RRR, heart sounds normal, no murmur or gallops, no peripheral edema  Musculoskeletal: No deformities, no cyanosis or clubbing  Neuro: alert, answers simple questions, moves all ext, faces consistent w his hx P-W syndrome.   Skin: Warm, no lesions or rashes     Assessment & Plan:  Obesity hypoventilation syndrome Chronic hypoventilation and associated hypoxemic  respiratory failure. Currently tolerates and is compliant with his BiPAP at 20/15. We will continue the same. He needs to continue to work on his conditioning and exercise. His caregiver understands this  OBESITY, MORBID Weight loss as above

## 2015-07-04 NOTE — Assessment & Plan Note (Signed)
Weight loss as above. 

## 2015-07-14 ENCOUNTER — Other Ambulatory Visit: Payer: Self-pay | Admitting: *Deleted

## 2015-07-18 MED ORDER — METFORMIN HCL ER 500 MG PO TB24: ORAL_TABLET | ORAL | Status: DC

## 2015-08-21 ENCOUNTER — Ambulatory Visit (INDEPENDENT_AMBULATORY_CARE_PROVIDER_SITE_OTHER): Payer: Medicaid Other | Admitting: Student

## 2015-08-21 VITALS — BP 155/91 | HR 105 | Temp 98.4°F | Wt 354.0 lb

## 2015-08-21 DIAGNOSIS — H109 Unspecified conjunctivitis: Secondary | ICD-10-CM

## 2015-08-21 MED ORDER — CARBOXYMETHYLCELLULOSE SODIUM 1 % OP SOLN: 1.0000 [drp] | Freq: Three times a day (TID) | OPHTHALMIC | Status: DC

## 2015-08-21 NOTE — Patient Instructions (Signed)
Follow up as needed Try eye drops for likely viral conjunctivitis If your eye gets worse, with worsening eye crusting, call the office to be seen Call with further questions or concerns, call the office at [phone]

## 2015-08-22 ENCOUNTER — Telehealth: Payer: Self-pay | Admitting: Family Medicine

## 2015-08-22 DIAGNOSIS — H109 Unspecified conjunctivitis: Secondary | ICD-10-CM | POA: Insufficient documentation

## 2015-08-22 MED ORDER — ERYTHROMYCIN 5 MG/GM OP OINT: 1.0000 "application " | TOPICAL_OINTMENT | Freq: Four times a day (QID) | OPHTHALMIC | Status: AC

## 2015-08-22 NOTE — Telephone Encounter (Signed)
Will forward to Dr. Haney to advise. Laquetta Racey,CMA  

## 2015-08-22 NOTE — Telephone Encounter (Signed)
Spoke with mother and she is aware that medication was sent to pharmacy. Myliah Medel,CMA

## 2015-08-22 NOTE — Progress Notes (Signed)
   Subjective:    Patient ID: [name], male    DOB: October 24, 1995, 20 y.o.   MRN: [id number]   CC: Concern for pink eye  HPI: 20 y/o with Prader Willi syndrome presents with mother for concern for pink eye  Left Pink Eye - Eye redness noted this afternoon by his caretaker after he awoke from a nap - he had crusting of that eye - he also reports eye itching - denies eye pain or trauma to the eye, or changes in vision -he does usually go to day care and Mom is unsure of he was exposed a that time - She other wise denies fever, cough, although he did have a cold a few weeks ago which has since resolved  Review of Systems ROS  Per HPI Past Medical, Surgical, Social, and Family History Reviewed & Updated per EMR.   Objective:  BP 155/91 mmHg  Pulse 105  Temp(Src) 98.4 F (36.9 C) (Oral)  Wt 354 lb (160.573 kg) Vitals and nursing note reviewed  General: NAD HEENT: watery left eye with erythema , no crusting or mucous visualized, normal EOM, right eye with normal conjuctiva non erythematous Cardiac: RRR,  Respiratory: CTAB, normal effort Neuro: alert and oriented, no focal deficits   Assessment & Plan:    Conjunctivitis Eye erythema with no reported trauma or eye pain. Likley conjunctivitis of bacterial versus viral origin .Lack of thick eye discharge makes bacterial conjunctivitis less likley - Will treat with lubricating eye drops to help reduce irritation - Wil follow clinically for imporvement     Melat Wrisley A. Kennon Rounds MD, MS Family Medicine Resident PGY-2 Pager [phone]

## 2015-08-22 NOTE — Telephone Encounter (Signed)
Patient was seen by Dr. Kennon Rounds yesterday. Mother calls, stating that pt eye was worse this morning and is requesting antibiotics for patient. Please advise.

## 2015-08-22 NOTE — Assessment & Plan Note (Signed)
Eye erythema with no reported trauma or eye pain. Likley conjunctivitis of bacterial versus viral origin .Lack of thick eye discharge makes bacterial conjunctivitis less likley - Will treat with lubricating eye drops to help reduce irritation - Wil follow clinically for imporvement

## 2015-08-22 NOTE — Telephone Encounter (Signed)
Erythromycin eye ointment called to pharmacy for concern of worsening conjunctivitis

## 2015-08-28 ENCOUNTER — Telehealth: Payer: Self-pay | Admitting: Family Medicine

## 2015-08-28 NOTE — Telephone Encounter (Signed)
Will forward to Dr. Kennon Rounds who saw patient to see if there is anything else patient can be doing at home. Jazmin Hartsell,CMA

## 2015-08-28 NOTE — Telephone Encounter (Signed)
Will forward 2nd message to Dr. Kennon Rounds for additional advice for patient and mom regarding pink eye.

## 2015-08-28 NOTE — Telephone Encounter (Signed)
Pt's eye is still pink and all the ointment has been used. It is still a little crusty in the morning. Please advise

## 2015-08-28 NOTE — Telephone Encounter (Signed)
Mother called again about message about pink eye.  Please call with advice

## 2015-08-29 ENCOUNTER — Telehealth: Payer: Self-pay | Admitting: Student

## 2015-08-29 NOTE — Telephone Encounter (Signed)
Pt called regarding concern of conjunctivitis. She did not answer her phone but a message was left advising her that eye redness from conjunctivitis can persist for several weeks. She was advised to call the office to make an appointment if she feels he is getting worse  Licia Harl A. Kennon Rounds MD, MS Family Medicine Resident PGY-2 Pager [phone]

## 2015-09-08 ENCOUNTER — Other Ambulatory Visit: Payer: Self-pay | Admitting: Family Medicine

## 2015-09-12 IMAGING — CR DG CHEST 2V
2 series · 2 of 2 positions shown · non-contrast
Comparison: September 15, 2014.

CLINICAL DATA: Dyspnea.

EXAM:
CHEST  2 VIEW

[w chest pa]
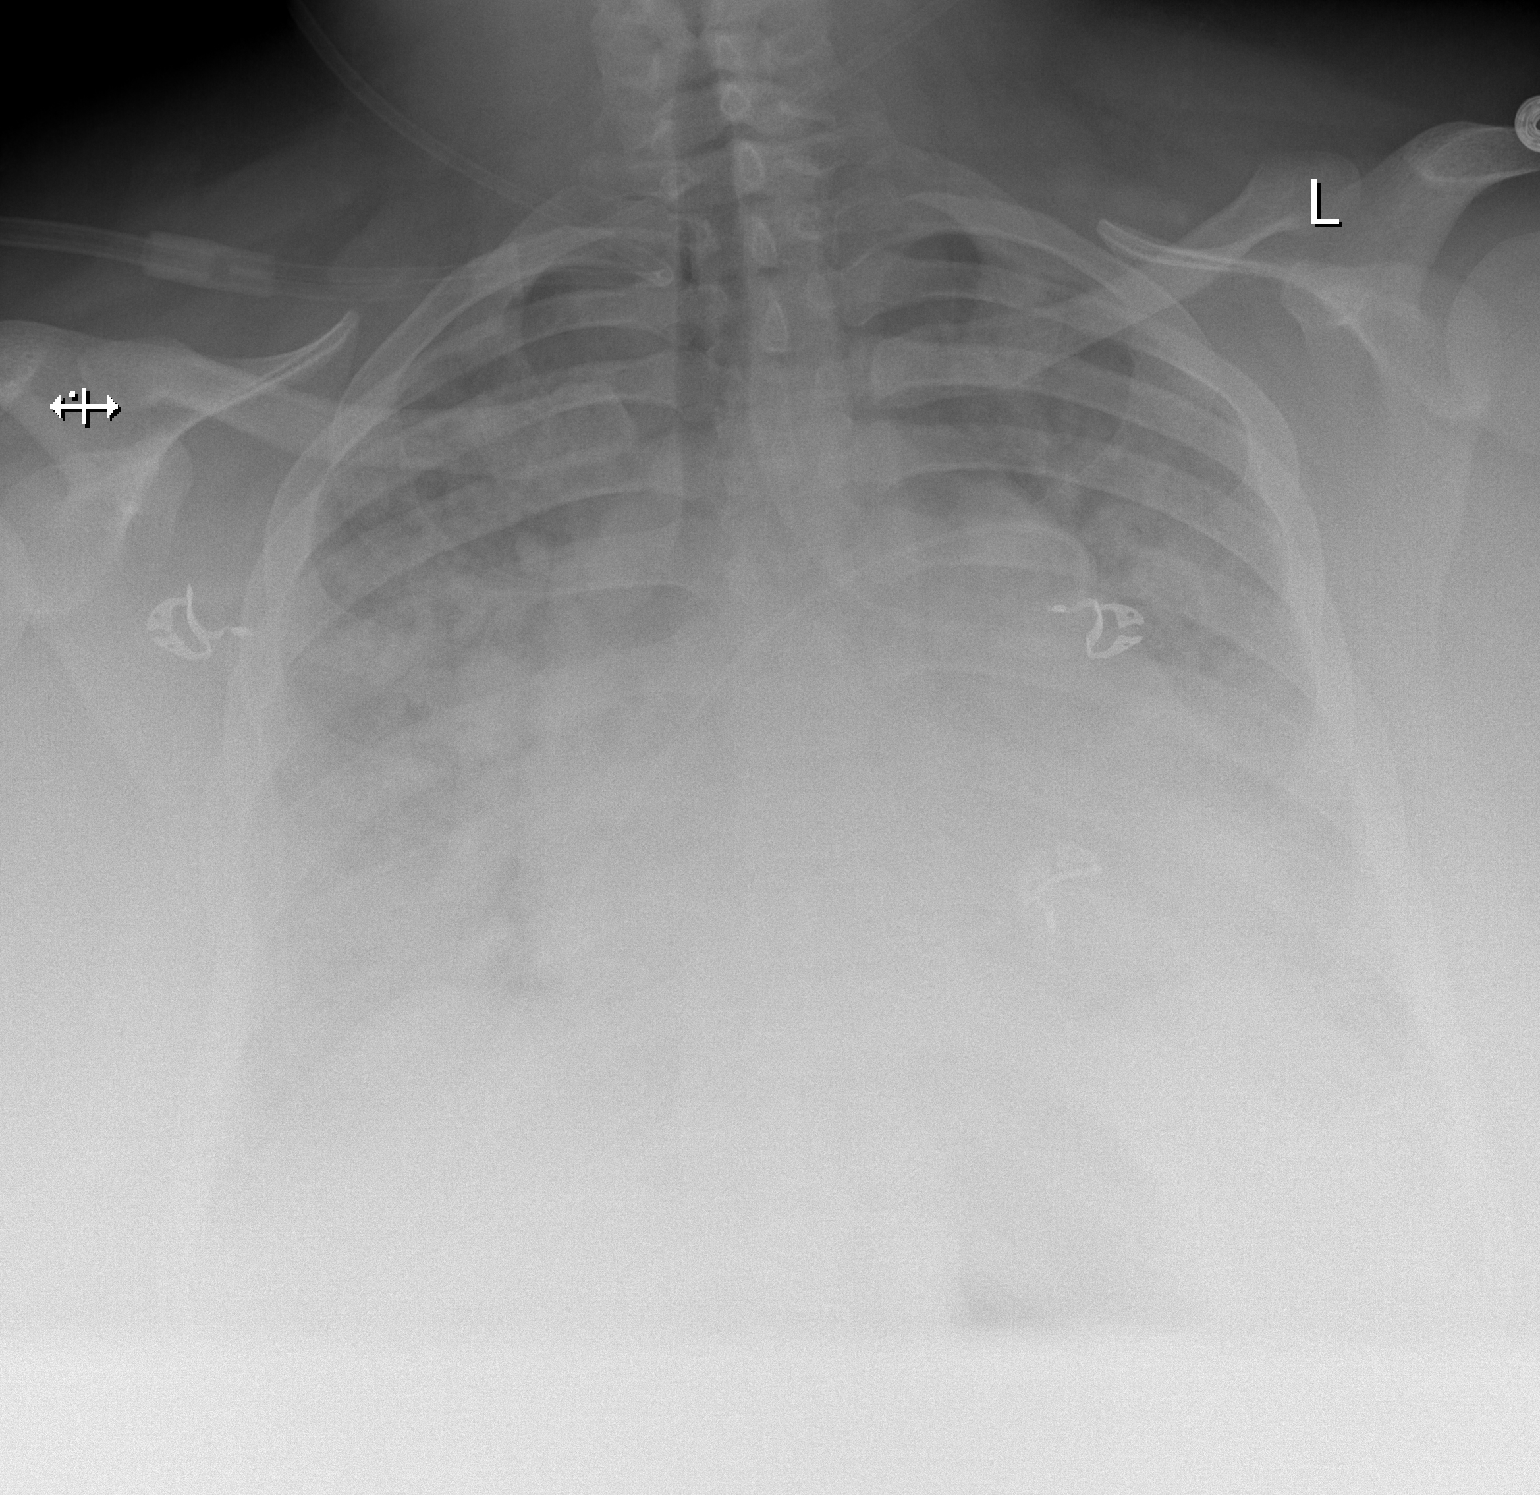

[w chest lat]
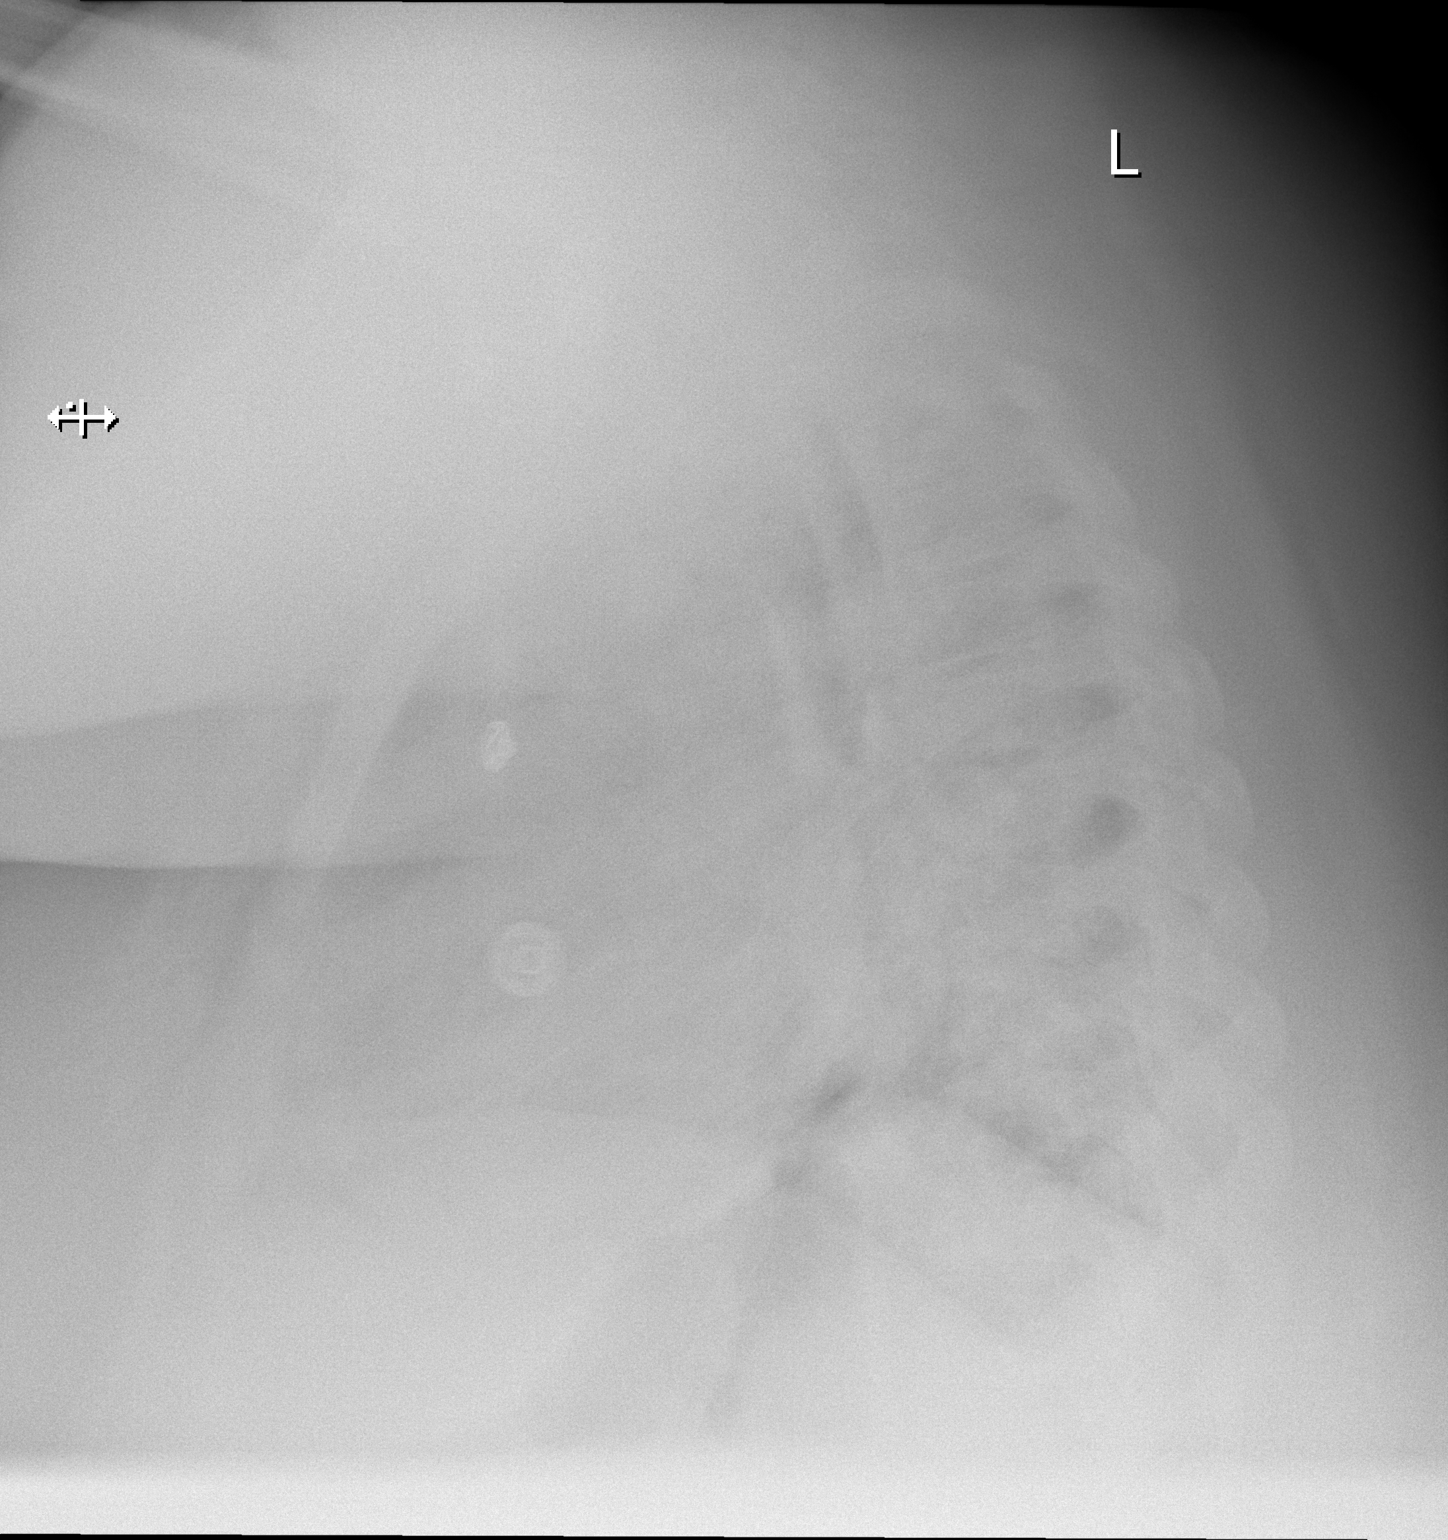

[2 of 2 positions shown; findings below may reference images not displayed]

FINDINGS: Stable cardiomegaly. No pneumothorax is noted. Diffuse airspace
opacities are noted throughout both lungs most consistent with
pneumonia or possibly edema. No significant pleural effusion is
noted. Bony thorax is intact.
IMPRESSION: Stable cardiomegaly and diffuse bilateral airspace opacities
consistent with pneumonia or edema.

## 2015-09-14 ENCOUNTER — Telehealth: Payer: Self-pay | Admitting: Family Medicine

## 2015-09-14 NOTE — Telephone Encounter (Signed)
Patient's Mother asks PCP to complete [facility] Form. Please, follow up.

## 2015-09-14 NOTE — Telephone Encounter (Signed)
Clinic portion completed and placed in providers box. Seaborn Nakama,CMA  

## 2015-09-19 NOTE — Telephone Encounter (Signed)
Form filled out. Thanks  CGM MD

## 2015-09-19 NOTE — Telephone Encounter (Signed)
Patient's mother informed that form is complete and ready for pickup.  Allin Frix L, RN  

## 2015-10-05 ENCOUNTER — Other Ambulatory Visit: Payer: Self-pay | Admitting: Family Medicine

## 2015-10-06 ENCOUNTER — Other Ambulatory Visit: Payer: Self-pay | Admitting: *Deleted

## 2015-10-06 MED ORDER — METFORMIN HCL ER 500 MG PO TB24: ORAL_TABLET | ORAL | Status: DC

## 2015-11-02 ENCOUNTER — Other Ambulatory Visit: Payer: Self-pay | Admitting: Family Medicine

## 2015-11-02 MED ORDER — ACCU-CHEK SOFTCLIX LANCETS MISC: Status: DC

## 2015-11-02 NOTE — Telephone Encounter (Signed)
Mother called because her son needs a refill on his ACCU-CHEK SOFTCLIX LANCETS. The problem is that this was sent to his previous PCP and was denied. Can we get this called in. jw

## 2015-11-03 ENCOUNTER — Other Ambulatory Visit: Payer: Self-pay | Admitting: *Deleted

## 2015-11-06 MED ORDER — CETIRIZINE HCL 10 MG PO TABS: 10.0000 mg | ORAL_TABLET | Freq: Every day | ORAL | Status: DC

## 2015-11-06 MED ORDER — FLUTICASONE PROPIONATE 50 MCG/ACT NA SUSP: NASAL | Status: DC

## 2015-12-26 ENCOUNTER — Other Ambulatory Visit: Payer: Self-pay | Admitting: *Deleted

## 2015-12-27 MED ORDER — METFORMIN HCL ER 500 MG PO TB24: ORAL_TABLET | ORAL | Status: DC

## 2016-01-08 ENCOUNTER — Emergency Department (HOSPITAL_BASED_OUTPATIENT_CLINIC_OR_DEPARTMENT_OTHER)
Admission: EM | Admit: 2016-01-08 | Discharge: 2016-01-08 | Disposition: A | Discharge status: Home / Self Care | Payer: Medicaid Other | Attending: Emergency Medicine | Admitting: Emergency Medicine

## 2016-01-08 ENCOUNTER — Encounter (HOSPITAL_BASED_OUTPATIENT_CLINIC_OR_DEPARTMENT_OTHER): Payer: Self-pay | Admitting: *Deleted

## 2016-01-08 DIAGNOSIS — Y999 Unspecified external cause status: Secondary | ICD-10-CM | POA: Insufficient documentation

## 2016-01-08 DIAGNOSIS — S0101XA Laceration without foreign body of scalp, initial encounter: Secondary | ICD-10-CM | POA: Insufficient documentation

## 2016-01-08 DIAGNOSIS — Y929 Unspecified place or not applicable: Secondary | ICD-10-CM | POA: Insufficient documentation

## 2016-01-08 DIAGNOSIS — Y939 Activity, unspecified: Secondary | ICD-10-CM | POA: Insufficient documentation

## 2016-01-08 DIAGNOSIS — W228XXA Striking against or struck by other objects, initial encounter: Secondary | ICD-10-CM | POA: Insufficient documentation

## 2016-01-08 DIAGNOSIS — E662 Morbid (severe) obesity with alveolar hypoventilation: Secondary | ICD-10-CM | POA: Diagnosis not present

## 2016-01-08 DIAGNOSIS — J45909 Unspecified asthma, uncomplicated: Secondary | ICD-10-CM | POA: Insufficient documentation

## 2016-01-08 DIAGNOSIS — Z23 Encounter for immunization: Secondary | ICD-10-CM | POA: Insufficient documentation

## 2016-01-08 DIAGNOSIS — Z6841 Body Mass Index (BMI) 40.0 and over, adult: Secondary | ICD-10-CM | POA: Diagnosis not present

## 2016-01-08 MED ORDER — BACITRACIN ZINC 500 UNIT/GM EX OINT: 1.0000 "application " | TOPICAL_OINTMENT | Freq: Two times a day (BID) | CUTANEOUS | Status: DC

## 2016-01-08 MED ORDER — TETANUS-DIPHTH-ACELL PERTUSSIS 5-2.5-18.5 LF-MCG/0.5 IM SUSP
0.5000 mL | Freq: Once | INTRAMUSCULAR | Status: AC
  Administered 2016-01-08: 0.5 mL via INTRAMUSCULAR
  Filled 2016-01-08: qty 0.5

## 2016-01-08 NOTE — ED Notes (Signed)
He bent over and hit the top of his head of the door frame. Laceration to the top of his head. Bleeding controlled. Bleeding controlled.

## 2016-01-08 NOTE — ED Provider Notes (Signed)
CSN: [id number]     Arrival date & time 01/08/16  1108 History   First MD Initiated Contact with Patient 01/08/16 1142     Chief Complaint  Patient presents with  . Head Laceration    (Consider location/radiation/quality/duration/timing/severity/associated sxs/prior Treatment) Patient is a 20 y.o. male presenting with scalp laceration. The history is provided by the patient, a parent and medical records. No language interpreter was used.  Head Laceration  AMAAR OSHITA is a 20 y.o. male  with a PMH of prader-willi syndrome, asthma who presents to the Emergency Department with mother for a laceration on top of head which occurred just PTA. Patient leaned over to grab a shoe and when he came back up, he hit his head on the doorframe causing lac. Bleeding controlled and site cleaned at home. No meds taken PTA. No alleviating or aggravating factors noted. Unknown tetanus status. No LOC, n/v, or any other associated symptoms.   Past Medical History  Diagnosis Date  . Asthma   . Prader-Willi syndrome   . Obesity   . Obesity hypoventilation syndrome ([place])   . Sleep apnea   . Raynaud's phenomenon   . Kidney dysfunction     left   Past Surgical History  Procedure Laterality Date  . Nissen fundoplication    . Tonsillectomy     No family history on file. Social History  Substance Use Topics  . Smoking status: Never Smoker   . Smokeless tobacco: Never Used  . Alcohol Use: No    Review of Systems  Skin: Positive for wound.  Neurological: Negative for syncope.      Allergies  Review of patient's allergies indicates no known allergies.  Home Medications   Prior to Admission medications   Medication Sig Start Date End Date Taking? Authorizing Provider  ACCU-CHEK AVIVA PLUS test strip as directed 11/02/15   Leone Haven, MD  ACCU-CHEK SOFTCLIX LANCETS lancets Use as instructed 11/02/15   Aquilla Hacker, MD  albuterol (PROVENTIL) (2.5 MG/3ML) 0.083% nebulizer solution inhale  contents of 1 vial in nebulizer once daily 05/22/15   Aquilla Hacker, MD  bacitracin ointment Apply 1 application topically 2 (two) times daily. 01/08/16   Ozella Almond Ward, PA-C  carboxymethylcellulose 1 % ophthalmic solution Apply 1 drop to eye 3 (three) times daily. 08/21/15   Veatrice Bourbon, MD  cetirizine (ZYRTEC) 10 MG tablet Take 1 tablet (10 mg total) by mouth daily. 11/06/15   Aquilla Hacker, MD  desmopressin (DDAVP) 0.2 MG tablet Take 0.4 mg by mouth at bedtime.     Historical Provider, MD  fluticasone Asencion Islam) 50 MCG/ACT nasal spray instill 2 sprays into each nostril once daily 11/06/15   Aquilla Hacker, MD  Lancets 30G MISC 1 Device by Does not apply route daily before breakfast. 10/06/14   Leone Haven, MD  lisinopril-hydrochlorothiazide (PRINZIDE,ZESTORETIC) 20-12.5 MG tablet Take 1 tablet by mouth daily. 06/16/15   Aquilla Hacker, MD  metFORMIN (GLUCOPHAGE-XR) 500 MG 24 hr tablet take 1 tablet by mouth once daily with BREAKFAST 12/27/15   Aquilla Hacker, MD  Misc. Devices MISC Please provide a pulse oximeter for the ear lobe. Patient has diagnosis of Raynaud, asthma 09/16/11   Judithann Sheen, MD  montelukast (SINGULAIR) 10 MG tablet Take 1 tablet (10 mg total) by mouth at bedtime. 04/25/15   Aquilla Hacker, MD  oxybutynin (DITROPAN) 5 MG tablet Take 1 tablet (5 mg total) by mouth at bedtime. Patient taking  differently: Take 10 mg by mouth at bedtime.  04/20/12   Carolin Guernsey, MD  PROAIR HFA 108 [phone] BASE) MCG/ACT inhaler inhale 2 puffs by mouth every 6 hours if needed 02/17/15   Collene Gobble, MD  QVAR 80 MCG/ACT inhaler inhale 4 puffs twice a day 09/08/15   Aquilla Hacker, MD  Respiratory Therapy Supplies (NEBULIZER/ADULT MASK) KIT 1 Device by Does not apply route once. 05/20/14   Leone Haven, MD  Respiratory Therapy Supplies MISC BiPap, titration setting 20/15 08/05/12   Carolin Guernsey, MD  Spacer/Aero-Holding Chambers (AEROCHAMBER MV) inhaler Use with albuterol  inhaler 04/20/12   Merlene Laughter Park, MD   BP 139/86 mmHg  Pulse 114  Temp(Src) 98.7 F (37.1 C) (Oral)  Resp 22  Ht _0  (1.473 m)  Wt 161.566 kg  BMI 74.46 kg/m2  SpO2 97% Physical Exam  Constitutional: He is oriented to person, place, and time. He appears well-developed and well-nourished.  Alert and in no acute distress  HENT:  Head: Normocephalic.  2cm lac to the top of head   Neck: Normal range of motion.  Cardiovascular: Normal rate, regular rhythm and normal heart sounds.   Pulmonary/Chest: Effort normal and breath sounds normal. No respiratory distress.  Musculoskeletal: Normal range of motion.  Neurological: He is alert and oriented to person, place, and time.  Skin: Skin is warm and dry. No erythema.  Nursing note and vitals reviewed.   ED Course  Procedures (including critical care time)  LACERATION REPAIR Performed by: Ozella Almond Ward Authorized by: Ozella Almond Ward Consent: Verbal consent obtained. Risks and benefits: risks, benefits and alternatives were discussed Consent given by: patient Patient identity confirmed: provided demographic data Prepped and Draped in normal sterile fashion Wound explored Laceration Location: Scalp Laceration Length: 2 cm No Foreign Bodies seen or palpated Anesthesia: none Irrigation method: syringe Amount of cleaning: standard Skin closure: 3 staples  Patient tolerance: Patient tolerated the procedure well with no immediate complications. Labs Review Labs Reviewed - No data to display  Imaging Review No results found. I have personally reviewed and evaluated these images and lab results as part of my medical decision-making.   EKG Interpretation None      MDM   Final diagnoses:  Scalp laceration, initial encounter   NIKODEM LEADBETTER presents with mother for scalp laceration. Repaired with staples as dictated above. Tetanus updated. Follow up in 7 days for removal. Home care instructions were given and  return precautions discussed. All questions answered.    [facility] Ward, PA-C 01/08/16 1231  Dorie Rank, MD 01/08/16 [phone]

## 2016-01-08 NOTE — Discharge Instructions (Signed)
Keep the laceration site dry for the next 24 hours and leave the dressing in place. After 24 hours you may remove the dressing and gently clean the laceration site with antibacterial soap and warm water. Do not scrub the area. Do not soak the area and water for long periods of time. Apply topical bacitracin 1-2 times per day for the next 7 days. Return to the emergency department in 7 days for removal of the staples.  Scalp laceration generally heal very well with minimal risk of infection, however, you should return sooner for any signs of infection which would include increased redness around the wound, increased swelling, new drainage of yellow pus. ° °

## 2016-01-15 ENCOUNTER — Encounter (HOSPITAL_BASED_OUTPATIENT_CLINIC_OR_DEPARTMENT_OTHER): Payer: Self-pay | Admitting: *Deleted

## 2016-01-15 ENCOUNTER — Emergency Department (HOSPITAL_BASED_OUTPATIENT_CLINIC_OR_DEPARTMENT_OTHER)
Admission: EM | Admit: 2016-01-15 | Discharge: 2016-01-15 | Disposition: A | Discharge status: Home / Self Care | Payer: Medicaid Other | Attending: Emergency Medicine | Admitting: Emergency Medicine

## 2016-01-15 DIAGNOSIS — Z7984 Long term (current) use of oral hypoglycemic drugs: Secondary | ICD-10-CM | POA: Insufficient documentation

## 2016-01-15 DIAGNOSIS — E119 Type 2 diabetes mellitus without complications: Secondary | ICD-10-CM | POA: Insufficient documentation

## 2016-01-15 DIAGNOSIS — E662 Morbid (severe) obesity with alveolar hypoventilation: Secondary | ICD-10-CM | POA: Insufficient documentation

## 2016-01-15 DIAGNOSIS — Z4802 Encounter for removal of sutures: Secondary | ICD-10-CM

## 2016-01-15 DIAGNOSIS — Z79899 Other long term (current) drug therapy: Secondary | ICD-10-CM | POA: Insufficient documentation

## 2016-01-15 DIAGNOSIS — J45909 Unspecified asthma, uncomplicated: Secondary | ICD-10-CM | POA: Diagnosis not present

## 2016-01-15 HISTORY — DX: Type 2 diabetes mellitus without complications: E11.9

## 2016-01-15 NOTE — ED Provider Notes (Signed)
CSN: [id number]     Arrival date & time 01/15/16  1035 History   First MD Initiated Contact with Patient 01/15/16 1048     No chief complaint on file.    (Consider location/radiation/quality/duration/timing/severity/associated sxs/prior Treatment) HPI  20 year old male with a history of Prader-Willi syndrome presents for staple removal. Mom states that patient has been doing well since staples were placed one week ago. No bleeding, redness, swelling, drainage, or fevers. Is acting at his baseline.  Past Medical History  Diagnosis Date  . Asthma   . Prader-Willi syndrome   . Obesity   . Obesity hypoventilation syndrome ([place])   . Sleep apnea   . Raynaud's phenomenon   . Kidney dysfunction     left  . Diabetes mellitus without complication [facility])    Past Surgical History  Procedure Laterality Date  . Nissen fundoplication    . Tonsillectomy     No family history on file. Social History  Substance Use Topics  . Smoking status: Never Smoker   . Smokeless tobacco: Never Used  . Alcohol Use: No    Review of Systems  Unable to perform ROS: Other      Allergies  Review of patient's allergies indicates no known allergies.  Home Medications   Prior to Admission medications   Medication Sig Start Date End Date Taking? Authorizing Provider  ACCU-CHEK AVIVA PLUS test strip as directed 11/02/15  Yes Leone Haven, MD  ACCU-CHEK SOFTCLIX LANCETS lancets Use as instructed 11/02/15  Yes Aquilla Hacker, MD  albuterol (PROVENTIL) (2.5 MG/3ML) 0.083% nebulizer solution inhale contents of 1 vial in nebulizer once daily 05/22/15  Yes York Ram Melancon, MD  bacitracin ointment Apply 1 application topically 2 (two) times daily. 01/08/16  Yes Jaime Pilcher Ward, PA-C  carboxymethylcellulose 1 % ophthalmic solution Apply 1 drop to eye 3 (three) times daily. 08/21/15  Yes Alyssa A Lincoln Brigham, MD  cetirizine (ZYRTEC) 10 MG tablet Take 1 tablet (10 mg total) by mouth daily. 11/06/15  Yes Aquilla Hacker, MD  desmopressin (DDAVP) 0.2 MG tablet Take 0.4 mg by mouth at bedtime.    Yes Historical Provider, MD  fluticasone (FLONASE) 50 MCG/ACT nasal spray instill 2 sprays into each nostril once daily 11/06/15  Yes Aquilla Hacker, MD  Lancets 30G MISC 1 Device by Does not apply route daily before breakfast. 10/06/14  Yes Leone Haven, MD  lisinopril-hydrochlorothiazide (PRINZIDE,ZESTORETIC) 20-12.5 MG tablet Take 1 tablet by mouth daily. 06/16/15  Yes Aquilla Hacker, MD  metFORMIN (GLUCOPHAGE-XR) 500 MG 24 hr tablet take 1 tablet by mouth once daily with BREAKFAST 12/27/15  Yes Aquilla Hacker, MD  Misc. Devices MISC Please provide a pulse oximeter for the ear lobe. Patient has diagnosis of Raynaud, asthma 09/16/11  Yes Judithann Sheen, MD  montelukast (SINGULAIR) 10 MG tablet Take 1 tablet (10 mg total) by mouth at bedtime. 04/25/15  Yes Aquilla Hacker, MD  oxybutynin (DITROPAN) 5 MG tablet Take 1 tablet (5 mg total) by mouth at bedtime. Patient taking differently: Take 10 mg by mouth at bedtime.  04/20/12  Yes Carolin Guernsey, MD  PROAIR HFA 108 [phone] BASE) MCG/ACT inhaler inhale 2 puffs by mouth every 6 hours if needed 02/17/15  Yes Collene Gobble, MD  QVAR 80 MCG/ACT inhaler inhale 4 puffs twice a day 09/08/15  Yes Aquilla Hacker, MD  Respiratory Therapy Supplies (NEBULIZER/ADULT MASK) KIT 1 Device by Does not apply route once. 05/20/14  Yes Leone Haven, MD  Respiratory Therapy Supplies MISC BiPap, titration setting 20/15 08/05/12  Yes Carolin Guernsey, MD  Spacer/Aero-Holding Chambers (AEROCHAMBER MV) inhaler Use with albuterol inhaler 04/20/12  Yes Merlene Laughter Park, MD   BP 128/92 mmHg  Pulse 100  Temp(Src) 98.1 F (36.7 C) (Oral)  Resp 22  Ht '4\' 10"'  (1.473 m)  Wt 356 lb (161.481 kg)  BMI 74.42 kg/m2  SpO2 100% Physical Exam  Constitutional: He appears well-developed and well-nourished.  Obese, resting in chair comfortably with no distress  HENT:  Head: Normocephalic.     Right Ear: External ear normal.  Left Ear: External ear normal.  Nose: Nose normal.  Eyes: Right eye exhibits no discharge. Left eye exhibits no discharge.  Neck: Neck supple.  Pulmonary/Chest: Effort normal.  Abdominal: He exhibits no distension.  Neurological: He is alert.  Skin: Skin is warm and dry. He is not diaphoretic. No erythema.  Nursing note and vitals reviewed.   ED Course  .Suture Removal Date/Time: 01/15/2016 11:00 AM Performed by: Sherwood Gambler Authorized by: Sherwood Gambler Consent: Verbal consent obtained. Risks and benefits: risks, benefits and alternatives were discussed Consent given by: parent Body area: head/neck Location details: scalp Wound Appearance: clean Staples Removed: 3 Facility: sutures placed in this facility Patient tolerance: Patient tolerated the procedure well with no immediate complications   (including critical care time) Labs Review Labs Reviewed - No data to display  Imaging Review No results found. I have personally reviewed and evaluated these images and lab results as part of my medical decision-making.   EKG Interpretation None      MDM   Final diagnoses:  Encounter for staple removal    Patient is well-appearing with no signs of wound infection. Staples removed without difficulty. Discussed general wound care precautions and follow-up with PCP as needed.    Sherwood Gambler, MD 01/15/16 1100

## 2016-01-15 NOTE — ED Notes (Signed)
Staples were put in head on Monday last week. Wound well healed here for staple removal today.

## 2016-02-27 ENCOUNTER — Telehealth: Payer: Self-pay | Admitting: Student

## 2016-02-27 NOTE — Telephone Encounter (Signed)
Left message on mothers phone to call back and make an apt in regards to her sons O2 use.

## 2016-02-27 NOTE — Telephone Encounter (Signed)
Patient has an appt on 03/06/16 with Dr. Deirdre Priest. [name],CMA

## 2016-02-27 NOTE — Telephone Encounter (Signed)
Please call to make an appointment regarding oxygen use

## 2016-03-06 ENCOUNTER — Ambulatory Visit (INDEPENDENT_AMBULATORY_CARE_PROVIDER_SITE_OTHER): Payer: Medicaid Other | Admitting: Family Medicine

## 2016-03-06 ENCOUNTER — Encounter: Payer: Self-pay | Admitting: Family Medicine

## 2016-03-06 VITALS — BP 149/85 | HR 121 | Temp 98.5°F | Ht <= 58 in | Wt 359.0 lb

## 2016-03-06 DIAGNOSIS — E662 Morbid (severe) obesity with alveolar hypoventilation: Secondary | ICD-10-CM

## 2016-03-06 DIAGNOSIS — E669 Obesity, unspecified: Secondary | ICD-10-CM

## 2016-03-06 DIAGNOSIS — E119 Type 2 diabetes mellitus without complications: Secondary | ICD-10-CM | POA: Diagnosis not present

## 2016-03-06 DIAGNOSIS — I10 Essential (primary) hypertension: Secondary | ICD-10-CM | POA: Diagnosis not present

## 2016-03-06 DIAGNOSIS — E1169 Type 2 diabetes mellitus with other specified complication: Secondary | ICD-10-CM

## 2016-03-06 LAB — COMPREHENSIVE METABOLIC PANEL
ALBUMIN: 3.7 g/dL (ref 3.6–5.1)
ALT: 20 U/L (ref 9–46)
AST: 21 U/L (ref 10–40)
Alkaline Phosphatase: 142 U/L — ABNORMAL HIGH (ref 40–115)
BILIRUBIN TOTAL: 0.4 mg/dL (ref 0.2–1.2)
BUN: 22 mg/dL (ref 7–25)
CHLORIDE: 103 mmol/L (ref 98–110)
CO2: 27 mmol/L (ref 20–31)
CREATININE: 1 mg/dL (ref 0.60–1.35)
Calcium: 9.3 mg/dL (ref 8.6–10.3)
Glucose, Bld: 122 mg/dL — ABNORMAL HIGH (ref 65–99)
Potassium: 4.7 mmol/L (ref 3.5–5.3)
Sodium: 140 mmol/L (ref 135–146)
TOTAL PROTEIN: 8.1 g/dL (ref 6.1–8.1)

## 2016-03-06 LAB — POCT GLYCOSYLATED HEMOGLOBIN (HGB A1C): Hemoglobin A1C: 6.4

## 2016-03-06 NOTE — Patient Instructions (Signed)
Good to see you today!  Thanks for coming in.  I will chek for the paperwork and fill out for recert O2  I will call you if your tests are not good.  Otherwise I will send you a letter.  If you do not hear from me with in 2 weeks please call our office.     See his pulmonologist in November  Follow up here in 3 months  Keep walking and eating right - aim to lose about 1 lb week

## 2016-03-07 NOTE — Assessment & Plan Note (Signed)
Stable.  Encouraged trying to restart weight loss.

## 2016-03-07 NOTE — Progress Notes (Signed)
Subjective  Patient is presenting with the following illnesses  Obesity Hypoventilation Here for documentaton of O2 need.  He and guardian feel he is doing fairly well.  Uses O2 at night.  No recent acute problems.  Was losing weight but has stopped recently.  They are interested in trying again.  No chest pain or new edema.  Sees his pulmonologist once a year  DIABETES Disease Monitoring: Blood Sugar ranges(Severity) -not checking  Associated Symptoms- Polyuria/phagia/dipsia- none new      Visual problems- no Medications: Compliance(Modifying factor) - ,metformini daily Hypoglycemic symptoms- no Timing - continuous   Chief Complaint noted Review of Symptoms - see HPI PMH - Smoking status noted.     Objective Vital Signs reviewed Alert obese interactive Heart - very distant but Regular rate and rhythm.  No murmurs, gallops or rubs.    Lungs:  Normal respiratory effort, chest expands symmetrically. Lungs are clear to auscultation, no crackles or wheezes.  88% on RA still 72% on ambulation 99% after sitting 5 min  Assessments/Plans  No problem-specific Assessment & Plan notes found for this encounter.   See Encounter view if individual problem A/Ps not visible See after visit summary for details of patient instuctions

## 2016-03-07 NOTE — Assessment & Plan Note (Signed)
Lab Results  Component Value Date   HGBA1C 6.4 03/06/2016   Stable on metformin

## 2016-03-20 ENCOUNTER — Other Ambulatory Visit: Payer: Self-pay | Admitting: *Deleted

## 2016-03-20 MED ORDER — CETIRIZINE HCL 10 MG PO TABS: 10.0000 mg | ORAL_TABLET | Freq: Every day | ORAL | 4 refills | Status: DC

## 2016-03-20 MED ORDER — METFORMIN HCL ER 500 MG PO TB24: ORAL_TABLET | ORAL | 2 refills | Status: DC

## 2016-03-22 ENCOUNTER — Other Ambulatory Visit: Payer: Self-pay | Admitting: Student

## 2016-05-09 ENCOUNTER — Other Ambulatory Visit: Payer: Self-pay | Admitting: Emergency Medicine

## 2016-05-14 ENCOUNTER — Other Ambulatory Visit: Payer: Self-pay | Admitting: *Deleted

## 2016-05-14 MED ORDER — FLUTICASONE PROPIONATE 50 MCG/ACT NA SUSP: NASAL | 6 refills | Status: DC

## 2016-06-14 ENCOUNTER — Other Ambulatory Visit: Payer: Self-pay | Admitting: Student

## 2016-08-12 ENCOUNTER — Other Ambulatory Visit: Payer: Self-pay | Admitting: Student

## 2016-08-14 ENCOUNTER — Telehealth: Payer: Self-pay | Admitting: Student

## 2016-08-14 NOTE — Telephone Encounter (Signed)
Medication to be given by staff for patient form dropped off for at front desk for completion.  Verified that patient section of form has been completed.  Last DOS with  was 03/06/16.  Placed form in blue team folder to be completed by clinical staff.  [name]

## 2016-08-14 NOTE — Telephone Encounter (Signed)
Clinic portion filled out, placed in providers box for review.  

## 2016-08-15 NOTE — Telephone Encounter (Signed)
Patient's mom informed that medication form is complete and ready for pickup.  Clovis PuMartin, Tamika L, RN

## 2016-08-23 ENCOUNTER — Telehealth: Payer: Self-pay | Admitting: Student

## 2016-08-23 NOTE — Telephone Encounter (Signed)
[facility] called and would like to have notes from 03/06/16 and any visits before after that showing that the patient is on oxygen. Please fax these to [phone] attention Courtney. jw

## 2016-08-23 NOTE — Telephone Encounter (Signed)
Notes printed from 03-08-15 and 03-06-16 and faxed to number provided. Abb Gobert,CMA

## 2016-08-29 ENCOUNTER — Telehealth: Payer: Self-pay | Admitting: Student

## 2016-08-29 MED ORDER — OSELTAMIVIR PHOSPHATE 75 MG PO CAPS: 75.0000 mg | ORAL_CAPSULE | Freq: Two times a day (BID) | ORAL | 0 refills | Status: DC

## 2016-08-29 NOTE — Telephone Encounter (Signed)
Will forward to MD to advise. Jazmin Hartsell,CMA  

## 2016-08-29 NOTE — Telephone Encounter (Signed)
Brother tested positive for the flu, brother's pediatrician said to have a Tamiflu called in for pt especially because pt has asthma. [facility] [address]. ep

## 2016-08-29 NOTE — Telephone Encounter (Signed)
Patient's mother called to inform him that the tamiflu will be sent to his pharmacy. She expressed her understanding and will pick I up

## 2016-09-07 ENCOUNTER — Other Ambulatory Visit: Payer: Self-pay | Admitting: Student

## 2016-09-09 ENCOUNTER — Other Ambulatory Visit: Payer: Self-pay | Admitting: *Deleted

## 2016-09-09 MED ORDER — BECLOMETHASONE DIPROPIONATE 80 MCG/ACT IN AERS: INHALATION_SPRAY | RESPIRATORY_TRACT | 12 refills | Status: DC

## 2016-10-08 ENCOUNTER — Telehealth: Payer: Self-pay | Admitting: *Deleted

## 2016-10-08 NOTE — Telephone Encounter (Signed)
Prior Authorization received from [facility] pharmacy for Qvar Redihaler. Formulary and PA form placed in provider box for completion. Asante Ritacco L, RN  

## 2016-10-10 MED ORDER — FLUTICASONE PROPIONATE HFA 110 MCG/ACT IN AERO: 2.0000 | INHALATION_SPRAY | Freq: Two times a day (BID) | RESPIRATORY_TRACT | 12 refills | Status: DC

## 2016-10-10 NOTE — Telephone Encounter (Signed)
Pts mother contacted and informed of rx up front for pick up.

## 2016-10-10 NOTE — Telephone Encounter (Signed)
flovent left at front. Please call patient for pick up. Flovent was ordered to replace Qvar as his insurance no longer covers qvar

## 2016-11-07 ENCOUNTER — Other Ambulatory Visit: Payer: Self-pay | Admitting: Student

## 2016-12-01 ENCOUNTER — Other Ambulatory Visit: Payer: Self-pay | Admitting: Student

## 2016-12-26 ENCOUNTER — Other Ambulatory Visit: Payer: Self-pay | Admitting: Student

## 2017-02-25 ENCOUNTER — Other Ambulatory Visit: Payer: Self-pay | Admitting: Emergency Medicine

## 2017-02-26 ENCOUNTER — Emergency Department (HOSPITAL_COMMUNITY): Payer: Medicaid Other

## 2017-02-26 ENCOUNTER — Encounter (HOSPITAL_COMMUNITY): Payer: Self-pay | Admitting: Emergency Medicine

## 2017-02-26 ENCOUNTER — Other Ambulatory Visit: Payer: Self-pay | Admitting: *Deleted

## 2017-02-26 ENCOUNTER — Inpatient Hospital Stay (HOSPITAL_COMMUNITY)
Admission: EM | Admit: 2017-02-26 | Discharge: 2017-03-01 | DRG: 189 | Disposition: A | Discharge status: Home / Self Care | Payer: Medicaid Other | Attending: Family Medicine | Admitting: Family Medicine

## 2017-02-26 DIAGNOSIS — J9601 Acute respiratory failure with hypoxia: Secondary | ICD-10-CM

## 2017-02-26 DIAGNOSIS — N179 Acute kidney failure, unspecified: Secondary | ICD-10-CM | POA: Diagnosis present

## 2017-02-26 DIAGNOSIS — Q871 Congenital malformation syndromes predominantly associated with short stature: Secondary | ICD-10-CM | POA: Diagnosis not present

## 2017-02-26 DIAGNOSIS — E119 Type 2 diabetes mellitus without complications: Secondary | ICD-10-CM | POA: Diagnosis present

## 2017-02-26 DIAGNOSIS — Z7951 Long term (current) use of inhaled steroids: Secondary | ICD-10-CM

## 2017-02-26 DIAGNOSIS — Z6841 Body Mass Index (BMI) 40.0 and over, adult: Secondary | ICD-10-CM

## 2017-02-26 DIAGNOSIS — E662 Morbid (severe) obesity with alveolar hypoventilation: Secondary | ICD-10-CM | POA: Diagnosis present

## 2017-02-26 DIAGNOSIS — Z7984 Long term (current) use of oral hypoglycemic drugs: Secondary | ICD-10-CM

## 2017-02-26 DIAGNOSIS — I5030 Unspecified diastolic (congestive) heart failure: Secondary | ICD-10-CM

## 2017-02-26 DIAGNOSIS — J9621 Acute and chronic respiratory failure with hypoxia: Principal | ICD-10-CM | POA: Diagnosis present

## 2017-02-26 DIAGNOSIS — J45909 Unspecified asthma, uncomplicated: Secondary | ICD-10-CM | POA: Diagnosis present

## 2017-02-26 DIAGNOSIS — E875 Hyperkalemia: Secondary | ICD-10-CM | POA: Diagnosis present

## 2017-02-26 DIAGNOSIS — E872 Acidosis: Secondary | ICD-10-CM | POA: Diagnosis present

## 2017-02-26 DIAGNOSIS — I272 Pulmonary hypertension, unspecified: Secondary | ICD-10-CM | POA: Diagnosis present

## 2017-02-26 DIAGNOSIS — I10 Essential (primary) hypertension: Secondary | ICD-10-CM | POA: Diagnosis present

## 2017-02-26 DIAGNOSIS — J9600 Acute respiratory failure, unspecified whether with hypoxia or hypercapnia: Secondary | ICD-10-CM | POA: Diagnosis not present

## 2017-02-26 DIAGNOSIS — I7300 Raynaud's syndrome without gangrene: Secondary | ICD-10-CM | POA: Diagnosis present

## 2017-02-26 DIAGNOSIS — Z833 Family history of diabetes mellitus: Secondary | ICD-10-CM

## 2017-02-26 DIAGNOSIS — N3944 Nocturnal enuresis: Secondary | ICD-10-CM | POA: Diagnosis present

## 2017-02-26 HISTORY — DX: Essential (primary) hypertension: I10

## 2017-02-26 LAB — COMPREHENSIVE METABOLIC PANEL
ALT: 23 U/L (ref 17–63)
AST: 23 U/L (ref 15–41)
Albumin: 3 g/dL — ABNORMAL LOW (ref 3.5–5.0)
Alkaline Phosphatase: 114 U/L (ref 38–126)
Anion gap: 5 (ref 5–15)
BUN: 25 mg/dL — ABNORMAL HIGH (ref 6–20)
CHLORIDE: 105 mmol/L (ref 101–111)
CO2: 28 mmol/L (ref 22–32)
CREATININE: 1.07 mg/dL (ref 0.61–1.24)
Calcium: 8.6 mg/dL — ABNORMAL LOW (ref 8.9–10.3)
GFR calc non Af Amer: >60 mL/min (ref >60)
Glucose, Bld: 139 mg/dL — ABNORMAL HIGH (ref 65–99)
Potassium: 5.6 mmol/L — ABNORMAL HIGH (ref 3.5–5.1)
SODIUM: 138 mmol/L (ref 135–145)
Total Bilirubin: 0.7 mg/dL (ref 0.3–1.2)
Total Protein: 7.6 g/dL (ref 6.5–8.1)

## 2017-02-26 LAB — CBC WITH DIFFERENTIAL/PLATELET
Basophils Absolute: 0.1 10*3/uL (ref 0.0–0.1)
Basophils Relative: 1 %
EOS ABS: 0.2 10*3/uL (ref 0.0–0.7)
Eosinophils Relative: 2 %
HCT: 39 % (ref 39.0–52.0)
Hemoglobin: 11.6 g/dL — ABNORMAL LOW (ref 13.0–17.0)
Lymphocytes Relative: 15 %
Lymphs Abs: 1.8 10*3/uL (ref 0.7–4.0)
MCH: 22.2 pg — ABNORMAL LOW (ref 26.0–34.0)
MCHC: 29.7 g/dL — ABNORMAL LOW (ref 30.0–36.0)
MCV: 74.7 fL — ABNORMAL LOW (ref 78.0–100.0)
MONO ABS: 0.6 10*3/uL (ref 0.1–1.0)
Monocytes Relative: 5 %
NEUTROS PCT: 77 %
Neutro Abs: 9.4 10*3/uL — ABNORMAL HIGH (ref 1.7–7.7)
PLATELETS: 224 10*3/uL (ref 150–400)
RBC: 5.22 MIL/uL (ref 4.22–5.81)
RDW: 18.8 % — ABNORMAL HIGH (ref 11.5–15.5)
WBC: 12.1 10*3/uL — AB (ref 4.0–10.5)

## 2017-02-26 LAB — I-STAT VENOUS BLOOD GAS, ED
Acid-Base Excess: 3 mmol/L — ABNORMAL HIGH (ref 0.0–2.0)
Bicarbonate: 31.6 mmol/L — ABNORMAL HIGH (ref 20.0–28.0)
O2 SAT: 95 %
PCO2 VEN: 65 mmHg — AB (ref 44.0–60.0)
PH VEN: 7.295 (ref 7.250–7.430)
PO2 VEN: 90 mmHg — AB (ref 32.0–45.0)
TCO2: 34 mmol/L (ref 0–100)

## 2017-02-26 LAB — BRAIN NATRIURETIC PEPTIDE: B Natriuretic Peptide: 59.9 pg/mL (ref 0.0–100.0)

## 2017-02-26 LAB — I-STAT TROPONIN, ED: Troponin i, poc: 0.01 ng/mL (ref 0.00–0.08)

## 2017-02-26 MED ORDER — ALBUTEROL SULFATE (2.5 MG/3ML) 0.083% IN NEBU
5.0000 mg | INHALATION_SOLUTION | Freq: Once | RESPIRATORY_TRACT | Status: AC
  Administered 2017-02-26: 5 mg via RESPIRATORY_TRACT
  Filled 2017-02-26: qty 6

## 2017-02-26 MED ORDER — IPRATROPIUM BROMIDE 0.02 % IN SOLN
0.5000 mg | Freq: Once | RESPIRATORY_TRACT | Status: AC
  Administered 2017-02-26: 0.5 mg via RESPIRATORY_TRACT
  Filled 2017-02-26: qty 2.5

## 2017-02-26 MED ORDER — METHYLPREDNISOLONE SODIUM SUCC 125 MG IJ SOLR: 125.0000 mg | Freq: Once | INTRAMUSCULAR | Status: DC

## 2017-02-26 NOTE — ED Notes (Signed)
Attempted IV x 1 with no success.

## 2017-02-26 NOTE — ED Provider Notes (Signed)
[facility] DEPT Provider Note   CSN: [id number] Arrival date & time: 02/26/17  2048     History   Chief Complaint Chief Complaint  Patient presents with  . Shortness of Breath    HPI [name] is a 21 y.o. male.  HPI [name] is a 21 y.o. male with hx of Prader-Willi syndrome, obesity, hypoventilation syndrome, pulmonary htn, DM, asthma, presents to ED with shortness of breath. Pt apparently was visiting a family member and fell asleep on the couch, when Mother noticed that patient's lips and fingertips are turning blue. She woke him up, and color returned, however over the next several minutes his lips and fingers turned blue again, with patient being awake. EMS was called, they placed him on oxygen right away, with good return of color. Mother states the patient does wear a BiPAP machine at home with 4 L of oxygen. He did not have the machine on when he fell asleep on the couch. Patient has not been ill recently, denies any cough, no wheezing. Patient has had similar symptoms in the past and required admission and oxygen. He has been doing well recently however. Family denies any increased swelling in extremities. They do state the patient is not acting like himself. They stated that he is normally talkative and joking, tonight he seems tired and lethargic.  Past Medical History:  Diagnosis Date  . Asthma   . Diabetes mellitus without complication ([place])   . Hypertension    Pulmomary htn  . Kidney dysfunction    left  . Obesity   . Obesity hypoventilation syndrome ([place])   . Prader-Willi syndrome   . Raynaud's phenomenon   . Sleep apnea     Patient Active Problem List   Diagnosis Date Noted  . Conjunctivitis 08/22/2015  . Accidental overdose 02/22/2015  . Accidental lisinopril ingestion 02/22/2015  . Tremor bilateral hands 10/27/2014  . Diabetes mellitus type 2 in obese ([place]) 09/27/2014  . Shortness of breath   . Respiratory failure ([place]) 09/15/2014  .  Dyspnea   . Hypoventilation syndrome   . Hypoxia   . SOB (shortness of breath)   . Intertrigo 06/30/2013  . Allergic rhinitis 01/07/2013  . Obesity hypoventilation syndrome ([place]) 10/05/2010  . VISUAL IMPAIRMENT 11/13/2009  . RAYNAUD'S SYNDROME 08/10/2009  . Essential hypertension, benign 01/07/2008  . ORTHOSTATIC PROTEINURIA 01/07/2008  . OBESITY, MORBID 02/27/2007  . 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 Asthma 10/16/2006  . SYMPTOM, ENURESIS, NOCTURNAL 10/16/2006    Past Surgical History:  Procedure Laterality Date  . NISSEN FUNDOPLICATION    . TONSILLECTOMY         Home Medications    Prior to Admission medications   Medication Sig Start Date End Date Taking? Authorizing Provider  albuterol (PROVENTIL) (2.5 MG/3ML) 0.083% nebulizer solution inhale contents of 1 vial in nebulizer once daily 11/07/16  Yes Haney, Alyssa A, MD  cetirizine (ZYRTEC) 10 MG tablet take 1 tablet by mouth once daily 12/27/16  Yes Haney, Alyssa A, MD  desmopressin (DDAVP) 0.2 MG tablet Take 0.4 mg by mouth at bedtime.    Yes [provider]  fluticasone (FLONASE) 50 MCG/ACT nasal spray instill 2 sprays into each nostril once daily 12/02/16  Yes Haney, Alyssa A, MD  fluticasone (FLOVENT HFA) 110 MCG/ACT inhaler Inhale 2 puffs into the lungs 2 (two) times daily. 10/10/16  Yes Haney, Amedeo Plenty, MD  lisinopril-hydrochlorothiazide (PRINZIDE,ZESTORETIC) 20-12.5 MG tablet take 1 tablet by mouth once daily 06/14/16  Yes Haney,  Alyssa A, MD  metFORMIN (GLUCOPHAGE-XR) 500 MG 24 hr tablet take 1 tablet by mouth once daily WITH BREAKFAST 12/02/16  Yes Haney, Alyssa A, MD  montelukast (SINGULAIR) 10 MG tablet take 1 tablet by mouth at bedtime 03/25/16  Yes Haney, Alyssa A, MD  oxybutynin (DITROPAN) 5 MG tablet Take 1 tablet (5 mg total) by mouth at bedtime. Patient taking differently: Take 10 mg by mouth at bedtime.  04/20/12  Yes [place], Samuel Germany, MD  PROAIR HFA 108 [phone] Base) MCG/ACT inhaler inhale 2 puff by mouth  every 6 hours if needed 05/09/16  Yes Byrum, Rose Fillers, MD  ACCU-CHEK AVIVA PLUS test strip as directed Patient not taking: Reported on 02/26/2017 11/02/15   Leone Haven, MD  ACCU-CHEK SOFTCLIX LANCETS lancets Use as instructed Patient not taking: Reported on 02/26/2017 11/02/15   Melancon, York Ram, MD  bacitracin ointment Apply 1 application topically 2 (two) times daily. Patient not taking: Reported on 02/26/2017 01/08/16   Ward, Ozella Almond, PA-C  carboxymethylcellulose 1 % ophthalmic solution Apply 1 drop to eye 3 (three) times daily. Patient not taking: Reported on 02/26/2017 08/21/15   Veatrice Bourbon, MD  Lancets 30G MISC 1 Device by Does not apply route daily before breakfast. Patient not taking: Reported on 02/26/2017 10/06/14   Leone Haven, MD  Misc. Devices MISC Please provide a pulse oximeter for the ear lobe. Patient has diagnosis of Raynaud, asthma Patient not taking: Reported on 02/26/2017 09/16/11   Judithann Sheen, MD  oseltamivir (TAMIFLU) 75 MG capsule Take 1 capsule (75 mg total) by mouth 2 (two) times daily. Patient not taking: Reported on 02/26/2017 08/29/16   Veatrice Bourbon, MD  Respiratory Therapy Supplies (NEBULIZER/ADULT MASK) KIT 1 Device by Does not apply route once. Patient not taking: Reported on 02/26/2017 05/20/14   Leone Haven, MD  Respiratory Therapy Supplies MISC BiPap, titration setting 20/15 Patient not taking: Reported on 02/26/2017 08/05/12   Verdie Drown, Samuel Germany, MD  Spacer/Aero-Holding Chambers (AEROCHAMBER MV) inhaler Use with albuterol inhaler Patient not taking: Reported on 02/26/2017 04/20/12   Verdie Drown, Samuel Germany, MD    Family History No family history on file.  Social History Social History  Substance Use Topics  . Smoking status: Never Smoker  . Smokeless tobacco: Never Used  . Alcohol use No     Allergies   Patient has no known allergies.   Review of Systems Review of Systems  Constitutional: Negative for chills and fever.    Respiratory: Positive for chest tightness and shortness of breath. Negative for cough.   Cardiovascular: Positive for chest pain. Negative for palpitations and leg swelling.  Gastrointestinal: Negative for abdominal distention, abdominal pain, diarrhea, nausea and vomiting.  Genitourinary: Negative for dysuria, frequency, hematuria and urgency.  Musculoskeletal: Negative for arthralgias, myalgias, neck pain and neck stiffness.  Skin: Negative for rash.  Allergic/Immunologic: Negative for immunocompromised state.  Neurological: Negative for dizziness, weakness, light-headedness, numbness and headaches.  Psychiatric/Behavioral: Positive for confusion.  All other systems reviewed and are negative.    Physical Exam Updated Vital Signs BP (!) 147/108 (BP Location: Right Arm)   Pulse (!) 106   Temp 99.8 F (37.7 C) (Oral)   Resp (!) 28   Ht '4\' 6"'  (1.372 m)   Wt (!) 166.9 kg (368 lb)   SpO2 100%   BMI 88.73 kg/m   Physical Exam  Constitutional: He appears well-developed and well-nourished. No distress.  Morbidly obese  HENT:  Head: Normocephalic and atraumatic.  Mouth/Throat: Oropharynx is clear and moist.  Eyes: Conjunctivae are normal.  Neck: Normal range of motion. Neck supple.  Cardiovascular: Normal rate, regular rhythm and normal heart sounds.   Pulmonary/Chest: He has no wheezes. He has no rales.  Mild accessory muscle use with breathing. No wheezing or rales  Abdominal: Soft. Bowel sounds are normal. He exhibits no distension. There is no tenderness. There is no rebound.  Musculoskeletal: He exhibits no edema.  Neurological: He is alert.  Skin: Skin is warm and dry.  Nursing note and vitals reviewed.    ED Treatments / Results  Labs (all labs ordered are listed, but only abnormal results are displayed) Labs Reviewed  I-STAT VENOUS BLOOD GAS, ED - Abnormal; Notable for the following:       Result Value   pCO2, Ven 65.0 (*)    pO2, Ven 90.0 (*)    Bicarbonate  31.6 (*)    Acid-Base Excess 3.0 (*)    All other components within normal limits  CBC WITH DIFFERENTIAL/PLATELET  COMPREHENSIVE METABOLIC PANEL  BLOOD GAS, VENOUS  BRAIN NATRIURETIC PEPTIDE  I-STAT TROPONIN, ED    EKG  EKG Interpretation None       Radiology Dg Chest Port 1 View  Result Date: 02/26/2017 CLINICAL DATA:  Shortness of breath, chest pain. EXAM: PORTABLE CHEST 1 VIEW COMPARISON:  Radiographs of September 17, 2014. FINDINGS: Stable cardiomegaly and central pulmonary vascular congestion. No pneumothorax or pleural effusion is noted. Mild right basilar opacities are noted concerning for edema or atelectasis. Bony thorax is unremarkable. IMPRESSION: Stable cardiomegaly and central pulmonary vascular congestion. Mild bibasilar edema or atelectasis is noted. Electronically Signed   By: Marijo Conception, M.D.   On: 02/26/2017 21:36    Procedures Procedures (including critical care time)  Medications Ordered in ED Medications  albuterol (PROVENTIL) (2.5 MG/3ML) 0.083% nebulizer solution 5 mg (not administered)  ipratropium (ATROVENT) nebulizer solution 0.5 mg (not administered)  methylPREDNISolone sodium succinate (SOLU-MEDROL) 125 mg/2 mL injection 125 mg (not administered)     Initial Impression / Assessment and Plan / ED Course  I have reviewed the triage vital signs and the nursing notes.  Pertinent labs & imaging results that were available during my care of the patient were reviewed by me and considered in my medical decision making (see chart for details).     Patient in emergency department with respiratory failure, hypoxia, although I'm not sure what his oxygen saturation was upon EMS arrival. Mother reports patient's lips and fingertips were blue. Patient is morbidly obese, with history of hypoventilation, requiring oxygen at all times in the past. Currently he only receives oxygen at nighttime with his BiPAP machine. I took his oxygen off while examining him,  and his oxygen saturation dropped to 87% on room air. Placed him on 2 L nasal cannula, oxygen came up to 92%. Will check chest x-ray, labs, blood gas.  11:52 PM Labs with no significant abnormalities. Will add ABG. CXR showing central vascular congestion. Due to acute hypoxia, will admit. Spoke with family practice, who will admit pt.   Vitals:   02/26/17 2215 02/26/17 2230 02/26/17 2245 02/26/17 2315  BP:      Pulse: 96 (!) 112 97   Resp: (!) 27 (!) 45 (!) 34   Temp:      TempSrc:      SpO2: 92% (!) 88% 93%   Weight:    (!) 169.9 kg (374 lb 8 oz)  Height:  Final Clinical Impressions(s) / ED Diagnoses   Final diagnoses:  Acute respiratory failure with hypoxia [facility])    New Prescriptions New Prescriptions   No medications on file     Jeannett Senior, Hershal Coria 02/26/17 2353    Drenda Freeze, MD 02/27/17 [phone]

## 2017-02-26 NOTE — ED Triage Notes (Signed)
Per EMS, Pt has hx of Prader-Willi syndrome. Pt fell asleep on the sofa for about ten minutes and his mother found him with cyanosis on lips and fingers and belly breathing. When EMS arrived, pt was no longer cyanotic. Lungs clear. Pt on 100% non-rebreather. Hx of HTN in which pt takes Lisinopril and hx of sleep apnea which he wears bipap at night. Pt denies pain. Pt A&Ox1, baseline MR. Mother is legal guardian. EMS VS 150/90 and HR 100-110.

## 2017-02-26 NOTE — H&P (Signed)
[facility] Admission History and Physical Service Pager: [phone]  Patient name: [name] Medical record number: [id number] Date of birth: 11/13/1995 Age: 21 y.o. Gender: male  Primary Care Provider: Mayo, Katy Dodd, MD Consultants: none  Code Status: Full  Chief Complaint: Shortness of breath   Assessment and Plan: [name] is a 21 y.o. male presenting with SOB x 1 day . PMH is significant for Prader-Willi syndrome, obesity, obesity hypoventilation, sleep apnea, asthma, enuresis, T2DM, Raynauds phenomenon & congenital left kidney dysfunction.   Shortness of breath Patient reported to have SOB and cyanosis around lips and in fingertips. Patient had increased work of breathing and was leaning forward to breath. Denies cough. Patient at home is on albuterol at bedtime and on bipap nightly with history of asthma and obesity hypoventilation syndrome. Less likely pneumonia, given patient afebrile at this time and CXR showing stable cardiomegaly and central pulmonary vascular congestion with mild bibasilar edema or atelectasis noted. Though does have slight leukocytosis to 12.1. Clear breath sounds without wheezing on exam. Less likely cardiac in origin given negative istat troponin, BNP of 59.9, and EKG showing sinus tachycardia with no ST elevations. Patient in ED was on non-rebreather sating at 93%. Mother noted that when patient removed O2 to use restroom, O2 sats dropped to 50s. Denies fever or chills. Denies sick contacts. ABG showing pH 7.287, pCO2 62.6, PO2 84.0, and bicarbonate 30.0, consistent with compensated respiratory acidosis suggestive of more chronic process. ABG values are similar to prior admission when patient's bipap mask was ill-fitting. Weight about 15 lbs less than upon admission 2 years ago.  -admitted to step-down with telemetry, attending Dr. Eniola  -albuterol at bedtime -continue home meds of flonase, flovent, loratadine, and  montelukast -duoneb q6 -supplemental O2 to keep O2 saturation >90% -bipap at bedtime  -continuous pulse ox -routine vital signs  -am CBC and BMP -consider am ABG if patient not closer to his  -follows with Dr. Byrum; consider Pulmonology consult in a.m. -PT consult  Hyperkalemia K of 5.6 on admission. May be secondary to ACEi.  -continue to monitor -may improve with SSI -could dose once with lasix, as has trace LE edema, or kayexalate if increases  T2DM Glucose of 139 on admission. A1C on 03/06/2016 of 6.4. On metformin 500 mg XR at home. -sensitive SSI -qACHS CBGs  Enuresis Mother reports nighttime enuresis -continue home oxybutynin at bedtime  -continue home desmopressin   HTN BP of 126/97 on admission -continue home lisinopril and hydrochlorothiazide   Morbid Obesity Secondary to Prader-Willi syndrome -limit to 1200 kcal a day -Nutrition consult  FEN/GI: carb modified Prophylaxis: lovenox   Disposition: continue to monitor   History of Present Illness:  [name] is a 21 y.o. male presenting with SOB beginning this afternoon. Patient was visiting his grandmother with his mother when mother noticed patient began to have cyanosis around lips and fingertips. Mother reports patient has not "been himself" all day. Patient was less talkative and active, and mother noticed patient has been nodding off more frequently. Denies coughing or complaints of SOB.  After dinner, patient had a headache. Mother gave him an ibuprofen. He then went to take a nap. His mother noticed that his lips and fingers were turning blue. Patient has never had history of similar symptoms. Mother also noticed patient had increased work of breathing and is pulling to breath more. Mother noted that patient was leaning on table to breathe. Prior to EMS arrival,   patient pointed to his chest when mother asked if anything hurt but denied pain when EMS arrived. Patient normally wears bi-pap at home at night  but does not use during naps. Patient had sleep study about 2 years ago, with no change in setting of bipap. Mother believes he is currently on 10/20 with O2 at 5L. Patient is not on continuous O2 supplementation at home. Patient takes nightly albuterol but has not needed prn. Patient did not have albuterol inhaler with him while visiting his grandmother. Last night mother noticed patient fell asleep with blanket covering bipap machine; she is concerned he could have been trapping CO2 and breathing more in. Mother stated that patient had similar episode prior to admission in February 2016, when patient was admitted with SOB and elevated CO2 levels; at that time patient's bipap mask was thought to be a poor fit. Previously discharged on home O2 but has not needed for about 2 years. Patient normally lives an active lifestyle and is able to walk around track at [facility] twice around. No known sick contacts. Patient has not had cough or fevers. No change in daily routine.   Review Of Systems: Per HPI with the following additions:   Review of Systems  Constitutional: Positive for malaise/fatigue. Negative for chills and fever.  HENT: Negative for congestion and sore throat.   Eyes: Negative for pain.  Respiratory: Positive for shortness of breath. Negative for cough.   Cardiovascular: Positive for leg swelling. Negative for chest pain.  Gastrointestinal: Negative for nausea and vomiting.  Genitourinary: Negative.   Musculoskeletal: Negative for falls and myalgias.  Skin: Negative for rash.  Neurological: Positive for headaches.    Patient Active Problem List   Diagnosis Date Noted  . Acute respiratory failure ([place]) 02/27/2017  . Conjunctivitis 08/22/2015  . Accidental overdose 02/22/2015  . Accidental lisinopril ingestion 02/22/2015  . Tremor bilateral hands 10/27/2014  . Diabetes mellitus type 2 in obese ([place]) 09/27/2014  . Shortness of breath   . Respiratory failure ([place]) 09/15/2014  . Dyspnea    . Hypoventilation syndrome   . Hypoxia   . SOB (shortness of breath)   . Intertrigo 06/30/2013  . Allergic rhinitis 01/07/2013  . Obesity hypoventilation syndrome ([place]) 10/05/2010  . VISUAL IMPAIRMENT 11/13/2009  . RAYNAUD'S SYNDROME 08/10/2009  . Essential hypertension, benign 01/07/2008  . ORTHOSTATIC PROTEINURIA 01/07/2008  . OBESITY, MORBID 02/27/2007  . 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 Asthma 10/16/2006  . SYMPTOM, ENURESIS, NOCTURNAL 10/16/2006    Past Medical History: Past Medical History:  Diagnosis Date  . Asthma   . Diabetes mellitus without complication ([place])   . Hypertension    Pulmomary htn  . Kidney dysfunction    left  . Obesity   . Obesity hypoventilation syndrome ([place])   . Prader-Willi syndrome   . Raynaud's phenomenon   . Sleep apnea     Past Surgical History: Past Surgical History:  Procedure Laterality Date  . NISSEN FUNDOPLICATION    . TONSILLECTOMY      Social History: Social History  Substance Use Topics  . Smoking status: Never Smoker  . Smokeless tobacco: Never Used  . Alcohol use No   Additional social history: mom and brother (5), no smoking, no drinking, no drug use Please also refer to relevant sections of EMR.  Family History: No family history on file. Grandmother with diabetes and high blood pressure. Various family members have had cancer.   Allergies and Medications: No Known Allergies No current facility-administered medications on  file prior to encounter.    Current Outpatient Prescriptions on File Prior to Encounter  Medication Sig Dispense Refill  . albuterol (PROVENTIL) (2.5 MG/3ML) 0.083% nebulizer solution inhale contents of 1 vial in nebulizer once daily 75 mL 12  . cetirizine (ZYRTEC) 10 MG tablet take 1 tablet by mouth once daily 30 tablet 2  . desmopressin (DDAVP) 0.2 MG tablet Take 0.4 mg by mouth at bedtime.     . fluticasone (FLONASE) 50 MCG/ACT nasal spray instill 2 sprays into each nostril once daily  16 g 6  . fluticasone (FLOVENT HFA) 110 MCG/ACT inhaler Inhale 2 puffs into the lungs 2 (two) times daily. 1 Inhaler 12  . lisinopril-hydrochlorothiazide (PRINZIDE,ZESTORETIC) 20-12.5 MG tablet take 1 tablet by mouth once daily 90 tablet 3  . metFORMIN (GLUCOPHAGE-XR) 500 MG 24 hr tablet take 1 tablet by mouth once daily WITH BREAKFAST 30 tablet 2  . montelukast (SINGULAIR) 10 MG tablet take 1 tablet by mouth at bedtime 90 tablet 3  . oxybutynin (DITROPAN) 5 MG tablet Take 1 tablet (5 mg total) by mouth at bedtime. (Patient taking differently: Take 10 mg by mouth at bedtime. ) 30 tablet 3  . PROAIR HFA 108 (90 Base) MCG/ACT inhaler inhale 2 puff by mouth every 6 hours if needed 8.5 Inhaler 0  . ACCU-CHEK AVIVA PLUS test strip as directed (Patient not taking: Reported on 02/26/2017) 100 each 12  . ACCU-CHEK SOFTCLIX LANCETS lancets Use as instructed (Patient not taking: Reported on 02/26/2017) 100 each 12  . bacitracin ointment Apply 1 application topically 2 (two) times daily. (Patient not taking: Reported on 02/26/2017) 120 g 0  . carboxymethylcellulose 1 % ophthalmic solution Apply 1 drop to eye 3 (three) times daily. (Patient not taking: Reported on 02/26/2017) 30 mL 12  . Lancets 30G MISC 1 Device by Does not apply route daily before breakfast. (Patient not taking: Reported on 02/26/2017) 100 each 3  . Misc. Devices MISC Please provide a pulse oximeter for the ear lobe. Patient has diagnosis of Raynaud, asthma (Patient not taking: Reported on 02/26/2017) 1 each 0  . oseltamivir (TAMIFLU) 75 MG capsule Take 1 capsule (75 mg total) by mouth 2 (two) times daily. (Patient not taking: Reported on 02/26/2017) 10 capsule 0  . Respiratory Therapy Supplies (NEBULIZER/ADULT MASK) KIT 1 Device by Does not apply route once. (Patient not taking: Reported on 02/26/2017) 1 each 0  . Respiratory Therapy Supplies MISC BiPap, titration setting 20/15 (Patient not taking: Reported on 02/26/2017) 1 each 0  .  Spacer/Aero-Holding Chambers (AEROCHAMBER MV) inhaler Use with albuterol inhaler (Patient not taking: Reported on 02/26/2017) 1 each 1    Objective: BP (!) 131/101   Pulse 98   Temp 99.8 F (37.7 C) (Oral)   Resp (!) 24   Ht 4' 6" (1.372 m)   Wt (!) 374 lb 8 oz (169.9 kg)   SpO2 96%   BMI 90.30 kg/m  Exam: General: awake and alert, NAD, oriented to person and place (baseline), able to follow commands  Eyes: PERRL, EOMI, mild exotropia R eye  ENTM: moist mucous membranes, Mallampati Class IV Cardiovascular: RRR, no MRG Respiratory: CTAB, no wheezes, rales or rhonchi Gastrointestinal: soft, non tender, non distended, bowel sounds x4 quadrants  MSK: trace LE edema, 5/5 muscle strength bilaterally  Derm: skin intact, tattoo present  Neuro: awake and alert, oriented to person and place (hospital), able to follow commands, 5/5 muscle strength  Psych: normal mood and affect   Labs and Imaging:   CBC BMET   Recent Labs Lab 02/26/17 2202  WBC 12.1*  HGB 11.6*  HCT 39.0  PLT 224    Recent Labs Lab 02/26/17 2202  NA 138  K 5.6*  CL 105  CO2 28  BUN 25*  CREATININE 1.07  GLUCOSE 139*  CALCIUM 8.6*     ABG    Component Value Date/Time   PHART 7.287 (L) 02/27/2017 0014   PCO2ART 62.6 (H) 02/27/2017 0014   PO2ART 84.0 02/27/2017 0014   HCO3 30.0 (H) 02/27/2017 0014   TCO2 32 02/27/2017 0014   O2SAT 95.0 02/27/2017 0014   Dg Chest Port 1 View  Result Date: 02/26/2017 CLINICAL DATA:  Shortness of breath, chest pain. EXAM: PORTABLE CHEST 1 VIEW COMPARISON:  Radiographs of September 17, 2014. FINDINGS: Stable cardiomegaly and central pulmonary vascular congestion. No pneumothorax or pleural effusion is noted. Mild right basilar opacities are noted concerning for edema or atelectasis. Bony thorax is unremarkable. IMPRESSION: Stable cardiomegaly and central pulmonary vascular congestion. Mild bibasilar edema or atelectasis is noted. Electronically Signed   By: James  Green Jr,  M.D.   On: 02/26/2017 21:36     Abraham, Sherin, DO 02/27/2017, 1:58 AM PGY-1, [facility] FPTS Intern pager: [phone], text pages welcome  Upper Level Addendum:  I have seen and evaluated this patient along with Dr. Abraham and reviewed the above note, making necessary revisions in green.  Fitzgerald, Hillary Moen, MD PGY-3,  [facility] 02/27/2017 2:13 AM   

## 2017-02-26 NOTE — ED Notes (Signed)
Pt ambulated to restroom with assistance. Pt's SpO2 saturations dropped into the 50s and his heart rate went into the 140s. Pt breathing fast and reports getting tired but no other complaints at this time. Pt placed back on 4L of [place] and encouraged to breathe in through nose.

## 2017-02-27 ENCOUNTER — Inpatient Hospital Stay (HOSPITAL_COMMUNITY): Payer: Medicaid Other

## 2017-02-27 DIAGNOSIS — E119 Type 2 diabetes mellitus without complications: Secondary | ICD-10-CM | POA: Insufficient documentation

## 2017-02-27 DIAGNOSIS — I7300 Raynaud's syndrome without gangrene: Secondary | ICD-10-CM | POA: Diagnosis present

## 2017-02-27 DIAGNOSIS — E662 Morbid (severe) obesity with alveolar hypoventilation: Secondary | ICD-10-CM | POA: Diagnosis present

## 2017-02-27 DIAGNOSIS — N179 Acute kidney failure, unspecified: Secondary | ICD-10-CM | POA: Diagnosis present

## 2017-02-27 DIAGNOSIS — E872 Acidosis: Secondary | ICD-10-CM | POA: Diagnosis present

## 2017-02-27 DIAGNOSIS — Z7951 Long term (current) use of inhaled steroids: Secondary | ICD-10-CM | POA: Diagnosis not present

## 2017-02-27 DIAGNOSIS — J9601 Acute respiratory failure with hypoxia: Secondary | ICD-10-CM | POA: Diagnosis not present

## 2017-02-27 DIAGNOSIS — I5030 Unspecified diastolic (congestive) heart failure: Secondary | ICD-10-CM | POA: Insufficient documentation

## 2017-02-27 DIAGNOSIS — R0602 Shortness of breath: Secondary | ICD-10-CM | POA: Diagnosis present

## 2017-02-27 DIAGNOSIS — Z7984 Long term (current) use of oral hypoglycemic drugs: Secondary | ICD-10-CM | POA: Diagnosis not present

## 2017-02-27 DIAGNOSIS — J9600 Acute respiratory failure, unspecified whether with hypoxia or hypercapnia: Secondary | ICD-10-CM | POA: Diagnosis not present

## 2017-02-27 DIAGNOSIS — J45909 Unspecified asthma, uncomplicated: Secondary | ICD-10-CM | POA: Diagnosis present

## 2017-02-27 DIAGNOSIS — Z6841 Body Mass Index (BMI) 40.0 and over, adult: Secondary | ICD-10-CM | POA: Diagnosis not present

## 2017-02-27 DIAGNOSIS — I272 Pulmonary hypertension, unspecified: Secondary | ICD-10-CM | POA: Diagnosis present

## 2017-02-27 DIAGNOSIS — E875 Hyperkalemia: Secondary | ICD-10-CM | POA: Diagnosis present

## 2017-02-27 DIAGNOSIS — J9621 Acute and chronic respiratory failure with hypoxia: Secondary | ICD-10-CM | POA: Diagnosis present

## 2017-02-27 DIAGNOSIS — I10 Essential (primary) hypertension: Secondary | ICD-10-CM | POA: Diagnosis present

## 2017-02-27 DIAGNOSIS — Z833 Family history of diabetes mellitus: Secondary | ICD-10-CM | POA: Diagnosis not present

## 2017-02-27 DIAGNOSIS — N3944 Nocturnal enuresis: Secondary | ICD-10-CM | POA: Diagnosis present

## 2017-02-27 LAB — I-STAT ARTERIAL BLOOD GAS, ED
Acid-Base Excess: 2 mmol/L (ref 0.0–2.0)
Acid-Base Excess: 3 mmol/L — ABNORMAL HIGH (ref 0.0–2.0)
Bicarbonate: 30 mmol/L — ABNORMAL HIGH (ref 20.0–28.0)
Bicarbonate: 31.2 mmol/L — ABNORMAL HIGH (ref 20.0–28.0)
O2 SAT: 95 %
O2 SAT: 97 %
PCO2 ART: 62.6 mmHg — AB (ref 32.0–48.0)
PCO2 ART: 64.6 mmHg — AB (ref 32.0–48.0)
PO2 ART: 104 mmHg (ref 83.0–108.0)
Patient temperature: 98.6
TCO2: 32 mmol/L (ref 0–100)
TCO2: 33 mmol/L (ref 0–100)
pH, Arterial: 7.287 — ABNORMAL LOW (ref 7.350–7.450)
pH, Arterial: 7.291 — ABNORMAL LOW (ref 7.350–7.450)
pO2, Arterial: 84 mmHg (ref 83.0–108.0)

## 2017-02-27 LAB — CBC
HEMATOCRIT: 36.8 % — AB (ref 39.0–52.0)
HEMOGLOBIN: 10.7 g/dL — AB (ref 13.0–17.0)
MCH: 21.8 pg — AB (ref 26.0–34.0)
MCHC: 29.1 g/dL — AB (ref 30.0–36.0)
MCV: 74.9 fL — ABNORMAL LOW (ref 78.0–100.0)
Platelets: 244 10*3/uL (ref 150–400)
RBC: 4.91 MIL/uL (ref 4.22–5.81)
RDW: 18.9 % — ABNORMAL HIGH (ref 11.5–15.5)
WBC: 9.9 10*3/uL (ref 4.0–10.5)

## 2017-02-27 LAB — BASIC METABOLIC PANEL
ANION GAP: 6 (ref 5–15)
ANION GAP: 8 (ref 5–15)
BUN: 26 mg/dL — ABNORMAL HIGH (ref 6–20)
BUN: 22 mg/dL — AB (ref 6–20)
CHLORIDE: 106 mmol/L (ref 101–111)
CHLORIDE: 106 mmol/L (ref 101–111)
CO2: 28 mmol/L (ref 22–32)
CO2: 26 mmol/L (ref 22–32)
Calcium: 8.6 mg/dL — ABNORMAL LOW (ref 8.9–10.3)
Calcium: 8.9 mg/dL (ref 8.9–10.3)
Creatinine, Ser: 1.16 mg/dL (ref 0.61–1.24)
Creatinine, Ser: 0.94 mg/dL (ref 0.61–1.24)
GFR calc Af Amer: >60 mL/min (ref >60)
GFR calc Af Amer: >60 mL/min (ref >60)
GFR calc non Af Amer: >60 mL/min (ref >60)
GFR calc non Af Amer: >60 mL/min (ref >60)
GLUCOSE: 91 mg/dL (ref 65–99)
Glucose, Bld: 115 mg/dL — ABNORMAL HIGH (ref 65–99)
POTASSIUM: 5.6 mmol/L — AB (ref 3.5–5.1)
POTASSIUM: 5.7 mmol/L — AB (ref 3.5–5.1)
SODIUM: 140 mmol/L (ref 135–145)
Sodium: 140 mmol/L (ref 135–145)

## 2017-02-27 LAB — GLUCOSE, CAPILLARY
GLUCOSE-CAPILLARY: 124 mg/dL — AB (ref 65–99)
Glucose-Capillary: 98 mg/dL (ref 65–99)
Glucose-Capillary: 108 mg/dL — ABNORMAL HIGH (ref 65–99)
Glucose-Capillary: 152 mg/dL — ABNORMAL HIGH (ref 65–99)

## 2017-02-27 LAB — TSH: TSH: 6.959 u[IU]/mL — AB (ref 0.350–4.500)

## 2017-02-27 LAB — HIV ANTIBODY (ROUTINE TESTING W REFLEX): HIV Screen 4th Generation wRfx: NONREACTIVE

## 2017-02-27 LAB — PATHOLOGIST SMEAR REVIEW

## 2017-02-27 LAB — MRSA PCR SCREENING: MRSA by PCR: NEGATIVE

## 2017-02-27 MED ORDER — ACETAMINOPHEN 650 MG RE SUPP: 650.0000 mg | Freq: Four times a day (QID) | RECTAL | Status: DC | PRN

## 2017-02-27 MED ORDER — METFORMIN HCL ER 500 MG PO TB24: ORAL_TABLET | ORAL | 0 refills | Status: DC

## 2017-02-27 MED ORDER — FLUTICASONE PROPIONATE 50 MCG/ACT NA SUSP
2.0000 | Freq: Every day | NASAL | Status: DC
  Administered 2017-02-28 – 2017-03-01 (×2): 2 via NASAL
  Filled 2017-02-27: qty 16

## 2017-02-27 MED ORDER — INSULIN ASPART 100 UNIT/ML ~~LOC~~ SOLN
0.0000 [IU] | Freq: Three times a day (TID) | SUBCUTANEOUS | Status: DC
  Administered 2017-02-27: 2 [IU] via SUBCUTANEOUS

## 2017-02-27 MED ORDER — MONTELUKAST SODIUM 10 MG PO TABS
10.0000 mg | ORAL_TABLET | Freq: Every day | ORAL | Status: DC
  Administered 2017-02-27 – 2017-02-28 (×2): 10 mg via ORAL
  Filled 2017-02-27 (×2): qty 1

## 2017-02-27 MED ORDER — IBUPROFEN 600 MG PO TABS
600.0000 mg | ORAL_TABLET | Freq: Four times a day (QID) | ORAL | Status: DC | PRN
  Administered 2017-02-27 – 2017-03-01 (×3): 600 mg via ORAL
  Filled 2017-02-27 (×3): qty 1

## 2017-02-27 MED ORDER — PERFLUTREN LIPID MICROSPHERE
1.0000 mL | INTRAVENOUS | Status: AC | PRN
  Administered 2017-02-27: 4 mL via INTRAVENOUS
  Filled 2017-02-27: qty 10

## 2017-02-27 MED ORDER — LORATADINE 10 MG PO TABS
10.0000 mg | ORAL_TABLET | Freq: Every day | ORAL | Status: DC
  Administered 2017-02-27 – 2017-03-01 (×3): 10 mg via ORAL
  Filled 2017-02-27 (×3): qty 1

## 2017-02-27 MED ORDER — ALBUTEROL SULFATE (2.5 MG/3ML) 0.083% IN NEBU
3.0000 mL | INHALATION_SOLUTION | Freq: Every day | RESPIRATORY_TRACT | Status: DC
  Administered 2017-02-27: 3 mL via RESPIRATORY_TRACT
  Filled 2017-02-27 (×2): qty 3

## 2017-02-27 MED ORDER — DESMOPRESSIN ACETATE 0.2 MG PO TABS
0.4000 mg | ORAL_TABLET | Freq: Every day | ORAL | Status: DC
  Administered 2017-02-27 – 2017-02-28 (×2): 0.4 mg via ORAL
  Filled 2017-02-27 (×3): qty 2

## 2017-02-27 MED ORDER — ACETAMINOPHEN 325 MG PO TABS
650.0000 mg | ORAL_TABLET | Freq: Four times a day (QID) | ORAL | Status: DC | PRN
  Administered 2017-02-27 – 2017-03-01 (×2): 650 mg via ORAL
  Filled 2017-02-27 (×2): qty 2

## 2017-02-27 MED ORDER — HYDROCHLOROTHIAZIDE 12.5 MG PO CAPS
12.5000 mg | ORAL_CAPSULE | Freq: Every day | ORAL | Status: DC
  Administered 2017-02-27 – 2017-03-01 (×3): 12.5 mg via ORAL
  Filled 2017-02-27 (×3): qty 1

## 2017-02-27 MED ORDER — BUDESONIDE 0.5 MG/2ML IN SUSP
0.5000 mg | Freq: Two times a day (BID) | RESPIRATORY_TRACT | Status: DC
  Administered 2017-02-27 – 2017-03-01 (×5): 0.5 mg via RESPIRATORY_TRACT
  Filled 2017-02-27 (×6): qty 2

## 2017-02-27 MED ORDER — LISINOPRIL 20 MG PO TABS
20.0000 mg | ORAL_TABLET | Freq: Every day | ORAL | Status: DC
  Administered 2017-02-27 – 2017-02-28 (×2): 20 mg via ORAL
  Filled 2017-02-27 (×2): qty 1

## 2017-02-27 MED ORDER — OXYBUTYNIN CHLORIDE 5 MG PO TABS
10.0000 mg | ORAL_TABLET | Freq: Every day | ORAL | Status: DC
  Administered 2017-02-27 – 2017-02-28 (×2): 10 mg via ORAL
  Filled 2017-02-27 (×2): qty 2

## 2017-02-27 MED ORDER — METFORMIN HCL ER 500 MG PO TB24: 500.0000 mg | ORAL_TABLET | Freq: Every day | ORAL | Status: DC

## 2017-02-27 MED ORDER — IPRATROPIUM-ALBUTEROL 0.5-2.5 (3) MG/3ML IN SOLN
3.0000 mL | Freq: Four times a day (QID) | RESPIRATORY_TRACT | Status: DC
  Administered 2017-02-27 – 2017-02-28 (×8): 3 mL via RESPIRATORY_TRACT
  Filled 2017-02-27 (×9): qty 3

## 2017-02-27 MED ORDER — LISINOPRIL-HYDROCHLOROTHIAZIDE 20-12.5 MG PO TABS: 1.0000 | ORAL_TABLET | Freq: Every day | ORAL | Status: DC

## 2017-02-27 MED ORDER — FLUTICASONE PROPIONATE HFA 110 MCG/ACT IN AERO: 2.0000 | INHALATION_SPRAY | Freq: Two times a day (BID) | RESPIRATORY_TRACT | Status: DC

## 2017-02-27 MED ORDER — ENOXAPARIN SODIUM 40 MG/0.4ML ~~LOC~~ SOLN
40.0000 mg | SUBCUTANEOUS | Status: DC
  Administered 2017-02-27 – 2017-02-28 (×2): 40 mg via SUBCUTANEOUS
  Filled 2017-02-27: qty 0.4

## 2017-02-27 NOTE — Progress Notes (Signed)
Patient placed on V60 via AVAPS mode so patient could take nap. VT target set to FIO2 of 40% and HIGH IPAP setting of 30 cmH20 and Low EPAP setting of 8 cmH20. Patient tolerating well at this time.

## 2017-02-27 NOTE — Progress Notes (Signed)
Pt is resting comfortably at this time. No distress or complications noted 

## 2017-02-27 NOTE — Progress Notes (Signed)
  Echocardiogram 2D Echocardiogram with definity has been performed.  Leta JunglingCooper, Jalayne Ganesh M 02/27/2017, 10:03 AM

## 2017-02-27 NOTE — Telephone Encounter (Signed)
Mother is aware of refill and made appt for 03-10-17. Jazmin Hartsell,CMA

## 2017-02-27 NOTE — Telephone Encounter (Signed)
Will refill medication for 2 months. Patient needs an appointment. Has not been seen since 03/2016. Thanks!

## 2017-02-27 NOTE — Evaluation (Signed)
Physical Therapy Evaluation Patient Details Name: [name] MRN: [id number] DOB: Sep 16, 1995 Today's Date: 02/27/2017   History of Present Illness  Pt is a 21 y/o male admitted secondary to acute respiratory failure. PMH includes prader willi syndrome, pulmonary HTN, DM, HTN, asthma, and obesity hypoventilation syndrome.   Clinical Impression  Pt admitted secondary to problem above with deficits below. PTA, pt required assist with bed mobility and bathing/dressing, however, was able to ambulate independently. Had an aide come in when mother was not available. Upon eval, pt very limited by decreased cardiopulmonary status. Upon entry, pt sats at 85% on 4L in supine. RN requesting PT to sit pt at EOB to see if oxygen sats increased and turn oxygen up to 6L. Upon sitting oxygen sats increased to >90%, however, in standing fluctuated between 88%-95% on 6L, therefore further mobility deferred. Upon return to supine, pt oxygen sats decreased to mid 70s therefore returned pt to sitting EOB, where sats increased to 93% on 6L. Notified RN and RN came in room to address. Pt required mod A +2 for bed mobility, however, min guard for basic sit<>stand transfer. Anticipate pt will progress well once cardiopulm issues resolve, and anticipate pt will be safe to d/c home once medically stable. Will continue to follow acutely to progress mobility according to pt tolerance and update recommendations as necessary.     Follow Up Recommendations Supervision/Assistance - 24 hour (follow up recs pending pt progress)    Equipment Recommendations  Other (comment) (pending pt progress)    Recommendations for Other Services       Precautions / Restrictions Precautions Precautions: Other (comment) Precaution Comments: Watch oxygen sats  Restrictions Weight Bearing Restrictions: No      Mobility  Bed Mobility Overal bed mobility: Needs Assistance Bed Mobility: Supine to Sit;Sit to Supine     Supine to sit:  Mod assist;+2 for physical assistance;HOB elevated Sit to supine: Mod assist;+2 for physical assistance;HOB elevated   General bed mobility comments: Assist for LE management and trunk elevation. Assist from PT and mom. Oxygen sats at 85% on 4L upon entry, RN came in and asked for PT to sit pt at EOB and to increase O2 to 6L. Pt's oxygen sats above 90% on 6L.    Transfers Overall transfer level: Needs assistance Equipment used: None Transfers: Sit to/from Stand Sit to Stand: Min guard         General transfer comment: Min guard for safety. Wanted to stand to use the bathroom. Oxygen sats fluctuating from 88%-95% on 4L, therefore did not test further ambulation.   Ambulation/Gait             General Gait Details: Not tested due to fluctuating oxygen sats.   Stairs            Wheelchair Mobility    Modified Rankin (Stroke Patients Only)       Balance Overall balance assessment: Needs assistance Sitting-balance support: No upper extremity supported;Feet supported Sitting balance-Leahy Scale: Good     Standing balance support: No upper extremity supported Standing balance-Leahy Scale: Fair Standing balance comment: static standing                              Pertinent Vitals/Pain Pain Assessment: No/denies pain    Home Living Family/patient expects to be discharged to:: Private residence Living Arrangements: Parent Available Help at Discharge: Family;Available 24 hours/day;Personal care attendant Type of Home: House Home Access:  Stairs to enter Entrance Stairs-Rails: Right Entrance Stairs-Number of Steps: 5 Home Layout: One level Home Equipment: Wheelchair - power;Hospital bed Additional Comments: Personal care attendant available during day when mom isn't home     Prior Function Level of Independence: Needs assistance   Gait / Transfers Assistance Needed: Independent with ambulating at home. Has power chair but aunt reports he doesn't need.  Needs help with bed mobility.   ADL's / Homemaking Assistance Needed: Needs assist with ADLs        Hand Dominance        Extremity/Trunk Assessment   Upper Extremity Assessment Upper Extremity Assessment: Overall WFL for tasks assessed    Lower Extremity Assessment Lower Extremity Assessment: Generalized weakness    Cervical / Trunk Assessment Cervical / Trunk Assessment: Normal  Communication   Communication: No difficulties  Cognition Arousal/Alertness: Awake/alert Behavior During Therapy: WFL for tasks assessed/performed Overall Cognitive Status: Within Functional Limits for tasks assessed                                        General Comments General comments (skin integrity, edema, etc.): Upon return to supine, pt's oxygen sats decreasing to 77% on 6L. Notified RN and sat pt back up at EOB and sats returned to 93% on 6L. RN present in room to wait with pt sitting EOB, until bipap applied.     Exercises General Exercises - Upper Extremity Shoulder Flexion: AROM;10 reps;Seated (with pursed lip breathing )   Assessment/Plan    PT Assessment Patient needs continued PT services  PT Problem List Decreased strength;Decreased activity tolerance;Decreased balance;Decreased mobility;Cardiopulmonary status limiting activity;Obesity       PT Treatment Interventions Gait training;Stair training;DME instruction;Functional mobility training;Therapeutic activities;Therapeutic exercise;Neuromuscular re-education;Balance training;Patient/family education    PT Goals (Current goals can be found in the Care Plan section)  Acute Rehab PT Goals Patient Stated Goal: none stated  PT Goal Formulation: With patient Time For Goal Achievement: 03/13/17 Potential to Achieve Goals: Fair    Frequency Min 3X/week   Barriers to discharge        Co-evaluation               AM-PAC PT "6 Clicks" Daily Activity  Outcome Measure Difficulty turning over in bed  (including adjusting bedclothes, sheets and blankets)?: Total Difficulty moving from lying on back to sitting on the side of the bed? : Total Difficulty sitting down on and standing up from a chair with arms (e.g., wheelchair, bedside commode, etc,.)?: Total Help needed moving to and from a bed to chair (including a wheelchair)?: A Little Help needed walking in hospital room?: A Little Help needed climbing 3-5 steps with a railing? : A Lot 6 Click Score: 11    End of Session Equipment Utilized During Treatment: Oxygen Activity Tolerance: Treatment limited secondary to medical complications (Comment) (decreased oxygen sats ) Patient left: in bed;with bed alarm set;with nursing/sitter in room;with family/visitor present (sitting EOB ) Nurse Communication: Mobility status PT Visit Diagnosis: Other abnormalities of gait and mobility (R26.89)    Time: [id number] PT Time Calculation (min) (ACUTE ONLY): 23 min   Charges:   PT Evaluation $PT Eval Moderate Complexity: 1 Procedure PT Treatments $Therapeutic Activity: 8-22 mins   PT G Codes:        Gladys DammeBrittany Gurdeep Keesey, PT, DPT  Acute Rehabilitation Services  Pager: [phone]   Lehman PromBrittany S Marquest Gunkel 02/27/2017, 7:36 PM

## 2017-02-27 NOTE — Progress Notes (Signed)
Family Medicine Teaching Service Daily Progress Note Intern Pager: [phone]  Patient name: [name] Medical record number: [id number] Date of birth: [birth year] Age: 21 y.o. Gender: male  Primary Care Provider: Mayo, Allyn KennerKaty Dodd, MD Consultants: Nutrition, PT Code Status: Full   Pt Overview and Major Events to Date:  [name] is a 21 y.o. male presenting with SOB x 1 day. Admitted to FPTS on 02/27/2017.   Assessment and Plan: Joshua Buryazhae M [name] is a 21 y.o. male presenting with SOB x 1 day. PMH is significant for Prader-Willi syndrome, obesity, obesity hypoventilation, sleep apnea, asthma, enuresis, T2DM, Raynauds phenomenon &congenital left kidney dysfunction.   Shortness of breath Patient reported to have SOB and cyanosis around lips and in fingertips. Prior to admission, patient had increased work of breathing and was leaning forward to breath. Denies cough. Patient at home is on albuterol at bedtime and on bipap nightly with history of asthma and obesity hypoventilation syndrome. Less likely pneumonia, given patient afebrile at this time and CXR showing stable cardiomegaly and central pulmonary vascular congestion with mild bibasilar edema or atelectasis noted. Though does have slight leukocytosis to 12.1. Clear breath sounds without wheezing on exam. Less likely cardiac in origin given negative istat troponin, BNP of 59.9, and EKG showing sinus tachycardia with no ST elevations. Echo from 09/2014 showed LVEF of 60-65%, and mildly increased systolic pressure in pulmonary arteries. Patient in ED was on non-rebreather sating at 93%. Patient is currently on Bipap with O2 saturation of 98%. Mother noted in ED that when patient removed O2 to use restroom, O2 sats dropped to 50s. Denies fever or chills. Denies sick contacts. ABG showing pH 7.291, pCO2 64.6, PO2 104.0, and bicarbonate 31.2, consistent with compensated respiratory acidosis suggestive of more chronic process. ABG values are similar to  prior admission when patient's bipap mask was ill-fitting. Weight about 15 lbs less than upon admission 2 years ago. Mother notes improvement after bipap. Patient is more talkative and alert and is breathing better. Patient had slight cough yesterday night but denies sputum production.  -albuterol at bedtime -continue home meds of flonase, flovent, loratadine, and montelukast -duoneb q6 -supplemental O2 to keep O2 saturation >90% -bipap at bedtime  -continuous pulse ox -routine vital signs  -am CBC and BMP -consider am ABG if patient not closer to his baseline -follows with Dr. Delton CoombesByrum; consider Pulmonology consult  -PT consult -echo to assess LVEF given abnormal chest xray findings  -TSH  Hyperkalemia K of 5.6 on admission. Current K of 5.6. May be secondary to ACEi. No EKG changes noted on admission. -continue to monitor, repeat BMP at noon -may improve with SSI -could dose once with lasix, as has trace LE edema, or kayexalate if increases  Headache Likely due to hypercarbia and hypoxia. Patient today states HA is improved and only has slight headache.  -tylenol prn -suspect will improve with improvement in respiratory status  T2DM Glucose of 139 on admission. Current glucose of 91. A1C on 03/06/2016 of 6.4. On metformin 500 mg XR at home. -sensitive SSI -qACHS CBGs -repeat A1C  Enuresis Mother reports nighttime enuresis -continue home oxybutynin at bedtime  -continue home desmopressin   HTN Current BP of 115/69 -continue home lisinopril and hydrochlorothiazide   Morbid Obesity Secondary to Prader-Willi syndrome -limit to 1200 kcal a day -Nutrition consult  FEN/GI: carb modified, limited to 1200 kcal a day  PPx: lovenox   Disposition: continue to monitor   Subjective:  Patient today is improved. Mother  notes patient is more talkative and alert, and is moving toward baseline. Patient has been breathing better since starting bipap. Patient still has slight  headache, but states decreased from yesterday. Patient had cough yesterday but denies sputum production. Patient denies pain, nausea, vomiting, diarrhea, fever, or chills.   Objective: Temp:  [98.5 F (36.9 C)-99.8 F (37.7 C)] 98.5 F (36.9 C) (07/26 0415) Pulse Rate:  [83-112] 91 (07/26 0749) Resp:  [16-45] 28 (07/26 0749) BP: (111-159)/(68-108) 141/89 (07/26 0749) SpO2:  [88 %-100 %] 93 % (07/26 0952) FiO2 (%):  [40 %] 40 % (07/26 0745) Weight:  [368 lb (166.9 kg)-374 lb 8 oz (169.9 kg)] 374 lb 8 oz (169.9 kg) (07/25 2315) Physical Exam: General: awake and alert, laying in bed, bipap on  Cardiovascular: RRR, no MRG  Respiratory: CTAB, no wheezes, rales or rhonchi Abdomen: soft, non tender, non distended, bowel sounds x4 quadrants Extremities: trace LE non pitting edema, no tenderness  Laboratory:  Recent Labs Lab 02/26/17 2202 02/27/17 0512  WBC 12.1* 9.9  HGB 11.6* 10.7*  HCT 39.0 36.8*  PLT 224 244    Recent Labs Lab 02/26/17 2202 02/27/17 0512  NA 138 140  K 5.6* 5.6*  CL 105 106  CO2 28 28  BUN 25* 26*  CREATININE 1.07 1.16  CALCIUM 8.6* 8.6*  PROT 7.6  --   BILITOT 0.7  --   ALKPHOS 114  --   ALT 23  --   AST 23  --   GLUCOSE 139* 91    ABG    Component Value Date/Time   PHART 7.291 (L) 02/27/2017 0405   PCO2ART 64.6 (H) 02/27/2017 0405   PO2ART 104.0 02/27/2017 0405   HCO3 31.2 (H) 02/27/2017 0405   TCO2 33 02/27/2017 0405   O2SAT 97.0 02/27/2017 0405     Imaging/Diagnostic Tests: Dg Chest Port 1 View  Result Date: 02/26/2017 CLINICAL DATA:  Shortness of breath, chest pain. EXAM: PORTABLE CHEST 1 VIEW COMPARISON:  Radiographs of September 17, 2014. FINDINGS: Stable cardiomegaly and central pulmonary vascular congestion. No pneumothorax or pleural effusion is noted. Mild right basilar opacities are noted concerning for edema or atelectasis. Bony thorax is unremarkable. IMPRESSION: Stable cardiomegaly and central pulmonary vascular congestion.  Mild bibasilar edema or atelectasis is noted. Electronically Signed   By: Lupita RaiderJames  Green Jr, M.D.   On: 02/26/2017 21:36     Oralia ManisAbraham, Jalisa Sacco, DO 02/27/2017, 10:59 AM PGY-1, [facility] FPTS Intern pager: [phone], text pages welcome

## 2017-02-27 NOTE — Progress Notes (Signed)
Nutrition Consult Brief Note  Received MD Consult for 1200 calorie diet for patient with Prader Willi Syndrome. Spoke with patient's mom/caregiver who reports feeding patient every 2 hours at home. She says that he receives 1000-1200 calories per day at home. Each meal is pre portioned by another family member, stored in a container, then Mom heats the meal in the microwave as needed for patient. He gets angry when he is hungry and food is unavailable.   Intervention:  Continue current CHO modified diet.   Mom will order his meals to mirror the meal plan followed at home; he usually has a meat and two vegetables for each meal.   RD to set up low calorie snacks between meals.   Joaquin CourtsKimberly Harris, RD, LDN, CNSC Pager [phone] After Hours Pager [phone]

## 2017-02-27 NOTE — Progress Notes (Signed)
Arterial blood gas drawn while patient was on BIPAP via AVAPS mode with tidal volume set at and EPAP was set at 7cm with oxygen set at 40%

## 2017-02-27 NOTE — Plan of Care (Signed)
Problem: Education: Goal: Knowledge of [facility] Education information/materials will improve Outcome: Progressing Oriented patient to room and the use of the call light. Placed low bed in room for fall precautions.

## 2017-02-27 NOTE — Progress Notes (Signed)
Placed pt on bipap due to high C02 will continue to monitor

## 2017-02-28 LAB — HEMOGLOBIN A1C
HEMOGLOBIN A1C: 6.6 % — AB (ref 4.8–5.6)
Mean Plasma Glucose: 143 mg/dL

## 2017-02-28 LAB — BASIC METABOLIC PANEL
ANION GAP: 6 (ref 5–15)
BUN: 28 mg/dL — ABNORMAL HIGH (ref 6–20)
CALCIUM: 8.7 mg/dL — AB (ref 8.9–10.3)
CO2: 31 mmol/L (ref 22–32)
Chloride: 105 mmol/L (ref 101–111)
Creatinine, Ser: 1.25 mg/dL — ABNORMAL HIGH (ref 0.61–1.24)
GFR calc Af Amer: >60 mL/min (ref >60)
Glucose, Bld: 99 mg/dL (ref 65–99)
Potassium: 6.1 mmol/L — ABNORMAL HIGH (ref 3.5–5.1)
SODIUM: 142 mmol/L (ref 135–145)

## 2017-02-28 LAB — GLUCOSE, CAPILLARY
GLUCOSE-CAPILLARY: 97 mg/dL (ref 65–99)
GLUCOSE-CAPILLARY: 92 mg/dL (ref 65–99)
GLUCOSE-CAPILLARY: 100 mg/dL — AB (ref 65–99)
Glucose-Capillary: 115 mg/dL — ABNORMAL HIGH (ref 65–99)

## 2017-02-28 NOTE — Progress Notes (Signed)
FMTS Social Note:  Called patient over the phone to see how he is doing. He did not know that I am his new PCP and our conversation was brief because he was on BiPAP. He states he is doing fine and has no concerns. I look forward to seeing him in clinic for his hospital follow-up appointment. I appreciate the care provided by Dr. Darin EngelsAbraham and rest [facility].  Willadean CarolKaty Jenaveve Fenstermaker, MD PGY-3

## 2017-02-28 NOTE — Progress Notes (Signed)
K+ 6.1 today.  MD notified.  EKG obtained per MD order.  Will continue to monitor.  Alonza Bogusuvall, Rontae Inglett Gray

## 2017-02-28 NOTE — Discharge Summary (Signed)
[facility] Discharge Summary  Patient name: [name] Medical record number: [id number] Date of birth: 05-10-1996 Age: 21 y.o. Gender: male Date of Admission: 02/26/2017  Date of Discharge:03/01/2017 Admitting Physician: Kinnie Feil, MD  Primary Care Provider: Mayo, Pete Pelt, MD Consultants: Nutrition, PT  Indication for Hospitalization: Shortness of breath   Discharge Diagnoses/Problem List:  Shortness of breath Hyperkalemia AKI Headache T2DM Enuresis HTN Morbid Obesity    Disposition: home  Discharge Condition: stable, improving  Discharge Exam:  General: awake and alert, NAD, sitting in chair playing with iPad Cardiovascular: RRR, no MRG Respiratory: CTAB, no wheezes, rales, or rhonchi  Abdomen: soft, non tender, bowel sounds x 4 quadrants  Extremities: trace LE non pitting edema, no tenderness   Brief Hospital Course:  [name] is a 21 y.o. Male presenting with SOB beginning on day of admission. PMH is significant for Prader-Willi syndrome, obesity, obesity hypoventilation, sleep apnea, asthma, enuresis, T2DM, Raynauds phenomenon &congenital left kidney dysfunction. Patient's mother reported that patient was SOB and had cyanosis around lips and fingertips. Patient at home is on albuterol at bedtime and bipap nightly. CXR on admission showed stable cardiomegaly and central pulmonary vascular congestion with mild bibasilar edema or atelectasis noted. Slight leukocytosis to 12.1 on admission. Negative istat troponin, BNP of 59.9, and EKG showing no ST elevations. At time of discharge patient was able to be weaned to 3L O2 and saturating in high 90s. Patient also became hyperkalemic during admission with Potassium max value of 6.1, improved to 5.3 on discharge. Patient found to have slight AKI, with Cr increasing to 1.25, correcting to 1.07 at discharge. Patient had headache throughout admission, likely due to respiratory status, but  improved at time of discharge.   Issues for Follow Up:  1. Follow up on respiratory status and bipap compliance  2. Follow up on K, elevation likely due to ACEi use 3. Follow up with PCP for echo results  4. Pulmonology f/u on 9/25 @ 9am  Significant Procedures: Echocardiogram   Significant Labs and Imaging:   Recent Labs Lab 02/26/17 2202 02/27/17 0512  WBC 12.1* 9.9  HGB 11.6* 10.7*  HCT 39.0 36.8*  PLT 224 244    Recent Labs Lab 02/26/17 2202 02/27/17 0512 02/27/17 1047 02/28/17 0337 03/01/17 0301  NA 138 140 140 142 140  K 5.6* 5.6* 5.7* 6.1* 5.3*  CL 105 106 106 105 104  CO2 '28 28 26 31 31  ' GLUCOSE 139* 91 115* 99 122*  BUN 25* 26* 22* 28* 29*  CREATININE 1.07 1.16 0.94 1.25* 1.07  CALCIUM 8.6* 8.6* 8.9 8.7* 8.7*  ALKPHOS 114  --   --   --   --   AST 23  --   --   --   --   ALT 23  --   --   --   --   ALBUMIN 3.0*  --   --   --   --    ABG    Component Value Date/Time   PHART 7.291 (L) 02/27/2017 0405   PCO2ART 64.6 (H) 02/27/2017 0405   PO2ART 104.0 02/27/2017 0405   HCO3 31.2 (H) 02/27/2017 0405   TCO2 33 02/27/2017 0405   O2SAT 97.0 02/27/2017 0405    Dg Chest Port 1 View  Result Date: 02/26/2017 CLINICAL DATA:  Shortness of breath, chest pain. EXAM: PORTABLE CHEST 1 VIEW COMPARISON:  Radiographs of September 17, 2014. FINDINGS: Stable cardiomegaly and central pulmonary vascular congestion. No  pneumothorax or pleural effusion is noted. Mild right basilar opacities are noted concerning for edema or atelectasis. Bony thorax is unremarkable. IMPRESSION: Stable cardiomegaly and central pulmonary vascular congestion. Mild bibasilar edema or atelectasis is noted. Electronically Signed   By: Marijo Conception, M.D.   On: 02/26/2017 21:36    Results/Tests Pending at Time of Discharge: Echo  Discharge Medications:  Allergies as of 03/01/2017   No Known Allergies     Medication List    STOP taking these medications   bacitracin ointment    carboxymethylcellulose 1 % ophthalmic solution   oseltamivir 75 MG capsule Commonly known as:  TAMIFLU     TAKE these medications   ACCU-CHEK AVIVA PLUS test strip Generic drug:  glucose blood as directed   AEROCHAMBER MV inhaler Use with albuterol inhaler   cetirizine 10 MG tablet Commonly known as:  ZYRTEC take 1 tablet by mouth once daily   desmopressin 0.2 MG tablet Commonly known as:  DDAVP Take 0.4 mg by mouth at bedtime.   fluticasone 110 MCG/ACT inhaler Commonly known as:  FLOVENT HFA Inhale 2 puffs into the lungs 2 (two) times daily.   fluticasone 50 MCG/ACT nasal spray Commonly known as:  FLONASE instill 2 sprays into each nostril once daily   ibuprofen 200 MG tablet Commonly known as:  ADVIL,MOTRIN Take 600-800 mg by mouth every 6 (six) hours as needed for mild pain.   Lancets 30G Misc 1 Device by Does not apply route daily before breakfast.   ACCU-CHEK SOFTCLIX LANCETS lancets Use as instructed   lisinopril-hydrochlorothiazide 20-12.5 MG tablet Commonly known as:  PRINZIDE,ZESTORETIC take 1 tablet by mouth once daily   metFORMIN 500 MG 24 hr tablet Commonly known as:  GLUCOPHAGE-XR take 1 tablet by mouth once daily WITH BREAKFAST   Misc. Devices Misc Please provide a pulse oximeter for the ear lobe. Patient has diagnosis of Raynaud, asthma   montelukast 10 MG tablet Commonly known as:  SINGULAIR take 1 tablet by mouth at bedtime   olopatadine 0.1 % ophthalmic solution Commonly known as:  PATANOL Place 1 drop into both eyes 2 (two) times daily as needed for allergies.   oxybutynin 5 MG tablet Commonly known as:  DITROPAN Take 1 tablet (5 mg total) by mouth at bedtime. What changed:  how much to take   PREVIDENT 5000 ENAMEL PROTECT 1.1-5 % Pste Generic drug:  Sod Fluoride-Potassium Nitrate Take 1 application by mouth 2 (two) times daily.   PROAIR HFA 108 (90 Base) MCG/ACT inhaler Generic drug:  albuterol inhale 2 puff by mouth every 6  hours if needed   albuterol (2.5 MG/3ML) 0.083% nebulizer solution Commonly known as:  PROVENTIL inhale contents of 1 vial in nebulizer once daily   Respiratory Therapy Supplies Misc BiPap, titration setting 20/15   Nebulizer/Adult Mask Kit 1 Device by Does not apply route once.       Discharge Instructions: Please refer to Patient Instructions section of EMR for full details.  Patient was counseled important signs and symptoms that should prompt return to medical care, changes in medications, dietary instructions, activity restrictions, and follow up appointments.   Follow-Up Appointments: Follow-up Information    [place]. Go on 03/04/2017.   Why:  Go to appointment at 1:30 PM. Please arrive 15 minutes early and bring medications. Contact information: [place]       Collene Gobble, MD. Go on 04/29/2017.   Specialty:  Pulmonary Disease Why:  Go to appointment at 9:00 AM. Please arrive 15 minutes early and bring medications. Contact information: [address] [place] [address] [place], [place], DO 03/02/2017, 7:01 PM PGY-1, [place]

## 2017-02-28 NOTE — Progress Notes (Signed)
Family Medicine Teaching Service Daily Progress Note Intern Pager: [phone]  Patient name: [name] Medical record number: [id number] Date of birth: 01-24-1996 Age: 21 y.o. Gender: male  Primary Care Provider: Mayo, Allyn KennerKaty Dodd, MD Consultants: Nutrition, PT Code Status: Full   Pt Overview and Major Events to Date:  [name] a 21 y.o.malepresenting with SOB x 1 day. Admitted to FPTS on 02/27/2017.   Assessment and Plan: [name] a 21 y.o.malepresenting with SOB x 1 day. PMH is significant for Prader-Willi syndrome, obesity, obesity hypoventilation, sleep apnea, asthma, enuresis, T2DM, Raynauds phenomenon &congenital left kidney dysfunction.   Shortness of breath Patient reported to have SOB and cyanosis around lips and in fingertips. Prior to admission, patient had increased work of breathing and was leaning forward to breath. Patient at home is on albuterol at bedtime and on bipap nightly withhistory of asthma and obesity hypoventilation syndrome. Less likely pneumonia, given patient afebrile at this time and CXR showing stable cardiomegaly and central pulmonary vascular congestion with mild bibasilar edema or atelectasis noted. Though does have slight leukocytosis to 12.1. Clear breath sounds without wheezing on exam.Less likely cardiac in origin given negative istat troponin, BNP of 59.9, and EKG showing sinus tachycardia with no ST elevations. Echo from 09/2014 showed LVEF of 60-65%, and mildly increased systolic pressure in pulmonary arteries. Patient is currently on 4L O2 saturation of 99%. Mother noted in ED that when patient removed O2 to use restroom, O2 sats dropped to 50s. Denies fever or chills. Denies sick contacts. ABG on 7/26 showing pH 7.291, pCO2 64.6, PO2 104.0, and bicarbonate 31.2, consistent with respiratory acidosis. ABG values are similar to prior admission when patient's bipap mask was ill-fitting. Weight about 15 lbs less than upon admission 2 years  ago. Patient is more talkative and alert and is breathing better. Patient denies SOB and cough.  -albuterol at bedtime -continue home meds of flonase, flovent, loratadine, and montelukast -duoneb q6 -supplemental O2 to keep O2 saturation >90% -bipap at bedtime  -continuous pulse ox -routine vital signs  -consider am ABG if patient not closer to his baseline -follows with Dr. Delton CoombesByrum; consider Pulmonology consult  -PT consult pending ECHO results  -echo to assess LVEF given abnormal chest xray findings   Hyperkalemia K of 5.6 on admission. Current K of 6.1. May be secondary to ACEi. No EKG changes noted. Patient denies current chest pain but mother states patient mentioned slight chest pain yesterday.  -continue to monitor, repeat BMP at noon -may improve with SSI -consider kayexalate if increases -discontinue lisinopril  -am BMP  AKI Cr of 1.25. Baseline of 1.0. -continue to monitor -consider fluid hydration if not improved  -encourage oral hydration   Headache Likely due to hypercarbia and hypoxia. Patient today states HA is improved and only has slight headache.  -tylenol prn -suspect will improve with improvement in respiratory status  T2DM Glucose of 139 on admission. Current glucose of 99. A1C of 6.6. Previous A1C on 03/06/16 of 6.4. On metformin 500 mg XR at home. -sensitive SSI -qACHS CBGs -repeat A1C  Enuresis Mother reports nighttime enuresis -continue home oxybutynin at bedtime  -continue home desmopressin   HTN Current BP of 115/69 -continue home lisinopril and hydrochlorothiazide   Morbid Obesity Secondary toPrader-Willi syndrome. Nutrition recommendations to continue modified diet, mirror meal plan to home meal plan, and have low calorie snacks between meals.  -limit to 1200 kcal a day -appreciate Nutrition recommendations   FEN/GI: carb modified, limited to 1200  kcal a day PPx: lovenox   Disposition: continue to monitor   Subjective:  Patient  states he is improving. Patient denies SOB and cough. Patient says he has a headache but ibuprofen is helping. Patient denies chest pain, but mother notes he had some chest pain yesterday. Patient denies abdominal pain but mother notes he has not had a bowel movement since admission and is usually very regular.   Objective: Temp:  [97.6 F (36.4 C)-98.7 F (37.1 C)] 97.6 F (36.4 C) (07/27 0748) Pulse Rate:  [81-101] 84 (07/27 0546) Resp:  [16-27] 23 (07/27 0546) BP: (98-135)/(41-97) 109/74 (07/27 0748) SpO2:  [93 %-100 %] 99 % (07/27 0849) FiO2 (%):  [40 %] 40 % (07/27 0500) Weight:  [355 lb (161 kg)] 355 lb (161 kg) (07/27 0500) Physical Exam: General: awake and alert, NAD, sitting on edge of bed with nasal canula in place Cardiovascular: RRR, no MRG Respiratory: CTAB, no wheezes, rales, or rhonchi  Abdomen: soft, non tender, bowel sounds x 4 quadrants  Extremities: trace LE non pitting edema, no tenderness   Laboratory:  Recent Labs Lab 02/26/17 2202 02/27/17 0512  WBC 12.1* 9.9  HGB 11.6* 10.7*  HCT 39.0 36.8*  PLT 224 244    Recent Labs Lab 02/26/17 2202 02/27/17 0512 02/27/17 1047 02/28/17 0337  NA 138 140 140 142  K 5.6* 5.6* 5.7* 6.1*  CL 105 106 106 105  CO2 28 28 26 31   BUN 25* 26* 22* 28*  CREATININE 1.07 1.16 0.94 1.25*  CALCIUM 8.6* 8.6* 8.9 8.7*  PROT 7.6  --   --   --   BILITOT 0.7  --   --   --   ALKPHOS 114  --   --   --   ALT 23  --   --   --   AST 23  --   --   --   GLUCOSE 139* 91 115* 99    ABG    Component Value Date/Time   PHART 7.291 (L) 02/27/2017 0405   PCO2ART 64.6 (H) 02/27/2017 0405   PO2ART 104.0 02/27/2017 0405   HCO3 31.2 (H) 02/27/2017 0405   TCO2 33 02/27/2017 0405   O2SAT 97.0 02/27/2017 0405     Imaging/Diagnostic Tests: Dg Chest Port 1 View  Result Date: 02/26/2017 CLINICAL DATA:  Shortness of breath, chest pain. EXAM: PORTABLE CHEST 1 VIEW COMPARISON:  Radiographs of September 17, 2014. FINDINGS: Stable  cardiomegaly and central pulmonary vascular congestion. No pneumothorax or pleural effusion is noted. Mild right basilar opacities are noted concerning for edema or atelectasis. Bony thorax is unremarkable. IMPRESSION: Stable cardiomegaly and central pulmonary vascular congestion. Mild bibasilar edema or atelectasis is noted. Electronically Signed   By: Lupita RaiderJames  Green Jr, M.D.   On: 02/26/2017 21:36     Oralia ManisAbraham, Zen Cedillos, DO 02/28/2017, 10:43 AM PGY-1, [facility] FPTS Intern pager: [phone], text pages welcome

## 2017-03-01 LAB — BASIC METABOLIC PANEL
ANION GAP: 5 (ref 5–15)
BUN: 29 mg/dL — ABNORMAL HIGH (ref 6–20)
CHLORIDE: 104 mmol/L (ref 101–111)
CO2: 31 mmol/L (ref 22–32)
CREATININE: 1.07 mg/dL (ref 0.61–1.24)
Calcium: 8.7 mg/dL — ABNORMAL LOW (ref 8.9–10.3)
Glucose, Bld: 122 mg/dL — ABNORMAL HIGH (ref 65–99)
Potassium: 5.3 mmol/L — ABNORMAL HIGH (ref 3.5–5.1)
Sodium: 140 mmol/L (ref 135–145)

## 2017-03-01 LAB — GLUCOSE, CAPILLARY
GLUCOSE-CAPILLARY: 89 mg/dL (ref 65–99)
GLUCOSE-CAPILLARY: 85 mg/dL (ref 65–99)
Glucose-Capillary: 87 mg/dL (ref 65–99)

## 2017-03-01 MED ORDER — IPRATROPIUM-ALBUTEROL 0.5-2.5 (3) MG/3ML IN SOLN
3.0000 mL | Freq: Three times a day (TID) | RESPIRATORY_TRACT | Status: DC
  Administered 2017-03-01 (×2): 3 mL via RESPIRATORY_TRACT
  Filled 2017-03-01 (×2): qty 3

## 2017-03-01 NOTE — Discharge Instructions (Signed)
I have arranged for hospital follow-up on 7/31 at 1:30 PM. We will address medications and the results of the echocardiogram. [name] will see pulmonology on 9/25 at 9:00 AM.

## 2017-03-01 NOTE — Plan of Care (Signed)
Problem: Safety: Goal: Ability to remain free from injury will improve Outcome: Progressing Patient has remained free of falls during admission. Call bell within reach. Bed in low and locked position. Patient alert. Family at bedside. Clean and clear environment maintained. Nonskid footwear being utilized. 3/4 siderails in place. Patient on low bed. Patient verbalized understanding of safety instruction.  Problem: Pain Managment: Goal: General experience of comfort will improve Outcome: Progressing Patient denies pain. No facial grimacing or moaning. Vital signs are stable.

## 2017-03-01 NOTE — Progress Notes (Signed)
Family Medicine Teaching Service Daily Progress Note Intern Pager: [phone]  Patient name: [name] Medical record number: [id number] Date of birth: [birth year] Age: 21 y.o. Gender: male  Primary Care Provider: Mayo, Allyn KennerKaty Dodd, MD Consultants: Nutrition, PT Code Status: Full   Pt Overview and Major Events to Date:  Joshua Circleazhae M Roachis a 21 y.o.malepresenting with SOB x 1 day. Admitted to FPTS on 02/27/2017.   Assessment and Plan: Joshua Circleazhae M Roachis a 21 y.o.malepresenting with SOB x 1 day. PMH is significant for Prader-Willi syndrome, obesity, obesity hypoventilation, sleep apnea, asthma, enuresis, T2DM, Raynauds phenomenon &congenital left kidney dysfunction.   Shortness of breath Patient reported to have SOB and cyanosis around lips and in fingertips. Prior to admission, patient had increased work of breathing and was leaning forward to breath. Patient at home is on albuterol at bedtime and on bipap nightly withhistory of asthma and obesity hypoventilation syndrome. Less likely pneumonia, given patient afebrile at this time and CXR showing stable cardiomegaly and central pulmonary vascular congestion with mild bibasilar edema or atelectasis noted. Though does have slight leukocytosis to 12.1. Clear breath sounds without wheezing on exam.Less likely cardiac in origin given negative istat troponin, BNP of 59.9, and EKG showing sinus tachycardia with no ST elevations. Echo from 09/2014 showed LVEF of 60-65%, and mildly increased systolic pressure in pulmonary arteries. Patient is currently on 4L O2 saturation of 99%. Mother noted in ED that when patient removed O2 to use restroom, O2 sats dropped to 50s. Denies fever or chills. Denies sick contacts. ABG on 7/26 showing pH 7.291, pCO2 64.6, PO2 104.0, and bicarbonate 31.2, consistent with respiratory acidosis. ABG values are similar to prior admission when patient's bipap mask was ill-fitting. Weight about 15 lbs less than upon admission 2 years  ago. Patient is more talkative and alert and is breathing better. Patient denies SOB and cough.  -albuterol at bedtime -continue home meds of flonase, flovent, loratadine, and montelukast -duoneb q6 -supplemental O2 to keep O2 saturation >90% -bipap at bedtime  -continuous pulse ox -routine vital signs  -follows with Dr. Delton CoombesByrum; has appointment with him in August -echo to assess LVEF given abnormal chest xray findings; done on Thursday, not read yet  Hyperkalemia K of 5.6 on admission, 5.3 on 7/28.  May be secondary to ACEi. No EKG changes noted. Patient denies current chest pain but mother states patient mentioned slight chest pain yesterday.  -continue to hold lisinopril   AKI Cr of 1.07, down from 1.25 on 7/27. Baseline of 1.0. -continue to monitor -encourage oral hydration  Headache Likely due to hypercarbia and hypoxia. Patient today states HA is improved and only has slight headache.  -tylenol prn -suspect will improve with improvement in respiratory status  T2DM Glucose of 139 on admission. Current glucose of 99. A1C of 6.6. Previous A1C on 03/06/16 of 6.4. On metformin 500 mg XR at home. -sensitive SSI -qACHS CBGs  Enuresis Mother reports nighttime enuresis -continue home oxybutynin at bedtime  -continue home desmopressin   HTN Current BP of 116/77 -continue home hydrochlorothiazide   Morbid Obesity Secondary toPrader-Willi syndrome. Nutrition recommendations to continue modified diet, mirror meal plan to home meal plan, and have low calorie snacks between meals.  Limited utility of inpatient calorie restriction since his stay will be short. -appreciate Nutrition recommendations   FEN/GI: carb modified, calorie level 1600-2000 PPx: lovenox   Disposition: home today with outpatient Echo read and pulmonology follow up  Subjective:  Patient states he is improving. Patient denies  SOB, cough, and headache.  His mother also says that her son is about at his  baseline.  She is frustrated that the echo has not been read yet, but I assured her that we will get the results to her after discharge so that she will know whether to start Lasix or not.  Objective: Temp:  [97.8 F (36.6 C)-98.8 F (37.1 C)] 98.8 F (37.1 C) (07/28 0822) Pulse Rate:  [80-107] 94 (07/28 0822) Resp:  [13-27] 13 (07/28 0425) BP: (113-134)/(58-93) 116/77 (07/28 0822) SpO2:  [98 %-100 %] 98 % (07/28 0840) FiO2 (%):  [40 %] 40 % (07/28 0425) Weight:  [348 lb (157.9 kg)] 348 lb (157.9 kg) (07/28 0425) Physical Exam: General: awake and alert, NAD, sitting in chair playing with iPad Cardiovascular: RRR, no MRG Respiratory: CTAB, no wheezes, rales, or rhonchi  Abdomen: soft, non tender, bowel sounds x 4 quadrants  Extremities: trace LE non pitting edema, no tenderness   Laboratory:  Recent Labs Lab 02/26/17 2202 02/27/17 0512  WBC 12.1* 9.9  HGB 11.6* 10.7*  HCT 39.0 36.8*  PLT 224 244    Recent Labs Lab 02/26/17 2202  02/27/17 1047 02/28/17 0337 03/01/17 0301  NA 138  < > 140 142 140  K 5.6*  < > 5.7* 6.1* 5.3*  CL 105  < > 106 105 104  CO2 28  < > 26 31 31   BUN 25*  < > 22* 28* 29*  CREATININE 1.07  < > 0.94 1.25* 1.07  CALCIUM 8.6*  < > 8.9 8.7* 8.7*  PROT 7.6  --   --   --   --   BILITOT 0.7  --   --   --   --   ALKPHOS 114  --   --   --   --   ALT 23  --   --   --   --   AST 23  --   --   --   --   GLUCOSE 139*  < > 115* 99 122*  < > = values in this interval not displayed.  ABG    Component Value Date/Time   PHART 7.291 (L) 02/27/2017 0405   PCO2ART 64.6 (H) 02/27/2017 0405   PO2ART 104.0 02/27/2017 0405   HCO3 31.2 (H) 02/27/2017 0405   TCO2 33 02/27/2017 0405   O2SAT 97.0 02/27/2017 0405     Imaging/Diagnostic Tests: Dg Chest Port 1 View  Result Date: 02/26/2017 CLINICAL DATA:  Shortness of breath, chest pain. EXAM: PORTABLE CHEST 1 VIEW COMPARISON:  Radiographs of September 17, 2014. FINDINGS: Stable cardiomegaly and central  pulmonary vascular congestion. No pneumothorax or pleural effusion is noted. Mild right basilar opacities are noted concerning for edema or atelectasis. Bony thorax is unremarkable. IMPRESSION: Stable cardiomegaly and central pulmonary vascular congestion. Mild bibasilar edema or atelectasis is noted. Electronically Signed   By: Lupita RaiderJames  Green Jr, M.D.   On: 02/26/2017 21:36     Lennox SoldersWinfrey, Jisele Price C, MD 03/01/2017, 9:25 AM PGY-1, [facility] FPTS Intern pager: [phone], text pages welcome

## 2017-03-03 LAB — ECHOCARDIOGRAM COMPLETE
Height: 54 in
WEIGHTICAEL: 5992 [oz_av]

## 2017-03-04 ENCOUNTER — Ambulatory Visit (INDEPENDENT_AMBULATORY_CARE_PROVIDER_SITE_OTHER): Payer: Medicaid Other | Admitting: Student

## 2017-03-04 ENCOUNTER — Encounter: Payer: Self-pay | Admitting: Student

## 2017-03-04 VITALS — BP 124/82 | HR 104 | Temp 98.4°F | Ht <= 58 in | Wt 368.8 lb

## 2017-03-04 DIAGNOSIS — R05 Cough: Secondary | ICD-10-CM

## 2017-03-04 DIAGNOSIS — I10 Essential (primary) hypertension: Secondary | ICD-10-CM

## 2017-03-04 DIAGNOSIS — R0602 Shortness of breath: Secondary | ICD-10-CM

## 2017-03-04 DIAGNOSIS — R059 Cough, unspecified: Secondary | ICD-10-CM

## 2017-03-04 DIAGNOSIS — E875 Hyperkalemia: Secondary | ICD-10-CM

## 2017-03-04 DIAGNOSIS — R0609 Other forms of dyspnea: Secondary | ICD-10-CM

## 2017-03-04 NOTE — Patient Instructions (Addendum)
It was great seeing you today! We have addressed the following issues today 1. Shortness of breath: Use oxygen only if his oxygen saturation is less than 92%. Keep your appointment with pulmonologist 2.  Cough: Use his allergy medicine (Zyrtec and Flonase). Uses Flonase as we discussed in the clinic. 3.  Keep your apt with your PCP   If we did any lab work today, and the results require attention, either me or my nurse will get in touch with you. If everything is normal, you will get a letter in mail and a message via . If you don't hear from us in two weeks, please give us a call. Otherwise, we look forward to seeing you again at your next visit. If you have any questions or concerns before then, please call the clinic at [phone].  Please bring all your medications to every doctors visit  Sign up for My Chart to have easy access to your labs results, and communication with your Primary care physician.    Please check-out at the front desk before leaving the clinic.    Take Care,   Dr. Alanda SlimGonfa

## 2017-03-04 NOTE — Progress Notes (Signed)
Subjective:    [name] is a 21 y.o. old male here for hospital follow up on shortness of breath.  HPI Shortness of breath: patient was hospitalized 02/26/2017-03/01/2017 for shortness of breath. Per review of his discharge summary and medical records, the cause of his dyspnea was quite unclear. There is a concern about pulmonary vascular congestion on his x-ray. However, his echocardiogram was unremarkable. Dyspnea improved without diuretics. He weighed 374 lbs on admission and 348 lbs on discharge. He was not diuresed. Today, he weighs 368 lbs. Per mother's report, his dry weight is about 365 pounds. Per his discharge summary, patient was discharged on 3 L by nasal cannula. However, his mother states that he was discharged on room air and desaturated in mid 80s at home and that she had to put him back on 1-1.5 L by nasal cannula. With that, he was able to maintain saturation in the 90s. Per admission H&P, he uses 4L as needed. His mother reports good compliance with nightly BiPAP.  Her concern today is his cough. Cough is productive. She says she didn't look at it. His has been going on for 2 days now. Cough is worse in the morning. He also admits to intermittent sneezing and some nasal discharge but denies fever, shortness of breath, chest pain or leg swelling. He has history of seasonal allergy. He is on Zyrtec and Flonase nasal spray.  Patient has an upcoming appointment with pulmonology in about 2 months. Mother is aware of this.  Hyperkalemia: Patient had hyperkalemia to 6.2 at his recent hospitalization. His potassium was down to 5.2 on discharge. He is still on lisinopril/HCTZ for hypertension.   PMH/Problem List: has OBESITY, MORBID; VISUAL IMPAIRMENT; Essential hypertension, benign; RAYNAUD'S SYNDROME; Asthma; ORTHOSTATIC PROTEINURIA; Prader-Willi syndrome; SYMPTOM, ENURESIS, NOCTURNAL; Obesity hypoventilation syndrome (HCC); Allergic rhinitis; Intertrigo; Respiratory failure (HCC); Dyspnea;  Hypoventilation syndrome; Hypoxia; SOB (shortness of breath); Shortness of breath; Diabetes mellitus type 2 in obese (HCC); Tremor bilateral hands; Accidental overdose; Accidental lisinopril ingestion; Conjunctivitis; Acute respiratory failure (HCC); Diastolic congestive heart failure (HCC); Type 2 diabetes mellitus without complication, without long-term current use of insulin (HCC); Hyperkalemia; and Cough on his problem list.   has a past medical history of Asthma; Diabetes mellitus without complication (HCC); Hypertension; Kidney dysfunction; Obesity; Obesity hypoventilation syndrome (HCC); Prader-Willi syndrome; Raynaud's phenomenon; and Sleep apnea.  FH:  No family history on file.  SH Social History  Substance Use Topics  . Smoking status: Never Smoker  . Smokeless tobacco: Never Used  . Alcohol use No    Review of Systems Review of systems negative except for pertinent positives and negatives in history of present illness above.     Objective:     Vitals:   03/04/17 1339  BP: 124/82  Pulse: (!) 104  Temp: 98.4 F (36.9 C)  TempSrc: Oral  SpO2: 96%  Weight: (!) 368 lb 12.8 oz (167.3 kg)  Height: 4\' 10"  (1.473 m)    Physical Exam GEN: morbidly obese, appears well, no apparent distress. Head: normocephalic and atraumatic  Nares: [place] in place. No apparent rhinorrhea or congestion. Eyes: conjunctiva without injection, sclera anicteric Oropharynx: mmm without erythema or exudation CVS: RRR, nl S1&S2, no murmurs, no edema RESP: no IWOB, good air movement bilaterally, CTAB GI: limited exam but BS present & normal, soft, NTND MSK: no focal tenderness or notable swelling SKIN: no apparent skin lesion NEURO: alert and oiented appropriately, no gross deficits  PSYCH: euthymic mood with congruent affect    Assessment and  Plan:  Dyspnea Stable. Likely obesity hypoventilation syndrome vs CHF. He also has history of asthma. Patient weighed 374 lbs on admission. Weight dropped  to 355 lbs then 348 lbs on discharge without diuretics. Mother thinks his dry weight is about 365 lbs. His weight today is 368 lbs. With his clinical picture, it is hard to make sense of his weight trend obtained on different scales. His echocardiogram is within normal limits and I have discussed this with patient and patient's mother. Cardiopulmonary exam within normal limits although limited due to body habitus. He is maintaining saturation in the mid 90s on 1-1.5 L by nasal cannula. Oxygen saturation is 96% today. They report good compliance with nightly BiPAP. I suggested using oxygen to keep saturation greater than 92%. I don't he needs loops diuretics at this time. He is on albuterol for his asthma. Advised them to use it as needed not daily. He has an upcoming appointment with his PCP in 6 days and pulmonologist in about two months.  Cough Likely from postnasal drip in the setting of nasal cannula and history of seasonal allergies. He denies heartburn or reflux. We discussed about the appropriate use of Flonase. Can continue using this and Zyrtec daily. He has no dyspnea, chest pain or fever to suggest pneumonia or infectious etiology at this time.  Hyperkalemia Will check BMP today.  Orders Placed This Encounter  Procedures  . Basic metabolic panel   Almon Herculesaye T Khush Pasion, MD 03/05/17 Pager: [phone]

## 2017-03-05 ENCOUNTER — Other Ambulatory Visit: Payer: Self-pay | Admitting: Student

## 2017-03-05 ENCOUNTER — Ambulatory Visit (HOSPITAL_COMMUNITY)
Admission: RE | Admit: 2017-03-05 | Discharge: 2017-03-05 | Disposition: A | Discharge status: Home / Self Care | Payer: Medicaid Other | Source: Ambulatory Visit | Attending: Family Medicine | Admitting: Family Medicine

## 2017-03-05 ENCOUNTER — Ambulatory Visit: Payer: Medicaid Other | Admitting: *Deleted

## 2017-03-05 ENCOUNTER — Other Ambulatory Visit: Payer: Self-pay | Admitting: Family Medicine

## 2017-03-05 VITALS — Ht <= 58 in | Wt 368.0 lb

## 2017-03-05 DIAGNOSIS — R059 Cough, unspecified: Secondary | ICD-10-CM | POA: Insufficient documentation

## 2017-03-05 DIAGNOSIS — E875 Hyperkalemia: Secondary | ICD-10-CM | POA: Diagnosis present

## 2017-03-05 DIAGNOSIS — R05 Cough: Secondary | ICD-10-CM | POA: Insufficient documentation

## 2017-03-05 LAB — BASIC METABOLIC PANEL
BUN / CREAT RATIO: 31 — AB (ref 9–20)
BUN: 33 mg/dL — ABNORMAL HIGH (ref 6–20)
CALCIUM: 9.3 mg/dL (ref 8.7–10.2)
CHLORIDE: 96 mmol/L (ref 96–106)
CO2: 25 mmol/L (ref 20–29)
CREATININE: 1.06 mg/dL (ref 0.76–1.27)
GFR calc Af Amer: 114 mL/min/{1.73_m2} (ref >59)
GFR calc non Af Amer: 98 mL/min/{1.73_m2} (ref >59)
Glucose: 91 mg/dL (ref 65–99)
Potassium: 7.2 mmol/L (ref 3.5–5.2)
Sodium: 139 mmol/L (ref 134–144)

## 2017-03-05 NOTE — Assessment & Plan Note (Addendum)
Stable. Likely obesity hypoventilation syndrome vs CHF. He also has history of asthma. Patient weighed 374 lbs on admission. Weight dropped to 355 lbs then 348 lbs on discharge without diuretics. Mother thinks his dry weight is about 365 lbs. His weight today is 368 lbs. With his clinical picture, it is hard to make sense of his weight trend obtained on different scales. His echocardiogram is within normal limits and I have discussed this with patient and patient's mother. Cardiopulmonary exam within normal limits although limited due to body habitus. He is maintaining saturation in the mid 90s on 1-1.5 L by nasal cannula. Oxygen saturation is 96% today. They report good compliance with nightly BiPAP. I suggested using oxygen to keep saturation greater than 92%. I don't he needs loops diuretics at this time. He is on albuterol for his asthma. Advised them to use it as needed not daily. He has an upcoming appointment with his PCP in 6 days and pulmonologist in about two months.

## 2017-03-05 NOTE — Progress Notes (Signed)
K elevated to >7.2. Hemolyzed sample. Called and advised patient's mother to bring him in for repeat lab draw. She says she will be here about at 3 pm this afternoon.

## 2017-03-05 NOTE — Progress Notes (Signed)
   Patient brought into nurse clinic this afternoon to have a repeat EKG and blood work.  EKG completed per Dr. Sundra AlandGonfa's order. Dr. Alanda SlimGonfa is aware of EkG results amd patient is ok to go home with mom.  Dr. Alanda SlimGonfa will call with results of blood work.  Clovis PuMartin, Tamika L, RN

## 2017-03-05 NOTE — Assessment & Plan Note (Signed)
Will check BMP today. 

## 2017-03-05 NOTE — Assessment & Plan Note (Signed)
Likely from postnasal drip in the setting of nasal cannula and history of seasonal allergies. He denies heartburn or reflux. We discussed about the appropriate use of Flonase. Can continue using this and Zyrtec daily. He has no dyspnea, chest pain or fever to suggest pneumonia or infectious etiology at this time.

## 2017-03-06 ENCOUNTER — Telehealth: Payer: Self-pay | Admitting: Student

## 2017-03-06 DIAGNOSIS — I10 Essential (primary) hypertension: Secondary | ICD-10-CM

## 2017-03-06 LAB — BASIC METABOLIC PANEL
BUN / CREAT RATIO: 35 — AB (ref 9–20)
BUN: 38 mg/dL — ABNORMAL HIGH (ref 6–20)
CO2: 32 mmol/L — ABNORMAL HIGH (ref 20–29)
CREATININE: 1.09 mg/dL (ref 0.76–1.27)
Calcium: 9.4 mg/dL (ref 8.7–10.2)
Chloride: 95 mmol/L — ABNORMAL LOW (ref 96–106)
GFR, EST AFRICAN AMERICAN: 110 mL/min/{1.73_m2} (ref >59)
GFR, EST NON AFRICAN AMERICAN: 95 mL/min/{1.73_m2} (ref >59)
Glucose: 125 mg/dL — ABNORMAL HIGH (ref 65–99)
Potassium: 5.6 mmol/L — ABNORMAL HIGH (ref 3.5–5.2)
SODIUM: 135 mmol/L (ref 134–144)

## 2017-03-06 MED ORDER — HYDROCHLOROTHIAZIDE 25 MG PO TABS: 25.0000 mg | ORAL_TABLET | Freq: Every day | ORAL | 3 refills | Status: DC

## 2017-03-06 NOTE — Telephone Encounter (Signed)
Attempted to call patient's mother multiple times to discuss about blood test (elevated K to 5.6). She didn't pick up the phone. Patient should come off his lisinopril and be on HCTZ. I will try to call again and discuss this.

## 2017-03-07 ENCOUNTER — Encounter: Payer: Self-pay | Admitting: Student

## 2017-03-07 NOTE — Progress Notes (Signed)
Called and discussed patient's elevated K with the mother. Changed his lisinopril/HCTZ 20/12.5 mg to HCTZ 25 daily. Patient has a follow up apt with PCP in three days. Will forward this and his office note to PCP.

## 2017-03-10 ENCOUNTER — Encounter: Payer: Self-pay | Admitting: Internal Medicine

## 2017-03-10 ENCOUNTER — Ambulatory Visit (INDEPENDENT_AMBULATORY_CARE_PROVIDER_SITE_OTHER): Payer: Medicaid Other | Admitting: Internal Medicine

## 2017-03-10 VITALS — BP 126/81 | HR 79 | Temp 98.1°F | Wt 362.0 lb

## 2017-03-10 DIAGNOSIS — E1169 Type 2 diabetes mellitus with other specified complication: Secondary | ICD-10-CM | POA: Diagnosis not present

## 2017-03-10 DIAGNOSIS — I10 Essential (primary) hypertension: Secondary | ICD-10-CM

## 2017-03-10 DIAGNOSIS — E119 Type 2 diabetes mellitus without complications: Secondary | ICD-10-CM

## 2017-03-10 DIAGNOSIS — J9611 Chronic respiratory failure with hypoxia: Secondary | ICD-10-CM

## 2017-03-10 DIAGNOSIS — E875 Hyperkalemia: Secondary | ICD-10-CM

## 2017-03-10 DIAGNOSIS — E669 Obesity, unspecified: Secondary | ICD-10-CM

## 2017-03-10 NOTE — Assessment & Plan Note (Signed)
Stable. Due to OHS. On 1L O2 at home prn. O2 sat 92%. - Continue O2 prn - Discussed signs of increased work of breathing - Follow-up as needed

## 2017-03-10 NOTE — Patient Instructions (Signed)
It was so wonderful to meet you!  I have put in an order for you guys to come back in the next 2-3 weeks to have his potassium rechecked. Depending on what that potassium is, we will likely restart his Lisinopril at a lower dose.  Please come back to see us in 3 months!  -Dr. Nancy MarusMayo

## 2017-03-10 NOTE — Assessment & Plan Note (Signed)
K elevated up to 7.2 (hemolyzed), then 5.6%. Lisinopril stopped 03/06/17. - Future order placed for BMP  - Will likely add Lisinopril back at a low dose after BMP completed

## 2017-03-10 NOTE — Assessment & Plan Note (Signed)
Well-controlled. A1c 6.6%.  - Continue Metformin 500mg  daily - Continue checking CBGs 2 times per week. - Urine microalbumin checked today - Diabetic foot exam performed today - Return in 3 months for recheck of A1c

## 2017-03-10 NOTE — Progress Notes (Signed)
   [name] Phone: [phone]  Subjective:  [name] is a 21 year old male presenting to clinic for follow-up of diabetes, HTN, hyperkalemia.  T2DM: Taking Metformin 500mg  daily. No side effects. Checking blood sugars twice per week, because they are always ranging in the 70s-90s so mom does not feel she needs to check them more often. No lows.  HTN: Previously taking Lisinopril-HCTZ, but switched to just HCTZ for hyperkalemia. Not checking BPs at home. No side effects. Urinating like normal. No chest pain. Weight down 6lbs since last visit.  Hyperkalemia: Potassium peaked at 6.2 during recent hospitalization, then decreased to 5.2 on discharge. Repeat K at hospital follow-up visit was 7.3, but was hemolyzed. K repeated the next day and was 5.6. Patient was taking Lisinopril-HCTZ, but Lisinopril was stopped and HCTZ was increased from 12.5mg  to 25mg . No muscle spasms, no palpitations.  Chronic Respiratory Failure: Was discharged from the hospital on oxygen. Mom has been checking sats at home. When they first left the hospital, they were around 87%. Mom has had him on 1L O2. States his O2 sats are in the 90s at rest, but sometimes drop to the high 80s with ambulation. She has been putting oxygen on him whenever he is walking around the house, but takes it off at rest.  ROS: See HPI for pertinent positives and negatives  Past Medical History- diastolic CHF, HTN, T2DM, OHS, obesity, Prader-Willi syndrome  Family history reviewed for today's visit. No changes.  Social history- patient is a never smoker  Objective: BP 126/81   Pulse 79   Temp 98.1 F (36.7 C) (Oral)   Wt (!) 362 lb (164.2 kg)   SpO2 92% Comment: 1L of 02  BMI 75.66 kg/m  Gen: NAD, alert, cooperative with exam HEENT: NCAT, EOMI, MMM Neck: FROM, supple CV: RRR, no murmur Resp: Normal work of breathing. Moderately decreased air movement throughout, but lungs clear. Msk: Non-pitting edema to  the mid shin bilaterally. Diabetic foot exam: No deformities, no ulcerations, no other skin breakdown bilaterally; sensation intact to touch and monofilament testing bilaterally; PT and DP pulses intact bilaterally. Neuro: Alert and oriented, no gross deficits Skin: No rashes, no lesions, skin on feet is moderately dry. Psych: Appropriate behavior  Assessment/Plan: T2DM: Well-controlled. A1c 6.6%.  - Continue Metformin 500mg  daily - Continue checking CBGs 2 times per week. - Urine microalbumin checked today - Diabetic foot exam performed today - Return in 3 months for recheck of A1c  HTN: Well-controlled. BP 126/81 in clinic today. Lisinopril stopped at last visit for hyperkalemia. - Continue HCTZ 25mg  daily - Future order placed for BMP- Mom did not want to have this done today - Will likely restart Lisinopril at a lower dose after BMP performed - Follow-up in 3 months  Hyperkalemia: K elevated up to 7.2 (hemolyzed), then 5.6%. Lisinopril stopped 03/06/17. - Future order placed for BMP  - Will likely add Lisinopril back at a low dose after BMP completed  Chronic Respiratory Failure: Stable. Due to OHS. On 1L O2 at home prn. O2 sat 92%. - Continue O2 prn - Discussed signs of increased work of breathing - Follow-up as needed  [name], MD PGY-3

## 2017-03-10 NOTE — Assessment & Plan Note (Signed)
Well-controlled. BP 126/81 in clinic today. Lisinopril stopped at last visit for hyperkalemia. - Continue HCTZ 25mg  daily - Future order placed for BMP- Mom did not want to have this done today - Will likely restart Lisinopril at a lower dose after BMP performed - Follow-up in 3 months

## 2017-03-11 LAB — MICROALBUMIN / CREATININE URINE RATIO
Creatinine, Urine: 53.1 mg/dL
MICROALBUM., U, RANDOM: 731.9 ug/mL
Microalb/Creat Ratio: 1378.3 mg/g creat — ABNORMAL HIGH (ref 0.0–30.0)

## 2017-03-27 ENCOUNTER — Other Ambulatory Visit: Payer: Self-pay | Admitting: *Deleted

## 2017-03-27 MED ORDER — CETIRIZINE HCL 10 MG PO TABS: 10.0000 mg | ORAL_TABLET | Freq: Every day | ORAL | 1 refills | Status: DC

## 2017-04-16 ENCOUNTER — Emergency Department (HOSPITAL_COMMUNITY): Payer: Medicaid Other

## 2017-04-16 ENCOUNTER — Emergency Department (HOSPITAL_COMMUNITY)
Admission: EM | Admit: 2017-04-16 | Discharge: 2017-04-16 | Disposition: A | Discharge status: Home / Self Care | Payer: Medicaid Other | Attending: Emergency Medicine | Admitting: Emergency Medicine

## 2017-04-16 ENCOUNTER — Encounter (HOSPITAL_COMMUNITY): Payer: Self-pay | Admitting: Emergency Medicine

## 2017-04-16 ENCOUNTER — Other Ambulatory Visit: Payer: Self-pay | Admitting: Internal Medicine

## 2017-04-16 DIAGNOSIS — S0993XA Unspecified injury of face, initial encounter: Secondary | ICD-10-CM | POA: Diagnosis present

## 2017-04-16 DIAGNOSIS — Y939 Activity, unspecified: Secondary | ICD-10-CM | POA: Diagnosis not present

## 2017-04-16 DIAGNOSIS — I110 Hypertensive heart disease with heart failure: Secondary | ICD-10-CM | POA: Diagnosis not present

## 2017-04-16 DIAGNOSIS — Y999 Unspecified external cause status: Secondary | ICD-10-CM | POA: Diagnosis not present

## 2017-04-16 DIAGNOSIS — Z79899 Other long term (current) drug therapy: Secondary | ICD-10-CM | POA: Diagnosis not present

## 2017-04-16 DIAGNOSIS — E119 Type 2 diabetes mellitus without complications: Secondary | ICD-10-CM | POA: Insufficient documentation

## 2017-04-16 DIAGNOSIS — Y929 Unspecified place or not applicable: Secondary | ICD-10-CM | POA: Insufficient documentation

## 2017-04-16 DIAGNOSIS — J45909 Unspecified asthma, uncomplicated: Secondary | ICD-10-CM | POA: Insufficient documentation

## 2017-04-16 DIAGNOSIS — W0110XA Fall on same level from slipping, tripping and stumbling with subsequent striking against unspecified object, initial encounter: Secondary | ICD-10-CM | POA: Insufficient documentation

## 2017-04-16 DIAGNOSIS — Z7984 Long term (current) use of oral hypoglycemic drugs: Secondary | ICD-10-CM | POA: Insufficient documentation

## 2017-04-16 DIAGNOSIS — S01512A Laceration without foreign body of oral cavity, initial encounter: Secondary | ICD-10-CM | POA: Diagnosis not present

## 2017-04-16 DIAGNOSIS — I5030 Unspecified diastolic (congestive) heart failure: Secondary | ICD-10-CM | POA: Insufficient documentation

## 2017-04-16 MED ORDER — HYDROCODONE-ACETAMINOPHEN 7.5-325 MG/15ML PO SOLN: 15.0000 mL | Freq: Four times a day (QID) | ORAL | 0 refills | Status: DC | PRN

## 2017-04-16 MED ORDER — FLUTICASONE PROPIONATE HFA 110 MCG/ACT IN AERO: 2.0000 | INHALATION_SPRAY | Freq: Two times a day (BID) | RESPIRATORY_TRACT | 12 refills | Status: DC

## 2017-04-16 MED ORDER — ALBUTEROL SULFATE (2.5 MG/3ML) 0.083% IN NEBU: INHALATION_SOLUTION | RESPIRATORY_TRACT | 12 refills | Status: DC

## 2017-04-16 MED ORDER — MONTELUKAST SODIUM 10 MG PO TABS: 10.0000 mg | ORAL_TABLET | Freq: Every day | ORAL | 3 refills | Status: DC

## 2017-04-16 MED ORDER — AMOXICILLIN-POT CLAVULANATE 400-57 MG/5ML PO SUSR: ORAL | 0 refills | Status: DC

## 2017-04-16 MED ORDER — "THROMBI-PAD 3""X3"" EX PADS"
1.0000 | MEDICATED_PAD | Freq: Once | CUTANEOUS | Status: DC
  Filled 2017-04-16: qty 1

## 2017-04-16 MED ORDER — AMINOCAPROIC ACID SOLUTION 5% (50 MG/ML)
10.0000 mL | ORAL | Status: DC
  Administered 2017-04-16: 10 mL via ORAL
  Filled 2017-04-16: qty 100

## 2017-04-16 NOTE — ED Notes (Signed)
Pt returned from CT °

## 2017-04-16 NOTE — Discharge Instructions (Signed)
Apply solution to tongue every hour if bleeding

## 2017-04-16 NOTE — ED Provider Notes (Signed)
[facility] DEPT Provider Note   CSN: [id number] Arrival date & time: 04/16/17  1627     History   Chief Complaint Chief Complaint  Patient presents with  . Fall  . Mouth Injury    HPI LAVARIUS DOUGHTEN is a 21 y.o. male.  The history is provided by the patient. No language interpreter was used.  Fall  This is a new problem. The current episode started 3 to 5 hours ago. The problem occurs constantly. The problem has been gradually worsening. Nothing aggravates the symptoms. Nothing relieves the symptoms. He has tried nothing for the symptoms. The treatment provided no relief.  Mouth Injury    Pt fell forward and hit his head.   Pt has a cut on his tongue, still bleeding Past Medical History:  Diagnosis Date  . Asthma   . Diabetes mellitus without complication ([place])   . Hypertension    Pulmomary htn  . Kidney dysfunction    left  . Obesity   . Obesity hypoventilation syndrome ([place])   . Prader-Willi syndrome   . Raynaud's phenomenon   . Sleep apnea     Patient Active Problem List   Diagnosis Date Noted  . Hyperkalemia 03/05/2017  . Cough 03/05/2017  . Diastolic congestive heart failure ([place])   . Type 2 diabetes mellitus without complication, without long-term current use of insulin ([place])   . Conjunctivitis 08/22/2015  . Accidental overdose 02/22/2015  . Accidental lisinopril ingestion 02/22/2015  . Tremor bilateral hands 10/27/2014  . Diabetes mellitus type 2 in obese ([place]) 09/27/2014  . Shortness of breath   . Chronic respiratory failure ([place]) 09/15/2014  . Dyspnea   . Hypoventilation syndrome   . Hypoxia   . SOB (shortness of breath)   . Intertrigo 06/30/2013  . Allergic rhinitis 01/07/2013  . Obesity hypoventilation syndrome ([place]) 10/05/2010  . VISUAL IMPAIRMENT 11/13/2009  . RAYNAUD'S SYNDROME 08/10/2009  . Essential hypertension, benign 01/07/2008  . ORTHOSTATIC PROTEINURIA 01/07/2008  . OBESITY, MORBID 02/27/2007  . Prader-Willi syndrome  02/27/2007  . Asthma 10/16/2006  . SYMPTOM, ENURESIS, NOCTURNAL 10/16/2006    Past Surgical History:  Procedure Laterality Date  . NISSEN FUNDOPLICATION    . TONSILLECTOMY         Home Medications    Prior to Admission medications   Medication Sig Start Date End Date Taking? Authorizing Provider  ACCU-CHEK AVIVA PLUS test strip as directed Patient not taking: Reported on 02/26/2017 11/02/15   Leone Haven, MD  ACCU-CHEK SOFTCLIX LANCETS lancets Use as instructed Patient not taking: Reported on 02/26/2017 11/02/15   Melancon, York Ram, MD  albuterol (PROVENTIL) (2.5 MG/3ML) 0.083% nebulizer solution inhale contents of 1 vial in nebulizer once daily 04/16/17   Mayo, Pete Pelt, MD  amoxicillin-clavulanate (AUGMENTIN) 400-57 MG/5ML suspension 24m po bid 04/16/17   SFransico Meadow PA-C  cetirizine (ZYRTEC) 10 MG tablet Take 1 tablet (10 mg total) by mouth daily. 03/27/17   Mayo, KPete Pelt MD  desmopressin (DDAVP) 0.2 MG tablet Take 0.4 mg by mouth at bedtime.     [provider]  fluticasone (FLONASE) 50 MCG/ACT nasal spray instill 2 sprays into each nostril once daily 12/02/16   Haney, Alyssa A, MD  fluticasone (FLOVENT HFA) 110 MCG/ACT inhaler Inhale 2 puffs into the lungs 2 (two) times daily. 04/16/17   Mayo, KPete Pelt MD  hydrochlorothiazide (HYDRODIURIL) 25 MG tablet Take 1 tablet (25 mg total) by mouth daily. 03/06/17   GMercy Riding MD  HYDROcodone-acetaminophen (  HYCET) 7.5-325 mg/15 ml solution Take 15 mLs by mouth 4 (four) times daily as needed for moderate pain. 04/16/17 04/16/18  Fransico Meadow, PA-C  ibuprofen (ADVIL,MOTRIN) 200 MG tablet Take 600-800 mg by mouth every 6 (six) hours as needed for mild pain.    [provider]  Lancets 30G MISC 1 Device by Does not apply route daily before breakfast. Patient not taking: Reported on 02/26/2017 10/06/14   Leone Haven, MD  metFORMIN (GLUCOPHAGE-XR) 500 MG 24 hr tablet take 1 tablet by mouth once daily WITH  BREAKFAST 04/16/17   Mayo, Pete Pelt, MD  Misc. Devices MISC Please provide a pulse oximeter for the ear lobe. Patient has diagnosis of Raynaud, asthma Patient not taking: Reported on 02/26/2017 09/16/11   Judithann Sheen, MD  montelukast (SINGULAIR) 10 MG tablet Take 1 tablet (10 mg total) by mouth at bedtime. 04/16/17   Mayo, Pete Pelt, MD  olopatadine (PATANOL) 0.1 % ophthalmic solution Place 1 drop into both eyes 2 (two) times daily as needed for allergies. 01/20/17   [provider]  oxybutynin (DITROPAN) 5 MG tablet Take 1 tablet (5 mg total) by mouth at bedtime. Patient taking differently: Take 10 mg by mouth at bedtime.  04/20/12   [place] Park, Samuel Germany, MD  PREVIDENT 5000 ENAMEL PROTECT 1.1-5 % PSTE Take 1 application by mouth 2 (two) times daily.  01/14/17   [provider]  PROAIR HFA 108 (90 Base) MCG/ACT inhaler inhale 2 puff by mouth every 6 hours if needed 05/09/16   Collene Gobble, MD  Respiratory Therapy Supplies (NEBULIZER/ADULT MASK) KIT 1 Device by Does not apply route once. Patient not taking: Reported on 02/26/2017 05/20/14   Leone Haven, MD  Respiratory Therapy Supplies MISC BiPap, titration setting 20/15 Patient not taking: Reported on 02/26/2017 08/05/12   Verdie Drown, Samuel Germany, MD  Spacer/Aero-Holding Chambers (AEROCHAMBER MV) inhaler Use with albuterol inhaler Patient not taking: Reported on 02/26/2017 04/20/12   Verdie Drown, Samuel Germany, MD    Family History No family history on file.  Social History Social History  Substance Use Topics  . Smoking status: Never Smoker  . Smokeless tobacco: Never Used  . Alcohol use No     Allergies   Patient has no known allergies.   Review of Systems Review of Systems  All other systems reviewed and are negative.    Physical Exam Updated Vital Signs BP (!) 125/99 (BP Location: Left Wrist)   Pulse (!) 108   Temp 99.2 F (37.3 C) (Oral)   SpO2 100%   Physical Exam  Constitutional: He appears well-developed and  well-nourished.  HENT:  Head: Normocephalic and atraumatic.  1.5 cm laceration tongue.  oozing  Eyes: Conjunctivae are normal.  Neck: Neck supple.  Cardiovascular: Normal rate and regular rhythm.   No murmur heard. Pulmonary/Chest: Effort normal and breath sounds normal. No respiratory distress.  Abdominal: Soft. There is no tenderness.  Musculoskeletal: He exhibits no edema.  Neurological: He is alert.  Skin: Skin is warm and dry.  Psychiatric: He has a normal mood and affect.  Nursing note and vitals reviewed.    ED Treatments / Results  Labs (all labs ordered are listed, but only abnormal results are displayed) Labs Reviewed - No data to display  EKG  EKG Interpretation None       Radiology Ct Head Wo Contrast  Result Date: 04/16/2017 CLINICAL DATA:  21 year old male with Prader-Willi syndrome, obesity. Status post trip and fall  biting tongue. Tongue laceration. EXAM: CT HEAD WITHOUT CONTRAST CT MAXILLOFACIAL WITHOUT CONTRAST TECHNIQUE: Multidetector CT imaging of the head and maxillofacial structures were performed using the standard protocol without intravenous contrast. Multiplanar CT image reconstructions of the maxillofacial structures were also generated. COMPARISON:  Head CT without contrast 04/13/2005. FINDINGS: CT HEAD FINDINGS Brain: Cerebral volume is not significantly changed since 2006. No midline shift, ventriculomegaly, mass effect, evidence of mass lesion, intracranial hemorrhage or evidence of cortically based acute infarction. Gray-white matter differentiation is within normal limits throughout the brain. Partially empty sella. Vascular: No suspicious intracranial vascular hyperdensity. Skull: No skull fracture identified. Other: Large body habitus. Visualized orbits and scalp soft tissues are within normal limits. CT MAXILLOFACIAL FINDINGS Osseous: Mandible intact. No maxilla or zygoma fracture. No nasal bone fracture. Central skullbase intact. Visible cervical  levels appear intact. Orbits: Visualized orbit soft tissues are within normal limits. Bilateral orbital walls intact. Sinuses: Clear aside from mild bubbly opacity in the both sphenoid sinuses, and right sphenoid mucous retention cyst. Bilateral tympanic cavities and mastoids are clear. Soft tissues: Large body habitus. Negative visualized pharynx, parapharyngeal spaces, retropharyngeal space (retropharyngeal space of both carotids), sublingual space, submandibular glands, parotid glands, and cervical lymph nodes. No discrete oral tongue injury is evident. There is gauze material along the anterior tongue. IMPRESSION: 1. No facial fracture or acute traumatic injury identified. 2. Negative noncontrast CT appearance of the brain. Electronically Signed   By: Genevie Ann M.D.   On: 04/16/2017 19:53   Ct Maxillofacial Wo Contrast  Result Date: 04/16/2017 CLINICAL DATA:  20 year old male with Prader-Willi syndrome, obesity. Status post trip and fall biting tongue. Tongue laceration. EXAM: CT HEAD WITHOUT CONTRAST CT MAXILLOFACIAL WITHOUT CONTRAST TECHNIQUE: Multidetector CT imaging of the head and maxillofacial structures were performed using the standard protocol without intravenous contrast. Multiplanar CT image reconstructions of the maxillofacial structures were also generated. COMPARISON:  Head CT without contrast 04/13/2005. FINDINGS: CT HEAD FINDINGS Brain: Cerebral volume is not significantly changed since 2006. No midline shift, ventriculomegaly, mass effect, evidence of mass lesion, intracranial hemorrhage or evidence of cortically based acute infarction. Gray-white matter differentiation is within normal limits throughout the brain. Partially empty sella. Vascular: No suspicious intracranial vascular hyperdensity. Skull: No skull fracture identified. Other: Large body habitus. Visualized orbits and scalp soft tissues are within normal limits. CT MAXILLOFACIAL FINDINGS Osseous: Mandible intact. No maxilla or  zygoma fracture. No nasal bone fracture. Central skullbase intact. Visible cervical levels appear intact. Orbits: Visualized orbit soft tissues are within normal limits. Bilateral orbital walls intact. Sinuses: Clear aside from mild bubbly opacity in the both sphenoid sinuses, and right sphenoid mucous retention cyst. Bilateral tympanic cavities and mastoids are clear. Soft tissues: Large body habitus. Negative visualized pharynx, parapharyngeal spaces, retropharyngeal space (retropharyngeal space of both carotids), sublingual space, submandibular glands, parotid glands, and cervical lymph nodes. No discrete oral tongue injury is evident. There is gauze material along the anterior tongue. IMPRESSION: 1. No facial fracture or acute traumatic injury identified. 2. Negative noncontrast CT appearance of the brain. Electronically Signed   By: Genevie Ann M.D.   On: 04/16/2017 19:53    Procedures Procedures (including critical care time)  Medications Ordered in ED Medications  THROMBI-PAD 3"X3" pad 1 each (not administered)  aminocaproic acid (AMICAR) oral solution 50 mg/mL (5%), 100 ml (not administered)     Initial Impression / Assessment and Plan / ED Course  I have reviewed the triage vital signs and the nursing notes.  Pertinent labs &  imaging results that were available during my care of the patient were reviewed by me and considered in my medical decision making (see chart for details).       Final Clinical Impressions(s) / ED Diagnoses   Final diagnoses:  Laceration of tongue, initial encounter   Amicar to tongue,   Bleeding decreasing.  Mother counseled on use.  Antibiotics and pain medication.   I advised watch for infection New Prescriptions New Prescriptions   AMOXICILLIN-CLAVULANATE (AUGMENTIN) 400-57 MG/5ML SUSPENSION    63m po bid   HYDROCODONE-ACETAMINOPHEN (HYCET) 7.5-325 MG/15 ML SOLUTION    Take 15 mLs by mouth 4 (four) times daily as needed for moderate pain.   Meds  ordered this encounter  Medications  . THROMBI-PAD 3"X3" pad 1 each  . aminocaproic acid (AMICAR) oral solution 50 mg/mL (5%), 100 ml  . amoxicillin-clavulanate (AUGMENTIN) 400-57 MG/5ML suspension    Sig: 175mpo bid    Dispense:  200 mL    Refill:  0    Order Specific Question:   Supervising Provider    Answer:   MILLER, BRIAN [[id number]]  . HYDROcodone-acetaminophen (HYCET) 7.5-325 mg/15 ml solution    Sig: Take 15 mLs by mouth 4 (four) times daily as needed for moderate pain.    Dispense:  120 mL    Refill:  0    Order Specific Question:   Supervising Provider    Answer:   MINoemi [id number]]     SoSidney Ace9/12/18 [id number]    PiDavonna BellingMD 04/17/17 00Benancio Deeds

## 2017-04-16 NOTE — ED Notes (Signed)
Patient transported to CT 

## 2017-04-16 NOTE — ED Triage Notes (Signed)
pts home health nurse reports he tripped over his O2 cord and fell, biting his tongue, laceration to tongue. Pt has hx of prater willi syndrome, wears home O2. Bruising to left cheek, no blood thinners. vss

## 2017-04-24 ENCOUNTER — Telehealth: Payer: Self-pay | Admitting: *Deleted

## 2017-04-24 NOTE — Telephone Encounter (Signed)
Patient's mom called requesting a letter stating patient is disable. Need to list his medical problems. Please give her a call; number on file is correct.  Clovis Pu, RN

## 2017-04-25 ENCOUNTER — Encounter: Payer: Self-pay | Admitting: Internal Medicine

## 2017-04-25 NOTE — Telephone Encounter (Signed)
Mother is aware.  Jazmin Hartsell,CMA  

## 2017-04-25 NOTE — Telephone Encounter (Signed)
Please let [name]'s mom know that his letter has been placed up front for them to pick up. Thanks!

## 2017-04-27 ENCOUNTER — Encounter (HOSPITAL_COMMUNITY): Payer: Self-pay

## 2017-04-27 DIAGNOSIS — J45909 Unspecified asthma, uncomplicated: Secondary | ICD-10-CM | POA: Diagnosis present

## 2017-04-27 DIAGNOSIS — E119 Type 2 diabetes mellitus without complications: Secondary | ICD-10-CM | POA: Diagnosis present

## 2017-04-27 DIAGNOSIS — Z9981 Dependence on supplemental oxygen: Secondary | ICD-10-CM

## 2017-04-27 DIAGNOSIS — K429 Umbilical hernia without obstruction or gangrene: Secondary | ICD-10-CM | POA: Diagnosis present

## 2017-04-27 DIAGNOSIS — J9622 Acute and chronic respiratory failure with hypercapnia: Secondary | ICD-10-CM | POA: Diagnosis present

## 2017-04-27 DIAGNOSIS — N200 Calculus of kidney: Secondary | ICD-10-CM | POA: Diagnosis present

## 2017-04-27 DIAGNOSIS — J9621 Acute and chronic respiratory failure with hypoxia: Secondary | ICD-10-CM | POA: Diagnosis present

## 2017-04-27 DIAGNOSIS — Z9989 Dependence on other enabling machines and devices: Secondary | ICD-10-CM

## 2017-04-27 DIAGNOSIS — Z79891 Long term (current) use of opiate analgesic: Secondary | ICD-10-CM

## 2017-04-27 DIAGNOSIS — R300 Dysuria: Secondary | ICD-10-CM | POA: Diagnosis present

## 2017-04-27 DIAGNOSIS — I110 Hypertensive heart disease with heart failure: Secondary | ICD-10-CM | POA: Diagnosis present

## 2017-04-27 DIAGNOSIS — N261 Atrophy of kidney (terminal): Secondary | ICD-10-CM | POA: Diagnosis present

## 2017-04-27 DIAGNOSIS — K8020 Calculus of gallbladder without cholecystitis without obstruction: Principal | ICD-10-CM | POA: Diagnosis present

## 2017-04-27 DIAGNOSIS — I5032 Chronic diastolic (congestive) heart failure: Secondary | ICD-10-CM | POA: Diagnosis present

## 2017-04-27 DIAGNOSIS — N3944 Nocturnal enuresis: Secondary | ICD-10-CM | POA: Diagnosis present

## 2017-04-27 DIAGNOSIS — R251 Tremor, unspecified: Secondary | ICD-10-CM | POA: Diagnosis present

## 2017-04-27 DIAGNOSIS — G4733 Obstructive sleep apnea (adult) (pediatric): Secondary | ICD-10-CM | POA: Diagnosis present

## 2017-04-27 DIAGNOSIS — Z6841 Body Mass Index (BMI) 40.0 and over, adult: Secondary | ICD-10-CM

## 2017-04-27 DIAGNOSIS — Z7984 Long term (current) use of oral hypoglycemic drugs: Secondary | ICD-10-CM

## 2017-04-27 DIAGNOSIS — I7300 Raynaud's syndrome without gangrene: Secondary | ICD-10-CM | POA: Diagnosis present

## 2017-04-27 DIAGNOSIS — Q871 Congenital malformation syndromes predominantly associated with short stature: Secondary | ICD-10-CM

## 2017-04-27 LAB — URINALYSIS, ROUTINE W REFLEX MICROSCOPIC
BACTERIA UA: NONE SEEN
BILIRUBIN URINE: NEGATIVE
Glucose, UA: NEGATIVE mg/dL
HGB URINE DIPSTICK: NEGATIVE
Ketones, ur: NEGATIVE mg/dL
Nitrite: NEGATIVE
Specific Gravity, Urine: 1.015 (ref 1.005–1.030)
pH: 5 (ref 5.0–8.0)

## 2017-04-27 NOTE — ED Triage Notes (Signed)
Pt had a fall recently and was seen, placed on antibiotic, since has has bilateral had tremors and today started c/o of dysuria.

## 2017-04-28 ENCOUNTER — Inpatient Hospital Stay (HOSPITAL_COMMUNITY)
Admission: EM | Admit: 2017-04-28 | Discharge: 2017-04-30 | DRG: 444 | Disposition: A | Discharge status: Home / Self Care | Payer: Medicaid Other | Attending: Family Medicine | Admitting: Family Medicine

## 2017-04-28 ENCOUNTER — Emergency Department (HOSPITAL_COMMUNITY): Payer: Medicaid Other

## 2017-04-28 DIAGNOSIS — K8020 Calculus of gallbladder without cholecystitis without obstruction: Secondary | ICD-10-CM | POA: Diagnosis not present

## 2017-04-28 DIAGNOSIS — R52 Pain, unspecified: Secondary | ICD-10-CM

## 2017-04-28 DIAGNOSIS — R109 Unspecified abdominal pain: Secondary | ICD-10-CM | POA: Diagnosis present

## 2017-04-28 DIAGNOSIS — R0602 Shortness of breath: Secondary | ICD-10-CM

## 2017-04-28 DIAGNOSIS — J9622 Acute and chronic respiratory failure with hypercapnia: Secondary | ICD-10-CM | POA: Diagnosis not present

## 2017-04-28 LAB — I-STAT CHEM 8, ED
BUN: 37 mg/dL — ABNORMAL HIGH (ref 6–20)
CREATININE: 1.2 mg/dL (ref 0.61–1.24)
Calcium, Ion: 1 mmol/L — ABNORMAL LOW (ref 1.15–1.40)
Chloride: 98 mmol/L — ABNORMAL LOW (ref 101–111)
Glucose, Bld: 113 mg/dL — ABNORMAL HIGH (ref 65–99)
HEMATOCRIT: 41 % (ref 39.0–52.0)
HEMOGLOBIN: 13.9 g/dL (ref 13.0–17.0)
POTASSIUM: 4.7 mmol/L (ref 3.5–5.1)
SODIUM: 141 mmol/L (ref 135–145)
TCO2: 35 mmol/L — AB (ref 22–32)

## 2017-04-28 LAB — COMPREHENSIVE METABOLIC PANEL
ALT: 19 U/L (ref 17–63)
AST: 23 U/L (ref 15–41)
Albumin: 3 g/dL — ABNORMAL LOW (ref 3.5–5.0)
Alkaline Phosphatase: 92 U/L (ref 38–126)
Anion gap: 10 (ref 5–15)
BUN: 33 mg/dL — AB (ref 6–20)
CHLORIDE: 98 mmol/L — AB (ref 101–111)
CO2: 32 mmol/L (ref 22–32)
CREATININE: 1.15 mg/dL (ref 0.61–1.24)
Calcium: 8.6 mg/dL — ABNORMAL LOW (ref 8.9–10.3)
GFR calc Af Amer: >60 mL/min (ref >60)
GFR calc non Af Amer: >60 mL/min (ref >60)
Glucose, Bld: 110 mg/dL — ABNORMAL HIGH (ref 65–99)
Potassium: 4.8 mmol/L (ref 3.5–5.1)
SODIUM: 140 mmol/L (ref 135–145)
Total Bilirubin: 0.8 mg/dL (ref 0.3–1.2)
Total Protein: 7.5 g/dL (ref 6.5–8.1)

## 2017-04-28 LAB — CBG MONITORING, ED
GLUCOSE-CAPILLARY: 102 mg/dL — AB (ref 65–99)
GLUCOSE-CAPILLARY: 90 mg/dL (ref 65–99)

## 2017-04-28 LAB — I-STAT ARTERIAL BLOOD GAS, ED
ACID-BASE EXCESS: 11 mmol/L — AB (ref 0.0–2.0)
ACID-BASE EXCESS: 15 mmol/L — AB (ref 0.0–2.0)
Bicarbonate: 40.4 mmol/L — ABNORMAL HIGH (ref 20.0–28.0)
Bicarbonate: 43.6 mmol/L — ABNORMAL HIGH (ref 20.0–28.0)
O2 Saturation: 92 %
O2 Saturation: 91 %
PCO2 ART: 81.7 mmHg — AB (ref 32.0–48.0)
PH ART: 7.302 — AB (ref 7.350–7.450)
PO2 ART: 73 mmHg — AB (ref 83.0–108.0)
Patient temperature: 98.5
TCO2: 43 mmol/L — ABNORMAL HIGH (ref 22–32)
TCO2: 46 mmol/L — AB (ref 22–32)
pCO2 arterial: 79.3 mmHg (ref 32.0–48.0)
pH, Arterial: 7.348 — ABNORMAL LOW (ref 7.350–7.450)
pO2, Arterial: 67 mmHg — ABNORMAL LOW (ref 83.0–108.0)

## 2017-04-28 LAB — I-STAT VENOUS BLOOD GAS, ED
Acid-Base Excess: 11 mmol/L — ABNORMAL HIGH (ref 0.0–2.0)
BICARBONATE: 40.4 mmol/L — AB (ref 20.0–28.0)
O2 Saturation: 89 %
PO2 VEN: 65 mmHg — AB (ref 32.0–45.0)
Patient temperature: 98.7
TCO2: 43 mmol/L — AB (ref 22–32)
pCO2, Ven: 81.2 mmHg (ref 44.0–60.0)
pH, Ven: 7.305 (ref 7.250–7.430)

## 2017-04-28 LAB — CBC WITH DIFFERENTIAL/PLATELET
BASOS ABS: 0.1 10*3/uL (ref 0.0–0.1)
Basophils Relative: 1 %
EOS ABS: 0.2 10*3/uL (ref 0.0–0.7)
EOS PCT: 2 %
HCT: 39.5 % (ref 39.0–52.0)
HEMOGLOBIN: 11.1 g/dL — AB (ref 13.0–17.0)
Lymphocytes Relative: 20 %
Lymphs Abs: 1.7 10*3/uL (ref 0.7–4.0)
MCH: 21.6 pg — ABNORMAL LOW (ref 26.0–34.0)
MCHC: 28.1 g/dL — ABNORMAL LOW (ref 30.0–36.0)
MCV: 77 fL — ABNORMAL LOW (ref 78.0–100.0)
Monocytes Absolute: 1 10*3/uL (ref 0.1–1.0)
Monocytes Relative: 11 %
NEUTROS PCT: 66 %
Neutro Abs: 5.6 10*3/uL (ref 1.7–7.7)
PLATELETS: 162 10*3/uL (ref 150–400)
RBC: 5.13 MIL/uL (ref 4.22–5.81)
RDW: 18.9 % — ABNORMAL HIGH (ref 11.5–15.5)
WBC: 8.5 10*3/uL (ref 4.0–10.5)

## 2017-04-28 LAB — CREATININE, SERUM: Creatinine, Ser: 0.95 mg/dL (ref 0.61–1.24)

## 2017-04-28 LAB — BRAIN NATRIURETIC PEPTIDE: B Natriuretic Peptide: 189.8 pg/mL — ABNORMAL HIGH (ref 0.0–100.0)

## 2017-04-28 LAB — LIPASE, BLOOD: Lipase: 24 U/L (ref 11–51)

## 2017-04-28 LAB — GLUCOSE, CAPILLARY: GLUCOSE-CAPILLARY: 95 mg/dL (ref 65–99)

## 2017-04-28 LAB — MAGNESIUM: Magnesium: 1.7 mg/dL (ref 1.7–2.4)

## 2017-04-28 MED ORDER — HYDROCHLOROTHIAZIDE 25 MG PO TABS
25.0000 mg | ORAL_TABLET | Freq: Every day | ORAL | Status: DC
  Administered 2017-04-28 – 2017-04-30 (×3): 25 mg via ORAL
  Filled 2017-04-28 (×3): qty 1

## 2017-04-28 MED ORDER — IPRATROPIUM-ALBUTEROL 0.5-2.5 (3) MG/3ML IN SOLN: 3.0000 mL | Freq: Four times a day (QID) | RESPIRATORY_TRACT | Status: DC | PRN

## 2017-04-28 MED ORDER — LORATADINE 10 MG PO TABS
10.0000 mg | ORAL_TABLET | Freq: Every day | ORAL | Status: DC
  Administered 2017-04-28 – 2017-04-30 (×3): 10 mg via ORAL
  Filled 2017-04-28 (×3): qty 1

## 2017-04-28 MED ORDER — MONTELUKAST SODIUM 10 MG PO TABS
10.0000 mg | ORAL_TABLET | Freq: Every day | ORAL | Status: DC
  Administered 2017-04-28 – 2017-04-29 (×2): 10 mg via ORAL
  Filled 2017-04-28 (×2): qty 1

## 2017-04-28 MED ORDER — OXYBUTYNIN CHLORIDE 5 MG PO TABS
10.0000 mg | ORAL_TABLET | Freq: Every day | ORAL | Status: DC
  Administered 2017-04-28 – 2017-04-29 (×2): 10 mg via ORAL
  Filled 2017-04-28 (×2): qty 2

## 2017-04-28 MED ORDER — SOD FLUORIDE-POTASSIUM NITRATE 1.1-5 % DT PSTE: 1.0000 "application " | PASTE | Freq: Two times a day (BID) | DENTAL | Status: DC

## 2017-04-28 MED ORDER — ALBUTEROL SULFATE (2.5 MG/3ML) 0.083% IN NEBU
2.5000 mg | INHALATION_SOLUTION | RESPIRATORY_TRACT | Status: DC | PRN
Start: 2017-04-28 — End: 2017-04-28

## 2017-04-28 MED ORDER — DESMOPRESSIN ACETATE 0.2 MG PO TABS
0.4000 mg | ORAL_TABLET | Freq: Every day | ORAL | Status: DC
  Administered 2017-04-28 – 2017-04-29 (×2): 0.4 mg via ORAL
  Filled 2017-04-28 (×3): qty 2

## 2017-04-28 MED ORDER — PANTOPRAZOLE SODIUM 20 MG PO TBEC
20.0000 mg | DELAYED_RELEASE_TABLET | Freq: Every day | ORAL | Status: DC
  Filled 2017-04-28: qty 1

## 2017-04-28 MED ORDER — FUROSEMIDE 10 MG/ML IJ SOLN: 40.0000 mg | Freq: Once | INTRAMUSCULAR | Status: DC

## 2017-04-28 MED ORDER — GI COCKTAIL ~~LOC~~
30.0000 mL | Freq: Two times a day (BID) | ORAL | Status: DC | PRN
  Filled 2017-04-28: qty 30

## 2017-04-28 MED ORDER — FUROSEMIDE 10 MG/ML IJ SOLN
60.0000 mg | Freq: Two times a day (BID) | INTRAMUSCULAR | Status: DC
  Administered 2017-04-28 – 2017-04-30 (×4): 60 mg via INTRAVENOUS
  Filled 2017-04-28 (×4): qty 6

## 2017-04-28 MED ORDER — ACETAMINOPHEN 325 MG PO TABS
650.0000 mg | ORAL_TABLET | Freq: Four times a day (QID) | ORAL | Status: DC | PRN
  Administered 2017-04-30: 650 mg via ORAL
  Filled 2017-04-28: qty 2

## 2017-04-28 MED ORDER — INSULIN ASPART 100 UNIT/ML ~~LOC~~ SOLN
0.0000 [IU] | Freq: Three times a day (TID) | SUBCUTANEOUS | Status: DC
  Administered 2017-04-29 – 2017-04-30 (×2): 1 [IU] via SUBCUTANEOUS

## 2017-04-28 MED ORDER — FLUTICASONE PROPIONATE 50 MCG/ACT NA SUSP
2.0000 | Freq: Every day | NASAL | Status: DC
  Administered 2017-04-28 – 2017-04-30 (×3): 2 via NASAL
  Filled 2017-04-28: qty 16

## 2017-04-28 MED ORDER — POTASSIUM CHLORIDE CRYS ER 20 MEQ PO TBCR
40.0000 meq | EXTENDED_RELEASE_TABLET | Freq: Once | ORAL | Status: AC
  Administered 2017-04-28: 40 meq via ORAL
  Filled 2017-04-28: qty 2

## 2017-04-28 MED ORDER — IPRATROPIUM-ALBUTEROL 0.5-2.5 (3) MG/3ML IN SOLN
3.0000 mL | Freq: Four times a day (QID) | RESPIRATORY_TRACT | Status: DC
  Administered 2017-04-28: 3 mL via RESPIRATORY_TRACT
  Filled 2017-04-28: qty 3

## 2017-04-28 MED ORDER — PANTOPRAZOLE SODIUM 40 MG IV SOLR
20.0000 mg | INTRAVENOUS | Status: DC
  Administered 2017-04-28 – 2017-04-30 (×3): 20 mg via INTRAVENOUS
  Filled 2017-04-28 (×3): qty 40

## 2017-04-28 MED ORDER — ENOXAPARIN SODIUM 40 MG/0.4ML ~~LOC~~ SOLN
40.0000 mg | SUBCUTANEOUS | Status: DC
  Administered 2017-04-28: 40 mg via SUBCUTANEOUS
  Filled 2017-04-28: qty 0.4

## 2017-04-28 MED ORDER — SODIUM CHLORIDE 0.9 % IV SOLN
2.0000 g | Freq: Once | INTRAVENOUS | Status: AC
  Administered 2017-04-28: 2 g via INTRAVENOUS
  Filled 2017-04-28: qty 20

## 2017-04-28 MED ORDER — HYDROCODONE-ACETAMINOPHEN 5-325 MG PO TABS
1.0000 | ORAL_TABLET | Freq: Once | ORAL | Status: AC
  Administered 2017-04-28: 1 via ORAL
  Filled 2017-04-28: qty 1

## 2017-04-28 MED ORDER — POLYETHYLENE GLYCOL 3350 17 G PO PACK: 17.0000 g | PACK | Freq: Every day | ORAL | Status: DC | PRN

## 2017-04-28 NOTE — Progress Notes (Signed)
Patient transferred to 4E on BIPAP with no incidents. Report given to RT.

## 2017-04-28 NOTE — Consult Note (Signed)
Name: DIEZEL MAZUR MRN: [id number] DOB: [birth year]    ADMISSION DATE:  04/28/2017 CONSULTATION DATE:  04/28/17  REFERRING MD :  Mingo Amber  CHIEF COMPLAINT:  SOB   HISTORY OF PRESENT ILLNESS:  [name] is a 21 y.o. male with a PMH as outlined below including but not limited to Prader Willi syndrome and OSA / OHS with chronic hypoxic respiratory failure, on nocturnal BiPAP 20/15 + 4L/min O2.  He has seen Dr. Lamonte Sakai in the past but last visit was in November 2016.  He has apparently been on home O2 throughout the day at 1L/min since discharge from hospital in July. He had admission for AoC hypoxic respiratory failure and per his godmother, he has "just not been right" since then.  He presented to [facility] ED 9/24 with abdominal pain and possible dysuria. In addition, family states that he has had to turn oxygen up to 4L and he has been sleepier than usual.  In ED, ABG demonstrated AoC hypercapnic respiratory failure (7.30 / 82 / 73).  He was placed on BiPAP and PCCM was asked to see in consultation.  ABG after 10 hours on BiPAP without change (7.35 / 79 / 67).  Abdominal US also showed gallstones and pt is currently being managed conservatively.  PAST MEDICAL HISTORY :   has a past medical history of Asthma; Diabetes mellitus without complication ([place]); Hypertension; Kidney dysfunction; Obesity; Obesity hypoventilation syndrome ([place]); Prader-Willi syndrome; Raynaud's phenomenon; and Sleep apnea.  has a past surgical history that includes Nissen fundoplication and Tonsillectomy. Prior to Admission medications   Medication Sig Start Date End Date Taking? Authorizing Provider  albuterol (PROVENTIL) (2.5 MG/3ML) 0.083% nebulizer solution inhale contents of 1 vial in nebulizer once daily 04/16/17  Yes Mayo, Pete Pelt, MD  cetirizine (ZYRTEC) 10 MG tablet Take 1 tablet (10 mg total) by mouth daily. 03/27/17  Yes Mayo, Pete Pelt, MD  desmopressin (DDAVP) 0.2 MG tablet Take 0.4 mg by mouth at bedtime.    Yes  [provider]  fluticasone (FLONASE) 50 MCG/ACT nasal spray instill 2 sprays into each nostril once daily 12/02/16  Yes Haney, Alyssa A, MD  fluticasone (FLOVENT HFA) 110 MCG/ACT inhaler Inhale 2 puffs into the lungs 2 (two) times daily. 04/16/17  Yes Mayo, Pete Pelt, MD  hydrochlorothiazide (HYDRODIURIL) 25 MG tablet Take 1 tablet (25 mg total) by mouth daily. 03/06/17  Yes Mercy Riding, MD  HYDROcodone-acetaminophen (HYCET) 7.5-325 mg/15 ml solution Take 15 mLs by mouth 4 (four) times daily as needed for moderate pain. 04/16/17 04/16/18 Yes Fransico Meadow, PA-C  ibuprofen (ADVIL,MOTRIN) 200 MG tablet Take 600-800 mg by mouth every 6 (six) hours as needed for mild pain.   Yes [provider]  metFORMIN (GLUCOPHAGE-XR) 500 MG 24 hr tablet take 1 tablet by mouth once daily WITH BREAKFAST 04/16/17  Yes Mayo, Pete Pelt, MD  montelukast (SINGULAIR) 10 MG tablet Take 1 tablet (10 mg total) by mouth at bedtime. 04/16/17  Yes Mayo, Pete Pelt, MD  olopatadine (PATANOL) 0.1 % ophthalmic solution Place 1 drop into both eyes 2 (two) times daily as needed for allergies. 01/20/17  Yes [provider]  oxybutynin (DITROPAN) 5 MG tablet Take 1 tablet (5 mg total) by mouth at bedtime. Patient taking differently: Take 10 mg by mouth at bedtime.  04/20/12  Yes Oh Park, Samuel Germany, MD  PREVIDENT 5000 ENAMEL PROTECT 1.1-5 % PSTE Take 1 application by mouth 2 (two) times daily.  01/14/17  Yes [provider]  PROAIR HFA 108 (90 Base) MCG/ACT inhaler inhale 2 puff by mouth every 6 hours if needed 05/09/16  Yes Byrum, Rose Fillers, MD  ACCU-CHEK AVIVA PLUS test strip as directed 11/02/15   Leone Haven, MD  ACCU-CHEK SOFTCLIX LANCETS lancets Use as instructed 11/02/15   Melancon, York Ram, MD  amoxicillin-clavulanate (AUGMENTIN) 400-57 MG/5ML suspension 45m po bid Patient not taking: Reported on 04/28/2017 04/16/17   SFransico Meadow PA-C  Lancets 30G MISC 1 Device by Does not apply route daily  before breakfast. 10/06/14   SLeone Haven MD  Misc. Devices MISC Please provide a pulse oximeter for the ear lobe. Patient has diagnosis of Raynaud, asthma 09/16/11   SJudithann Sheen MD  Respiratory Therapy Supplies (NEBULIZER/ADULT MASK) KIT 1 Device by Does not apply route once. 05/20/14   SLeone Haven MD  Respiratory Therapy Supplies MISC BiPap, titration setting 20/15 08/05/12   Oh Park, ASamuel Germany MD  Spacer/Aero-Holding Chambers (AEROCHAMBER MV) inhaler Use with albuterol inhaler 04/20/12   OVerdie Drown ASamuel Germany MD   No Known Allergies  FAMILY HISTORY:  family history is not on file. SOCIAL HISTORY:  reports that he has never smoked. He has never used smokeless tobacco. He reports that he does not drink alcohol or use drugs.  REVIEW OF SYSTEMS:   All negative; except for those that are bolded, which indicate positives.  Constitutional: weight loss, weight gain, night sweats, fevers, chills, fatigue, weakness.  HEENT: headaches, sore throat, sneezing, nasal congestion, post nasal drip, difficulty swallowing, tooth/dental problems, visual complaints, visual changes, ear aches. Neuro: difficulty with speech, weakness, numbness, ataxia. CV:  chest pain, orthopnea, PND, swelling in lower extremities, dizziness, palpitations, syncope.  Resp: cough, hemoptysis, dyspnea, wheezing. GI: heartburn, indigestion, abdominal pain, nausea, vomiting, diarrhea, constipation, change in bowel habits, loss of appetite, hematemesis, melena, hematochezia.  GU: dysuria, change in color of urine, urgency or frequency, flank pain, hematuria. MSK: joint pain or swelling, decreased range of motion. Psych: change in mood or affect, depression, anxiety, suicidal ideations, homicidal ideations. Skin: rash, itching, bruising.   SUBJECTIVE:  Has been on BiPAP for past 10 hours.  Asking to come off so he can eat. Denies SOB.  VITAL SIGNS: Temp:  [98.4 F (36.9 C)-98.7 F (37.1 C)] 98.4 F (36.9 C)  (09/24 0602) Pulse Rate:  [91-116] 91 (09/24 1500) Resp:  [17-28] 23 (09/24 1500) BP: (92-162)/(59-133) 125/83 (09/24 1500) SpO2:  [83 %-100 %] 98 % (09/24 1500) Weight:  [175.9 kg (387 lb 11.2 oz)] 175.9 kg (387 lb 11.2 oz) (09/23 2134)  PHYSICAL EXAMINATION: General: Young AA male, morbidly obese, resting in bed, in NAD. Neuro: A&O x 3, non-focal.  HEENT: [place]/AT. PERRL, sclerae anicteric. BiPAP in place Cardiovascular: RRR, no M/R/G.  Lungs: Respirations shallow and unlabored.  CTA bilaterally, No W/R/R. Abdomen: Morbidly obese, BS x 4, soft, NT/ND.  Musculoskeletal: No gross deformities, no edema.  Skin: Intact, warm, no rashes.    Recent Labs Lab 04/28/17 0321 04/28/17 0336  NA 140 141  K 4.8 4.7  CL 98* 98*  CO2 32  --   BUN 33* 37*  CREATININE 1.15 1.20  GLUCOSE 110* 113*    Recent Labs Lab 04/28/17 0321 04/28/17 0336  HGB 11.1* 13.9  HCT 39.5 41.0  WBC 8.5  --   PLT 162  --    Dg Chest Portable 1 View  Result Date: 04/28/2017 CLINICAL DATA:  Shortness of breath. EXAM: PORTABLE CHEST 1 VIEW  COMPARISON:  Radiographs 02/26/2017 FINDINGS: Unchanged cardiomegaly and mediastinal contours. There is vascular congestion with probable perihilar edema. Small right pleural effusion on CT not well demonstrated radiographically. No pneumothorax. IMPRESSION: Unchanged cardiomegaly. Vascular congestion and mild perihilar edema appears similar to prior exam. Electronically Signed   By: Jeb Levering M.D.   On: 04/28/2017 03:50   Ct Renal Stone Study  Result Date: 04/28/2017 CLINICAL DATA:  Flank pain.  Stone disease suspected. EXAM: CT ABDOMEN AND PELVIS WITHOUT CONTRAST TECHNIQUE: Multidetector CT imaging of the abdomen and pelvis was performed following the standard protocol without IV contrast. COMPARISON:  Renal ultrasound 12/02/2006 FINDINGS: Lower chest: Patchy lung base opacities bilaterally. Trace right pleural effusion. Hepatobiliary: The liver is enlarged spanning 21  cm cranial caudal. No evidence of focal lesion allowing for lack contrast. Gallbladder physiologically distended, no calcified stone. No biliary dilatation. Pancreas: No ductal dilatation or inflammation. Parenchymal atrophy of the head. Spleen: Normal in size without focal abnormality. Adrenals/Urinary Tract: No adrenal nodule, right adrenal gland not well-defined. Atrophic right kidney with compensatory hypertrophy of the left kidney. No left nephrolithiasis. No hydronephrosis. No ureteral stones. Urinary bladder is physiologically distended without wall thickening or stone. Stomach/Bowel: The stomach is nondistended. No bowel obstruction, wall thickening or inflammation. Umbilical hernia contains noninflamed nonobstructed: Marland Kitchen Small to moderate colonic stool burden. The appendix is not confidently visualized, no pericecal or right lower quadrant inflammation. Vascular/Lymphatic: Normal caliber abdominal aorta. No bulky adenopathy. Reproductive: Prostate is unremarkable. Other: No ascites or free air. Edema in the anterior abdominal wall and bilateral flanks likely secondary to habitus. Musculoskeletal: Transitional lumbosacral anatomy. There are no acute or suspicious osseous abnormalities. IMPRESSION: 1. No renal stones or obstructive uropathy. Atrophic right kidney with compensatory hypertrophy of the left kidney. 2. Umbilical hernia containing nonobstructed noninflamed colon. No bowel obstruction or inflammation. 3. Hepatomegaly. 4. Patchy opacities at the lung bases, may be atelectasis or pulmonary edema. Small right pleural effusion. Electronically Signed   By: Jeb Levering M.D.   On: 04/28/2017 03:02   US Abdomen Limited Ruq  Result Date: 04/28/2017 CLINICAL DATA:  Right upper quadrant pain EXAM: ULTRASOUND ABDOMEN LIMITED RIGHT UPPER QUADRANT COMPARISON:  CT abdomen pelvis 04/28/2017 FINDINGS: Gallbladder: There are multiple old stones within the gallbladder measuring up to 1.7 cm. The sonographic  Percell Miller sign could not be adequately assessed as the patient had received pain medication. He did report some mild to moderate pain over the gallbladder. Common bile duct: Diameter: 4 mm Liver: No focal lesion identified. Within normal limits in parenchymal echogenicity. Portal vein is patent on color Doppler imaging with normal direction of blood flow towards the liver. IMPRESSION: Cholelithiasis without wall thickening or pericholecystic fluid. No biliary dilatation. Electronically Signed   By: Ulyses Jarred M.D.   On: 04/28/2017 04:44    STUDIES:  CXR 9/24 > vascular congestion. CT A / P 9/24 > no renal stones or obstructive uropathy.  Atrophic right kidney with compensatory hypertrophy of the left kidney. Hepatomegaly. RUQ [name] 9/24 > cholelithiasis without wall thickening or pericholecystic fluid.  No biliary dilatation.  SIGNIFICANT EVENTS  9/24 > admit.  ASSESSMENT / PLAN:  AoC hypoxemic and hypercapnic respiratory failure - with known underlying OSA / OHS.  Recently discharged with 1L O2/min throughout the day.  Repeat ABG after 10 hours with no change in pCO2 and pH remains normal (chronic hypercapnia). Acute pulmonary edema. Hx OSA / OHS (on nocturnal BiPAP 20/15 with 4L O2 / min bleed in), asthma. Plan: Avoid  sedating meds. Allow break from BiPAP given normal mental status and pH. Resume nocturnal BiPAP QHS. Once off BiPAP, continue supplemental O2 to maintain SpO2 > 90%. Will give 1 dose 50m lasix. Bronchial hygiene. Continue weight loss / exercise efforts. CXR intermittently.   RMontey Hora PTrumbauersvillePulmonary & Critical Care Medicine Pager: [phone] or [phone]/24/2018, 4:40 PM

## 2017-04-28 NOTE — ED Provider Notes (Signed)
[facility] DEPT Provider Note   CSN: [id number] Arrival date & time: 04/27/17  2107     History   Chief Complaint Chief Complaint  Patient presents with  . Tremors  . Dysuria    HPI [name] is a 21 y.o. male.  The history is provided by the patient and a parent.  Dysuria   This is a new problem. The problem occurs every urination. The problem has been gradually worsening. The pain is moderate. There has been no fever. Associated symptoms include flank pain. Pertinent negatives include no vomiting. He has tried nothing for the symptoms.  pt with complex medical history including Prader Willi syndrome and chronic respiratory failure on oxygen presents with right flank pain and also dysuria No fever/vomiting Patient also continues to have tremors of unclear etiology He had recent fall/tongue injury on previous ED stay   Past Medical History:  Diagnosis Date  . Asthma   . Diabetes mellitus without complication ([place])   . Hypertension    Pulmomary htn  . Kidney dysfunction    left  . Obesity   . Obesity hypoventilation syndrome ([place])   . Prader-Willi syndrome   . Raynaud's phenomenon   . Sleep apnea     Patient Active Problem List   Diagnosis Date Noted  . Hyperkalemia 03/05/2017  . Cough 03/05/2017  . Diastolic congestive heart failure ([place])   . Type 2 diabetes mellitus without complication, without long-term current use of insulin ([place])   . Conjunctivitis 08/22/2015  . Accidental overdose 02/22/2015  . Accidental lisinopril ingestion 02/22/2015  . Tremor bilateral hands 10/27/2014  . Diabetes mellitus type 2 in obese ([place]) 09/27/2014  . Shortness of breath   . Chronic respiratory failure ([place]) 09/15/2014  . Dyspnea   . Hypoventilation syndrome   . Hypoxia   . SOB (shortness of breath)   . Intertrigo 06/30/2013  . Allergic rhinitis 01/07/2013  . Obesity hypoventilation syndrome ([place]) 10/05/2010  . VISUAL IMPAIRMENT 11/13/2009  . RAYNAUD'S SYNDROME  08/10/2009  . Essential hypertension, benign 01/07/2008  . ORTHOSTATIC PROTEINURIA 01/07/2008  . OBESITY, MORBID 02/27/2007  . 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 Asthma 10/16/2006  . SYMPTOM, ENURESIS, NOCTURNAL 10/16/2006    Past Surgical History:  Procedure Laterality Date  . NISSEN FUNDOPLICATION    . TONSILLECTOMY         Home Medications    Prior to Admission medications   Medication Sig Start Date End Date Taking? Authorizing Provider  ACCU-CHEK AVIVA PLUS test strip as directed Patient not taking: Reported on 02/26/2017 11/02/15   Leone Haven, MD  ACCU-CHEK SOFTCLIX LANCETS lancets Use as instructed Patient not taking: Reported on 02/26/2017 11/02/15   Melancon, York Ram, MD  albuterol (PROVENTIL) (2.5 MG/3ML) 0.083% nebulizer solution inhale contents of 1 vial in nebulizer once daily 04/16/17   Mayo, Pete Pelt, MD  amoxicillin-clavulanate (AUGMENTIN) 400-57 MG/5ML suspension 31m po bid 04/16/17   SFransico Meadow PA-C  cetirizine (ZYRTEC) 10 MG tablet Take 1 tablet (10 mg total) by mouth daily. 03/27/17   Mayo, KPete Pelt MD  desmopressin (DDAVP) 0.2 MG tablet Take 0.4 mg by mouth at bedtime.     [provider]  fluticasone (FLONASE) 50 MCG/ACT nasal spray instill 2 sprays into each nostril once daily 12/02/16   Haney, Alyssa A, MD  fluticasone (FLOVENT HFA) 110 MCG/ACT inhaler Inhale 2 puffs into the lungs 2 (two) times daily. 04/16/17   Mayo, KPete Pelt MD  hydrochlorothiazide (HYDRODIURIL) 25 MG tablet  Take 1 tablet (25 mg total) by mouth daily. 03/06/17   Mercy Riding, MD  HYDROcodone-acetaminophen (HYCET) 7.5-325 mg/15 ml solution Take 15 mLs by mouth 4 (four) times daily as needed for moderate pain. 04/16/17 04/16/18  Fransico Meadow, PA-C  ibuprofen (ADVIL,MOTRIN) 200 MG tablet Take 600-800 mg by mouth every 6 (six) hours as needed for mild pain.    [provider]  Lancets 30G MISC 1 Device by Does not apply route daily before breakfast. Patient not  taking: Reported on 02/26/2017 10/06/14   Leone Haven, MD  metFORMIN (GLUCOPHAGE-XR) 500 MG 24 hr tablet take 1 tablet by mouth once daily WITH BREAKFAST 04/16/17   Mayo, Pete Pelt, MD  Misc. Devices MISC Please provide a pulse oximeter for the ear lobe. Patient has diagnosis of Raynaud, asthma Patient not taking: Reported on 02/26/2017 09/16/11   Judithann Sheen, MD  montelukast (SINGULAIR) 10 MG tablet Take 1 tablet (10 mg total) by mouth at bedtime. 04/16/17   Mayo, Pete Pelt, MD  olopatadine (PATANOL) 0.1 % ophthalmic solution Place 1 drop into both eyes 2 (two) times daily as needed for allergies. 01/20/17   [provider]  oxybutynin (DITROPAN) 5 MG tablet Take 1 tablet (5 mg total) by mouth at bedtime. Patient taking differently: Take 10 mg by mouth at bedtime.  04/20/12   [place] Park, Samuel Germany, MD  PREVIDENT 5000 ENAMEL PROTECT 1.1-5 % PSTE Take 1 application by mouth 2 (two) times daily.  01/14/17   [provider]  PROAIR HFA 108 (90 Base) MCG/ACT inhaler inhale 2 puff by mouth every 6 hours if needed 05/09/16   Collene Gobble, MD  Respiratory Therapy Supplies (NEBULIZER/ADULT MASK) KIT 1 Device by Does not apply route once. Patient not taking: Reported on 02/26/2017 05/20/14   Leone Haven, MD  Respiratory Therapy Supplies MISC BiPap, titration setting 20/15 Patient not taking: Reported on 02/26/2017 08/05/12   Verdie Drown, Samuel Germany, MD  Spacer/Aero-Holding Chambers (AEROCHAMBER MV) inhaler Use with albuterol inhaler Patient not taking: Reported on 02/26/2017 04/20/12   Verdie Drown, Samuel Germany, MD    Family History No family history on file.  Social History Social History  Substance Use Topics  . Smoking status: Never Smoker  . Smokeless tobacco: Never Used  . Alcohol use No     Allergies   Patient has no known allergies.   Review of Systems Review of Systems  Constitutional: Negative for fever.  Gastrointestinal: Negative for vomiting.  Genitourinary: Positive  for dysuria and flank pain.  All other systems reviewed and are negative.    Physical Exam Updated Vital Signs BP (!) 150/102 (BP Location: Right Arm)   Pulse (!) 116   Temp 98.7 F (37.1 C) (Oral)   Resp (!) 22   Ht 1.524 m (5')   Wt (!) 175.9 kg (387 lb 11.2 oz)   SpO2 95%   BMI 75.72 kg/m   Physical Exam CONSTITUTIONAL: Obese, short stature HEAD: Normocephalic/atraumatic EYES: EOMI/PERRL ENMT: Mucous membranes moist NECK: supple no meningeal signs SPINE/BACK:entire spine nontender CV: S1/S2 noted, no murmurs/rubs/gallops noted LUNGS: Lungs are clear to auscultation bilaterally, no apparent distress ABDOMEN: soft, mild tenderness to RUQ/flank, no rebound or guarding, bowel sounds noted throughout abdomen GU: no erythema to penis, no tenderness to suprapubic region, mother present for exam, exam limited due to habitus NEURO: Pt is awake/alert/appropriate, no significant tremor noted SKIN: warm, color normal PSYCH: no abnormalities of mood noted, alert and oriented to  situation   ED Treatments / Results  Labs (all labs ordered are listed, but only abnormal results are displayed) Labs Reviewed  URINALYSIS, ROUTINE W REFLEX MICROSCOPIC - Abnormal; Notable for the following:       Result Value   APPearance HAZY (*)    Protein, ur >=300 (*)    Leukocytes, UA SMALL (*)    Squamous Epithelial / LPF 0-5 (*)    All other components within normal limits  CBC WITH DIFFERENTIAL/PLATELET - Abnormal; Notable for the following:    Hemoglobin 11.1 (*)    MCV 77.0 (*)    MCH 21.6 (*)    MCHC 28.1 (*)    RDW 18.9 (*)    All other components within normal limits  COMPREHENSIVE METABOLIC PANEL - Abnormal; Notable for the following:    Chloride 98 (*)    Glucose, Bld 110 (*)    BUN 33 (*)    Calcium 8.6 (*)    Albumin 3.0 (*)    All other components within normal limits  I-STAT CHEM 8, ED - Abnormal; Notable for the following:    Chloride 98 (*)    BUN 37 (*)    Glucose, Bld  113 (*)    Calcium, Ion 1.00 (*)    TCO2 35 (*)    All other components within normal limits  I-STAT VENOUS BLOOD GAS, ED - Abnormal; Notable for the following:    pCO2, Ven 81.2 (*)    pO2, Ven 65.0 (*)    Bicarbonate 40.4 (*)    TCO2 43 (*)    Acid-Base Excess 11.0 (*)    All other components within normal limits  LIPASE, BLOOD    EKG  EKG Interpretation None       Radiology Dg Chest Portable 1 View  Result Date: 04/28/2017 CLINICAL DATA:  Shortness of breath. EXAM: PORTABLE CHEST 1 VIEW COMPARISON:  Radiographs 02/26/2017 FINDINGS: Unchanged cardiomegaly and mediastinal contours. There is vascular congestion with probable perihilar edema. Small right pleural effusion on CT not well demonstrated radiographically. No pneumothorax. IMPRESSION: Unchanged cardiomegaly. Vascular congestion and mild perihilar edema appears similar to prior exam. Electronically Signed   By: Jeb Levering M.D.   On: 04/28/2017 03:50   Ct Renal Stone Study  Result Date: 04/28/2017 CLINICAL DATA:  Flank pain.  Stone disease suspected. EXAM: CT ABDOMEN AND PELVIS WITHOUT CONTRAST TECHNIQUE: Multidetector CT imaging of the abdomen and pelvis was performed following the standard protocol without IV contrast. COMPARISON:  Renal ultrasound 12/02/2006 FINDINGS: Lower chest: Patchy lung base opacities bilaterally. Trace right pleural effusion. Hepatobiliary: The liver is enlarged spanning 21 cm cranial caudal. No evidence of focal lesion allowing for lack contrast. Gallbladder physiologically distended, no calcified stone. No biliary dilatation. Pancreas: No ductal dilatation or inflammation. Parenchymal atrophy of the head. Spleen: Normal in size without focal abnormality. Adrenals/Urinary Tract: No adrenal nodule, right adrenal gland not well-defined. Atrophic right kidney with compensatory hypertrophy of the left kidney. No left nephrolithiasis. No hydronephrosis. No ureteral stones. Urinary bladder is  physiologically distended without wall thickening or stone. Stomach/Bowel: The stomach is nondistended. No bowel obstruction, wall thickening or inflammation. Umbilical hernia contains noninflamed nonobstructed: Marland Kitchen Small to moderate colonic stool burden. The appendix is not confidently visualized, no pericecal or right lower quadrant inflammation. Vascular/Lymphatic: Normal caliber abdominal aorta. No bulky adenopathy. Reproductive: Prostate is unremarkable. Other: No ascites or free air. Edema in the anterior abdominal wall and bilateral flanks likely secondary to habitus. Musculoskeletal: Transitional lumbosacral anatomy. There  are no acute or suspicious osseous abnormalities. IMPRESSION: 1. No renal stones or obstructive uropathy. Atrophic right kidney with compensatory hypertrophy of the left kidney. 2. Umbilical hernia containing nonobstructed noninflamed colon. No bowel obstruction or inflammation. 3. Hepatomegaly. 4. Patchy opacities at the lung bases, may be atelectasis or pulmonary edema. Small right pleural effusion. Electronically Signed   By: Jeb Levering M.D.   On: 04/28/2017 03:02   US Abdomen Limited Ruq  Result Date: 04/28/2017 CLINICAL DATA:  Right upper quadrant pain EXAM: ULTRASOUND ABDOMEN LIMITED RIGHT UPPER QUADRANT COMPARISON:  CT abdomen pelvis 04/28/2017 FINDINGS: Gallbladder: There are multiple old stones within the gallbladder measuring up to 1.7 cm. The sonographic Percell Miller sign could not be adequately assessed as the patient had received pain medication. He did report some mild to moderate pain over the gallbladder. Common bile duct: Diameter: 4 mm Liver: No focal lesion identified. Within normal limits in parenchymal echogenicity. Portal vein is patent on color Doppler imaging with normal direction of blood flow towards the liver. IMPRESSION: Cholelithiasis without wall thickening or pericholecystic fluid. No biliary dilatation. Electronically Signed   By: Ulyses Jarred M.D.   On:  04/28/2017 04:44    Procedures Procedures   Medications Ordered in ED Medications  HYDROcodone-acetaminophen (NORCO/VICODIN) 5-325 MG per tablet 1 tablet (1 tablet Oral Given 04/28/17 0243)     Initial Impression / Assessment and Plan / ED Course  I have reviewed the triage vital signs and the nursing notes.  Pertinent labs & imaging results that were available during my care of the patient were reviewed by me and considered in my medical decision making (see chart for details).    2:26 AM Pt stable Will get Ct imaging to evaluate for stone due to h/o flank pain/dysuria.  Exam very diffcult due to body size Tremor workup deferred to outpatient team 3:24 AM CT scan does not reveal acute process Mother reports "something isn't right" She requests further workup CXR/US imaging and labs ordered Pt reports his pain is improved 6:02 AM Pt found to have acute on chronic respiratory failure Also noted to have cholelithiasis Will admit Pt is usually on bipap at night Will order bipap He has had some somnolence but is now more awake/alert, reports pain improved  6:08 AM D/w family medicine for admission  Final Clinical Impressions(s) / ED Diagnoses   Final diagnoses:  Pain  Calculus of gallbladder without cholecystitis without obstruction  Acute on chronic respiratory failure with hypercapnia [facility])    New Prescriptions New Prescriptions   No medications on file     Ripley Fraise, MD 04/28/17 [id number]

## 2017-04-28 NOTE — ED Notes (Signed)
Admitting made aware of continues high blood pressure readings

## 2017-04-28 NOTE — Progress Notes (Signed)
FPTS Interim Progress Note  S: Lemuel was resting comfortably in bed with Bipap. He had eaten a [place] sandwich per mom and tolerated it well. He is still having bilateral hand tremors and still endorses pain on urination.  O:  Gen: Awake and Alert, NAD Cardio: RRR, no murmurs, gallps Resp: CTAB, no wheezing Abd: soft, non-tender, +bs, obese abdomen Ext: no cyanosis, +1 pitting edema BP (!) 133/101   Pulse (!) 101   Temp 98.4 F (36.9 C) (Oral)   Resp (!) 23   Ht 5' (1.524 m)   Wt (!) 387 lb 11.2 oz (175.9 kg)   SpO2 97%   BMI 75.72 kg/m     A/P: Acute on chronic respiratory failure: stable - Continue Bipap overnight Abdominal pain: stable - Continue to monitor Hypocalcemia - Recheck in am tomorrow, if persist consult Neuro (Dr. Sharene Skeans) Dysuria:  - Consider possible yeast infection vs obstruction if this does not resolve  Arlyce Harman, DO 04/28/2017, 9:57 PM PGY-1, [facility] Family Medicine Service pager [phone]

## 2017-04-28 NOTE — ED Notes (Signed)
Admitting paged to verify lasix order. Will await more instruction on lasix administration order.

## 2017-04-28 NOTE — Progress Notes (Signed)
Changed mode on V60/BIPAP to AVAPS to improve ventilation and achieve VT>300. Patient tolerating well at this time. Changes explained to mother at bedside and therapist receiving patient.

## 2017-04-28 NOTE — Progress Notes (Signed)
Family Medicine Teaching Service Daily Progress Note Intern Pager: [phone]  Patient name: [name] Medical record number: [id number] Date of birth: 17-Jan-1996 Age: 21 y.o. Gender: male  Primary Care Provider: Mayo, Allyn Kenner, MD Consultants: Pulmonology/Critical Care Code Status: FULL  Pt Overview and Major Events to Date:   9/24 - CXR 9/24 - RUQ Abd U/S  Assessment and Plan:  Acute on chronic respiratory failure: Histroy of OSA and Obesity Hypoventilation Syndrome -Patient has been noted to be hypercarbic on both venous and arterial blood gas. He is currently on BiPAP and he uses this at home. He also has a home oxygen requirement since his previous admission in July 2018. He is morbidly obese with history of OHS.  Recent echo in July showing preserved ejection fraction and mild dilation of RV.  VBG showing worsening retention with CO2 to 81.7 and pH 7.3 consistent with compensated resp acidosis. Has history of OSA. - Continue diuresis with Lasix as needed. Pt had good urine output in response to  IV Lasix. - Pulmonary recs: diruesis, Bipap at night, maintain O2 sats above 90% with supplemental O2 - Bipap at bed time per home regimen - Albuterol q2 PRN - Continue home medications of flonase, loratadine, and montelukast - Duoneb q6 PRN  RUQ Abdominal pain: stable. He did have pain that began after eating yesterday. Abdominal ultrasound did show gallstones. It is possible this is pain from his cholelithiasis. Patient did tolerate dinner last night, has no abdominal pain this morning. -His liver function testing is within normal limits. - If abdominal pain continues consider consulting surgery for consideration of cholecystectomy. - Tylenol for pain PRN - Hepatitis panel orderd - Protonix  daily - GI cocktail PRN  Hypocalcemia: Non-ionized calcium is 1. Replenished his calcium today. Patient had positive Trousseau sign and still exhibits mild bilateral hand tremors  into the evening of 9/24.  -If new tremor continues despite calcium repletion would consult pediatric neurology. Patient is followed by Dr. Sharene Skeans and already has an appointment coming up on October 14. - Calcium gluconate given yesterday - Calcium carbonate  tab TID started today  Bilateral resting hand tremors: continuing. Not getting worse or noticed in other limbs. 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 - Repeat calcium was given and this am ionized Ca was pending. - Consider neurology consult as calcium is being replenished but tremors have remained.   Dysuria: Denies this am. Seems to be an intermittent issue as patient had endorsed pain on urination evening of 9/24. He did have small number of leukocytes on U/A otherwise unremarkable. - Small leukocytes but otherwise no real clear indication of infection on urinalysis. - Continue to monitor  T2DM: chronic, stable Glucose of 110 on admission. A1C on 7/18 6.6.  On metformin 500 mg XR at home. -sensitive SSI -qACHS CBGs   Enuresis: chronic, stable Mother reports nighttime enuresis -continue home oxybutynin at bedtime  -continue home desmopressin   HTN: liable but overall controlled. Suspect that pain and current issues are contributing to his transient increase in BP. BP 143/68 on admission. Well controlled at last office visit on 03/10/17 with HCTZ.  -continue home hydrochlorothiazide   - will continue to monitor  Morbid Obesity Secondary toPrader-Willi syndrome -limit to 1200 kcal a day -Nutrition consult  FEN/GI: Carb modified/PPI PPx: Lovenox  Disposition: Telemetry  Subjective:  Pt states this am he feels better and denies any difficulty breathing, no chest pain, no SOB, no abdominal pain, no nausea,  vomiting, no dyuria. Patient was able to eat dinner last night and had no issues. Per mom, he looks a lot better this morning after having BiPap last night. He is on 4L O2 [place] this  am.  Objective: Temp:  [97.8 F (36.6 C)-98.7 F (37.1 C)] 97.8 F (36.6 C) (09/25 0521) Pulse Rate:  [87-113] 87 (09/25 0444) Resp:  [17-32] 20 (09/25 0521) BP: (92-269)/(59-214) 132/92 (09/25 0521) SpO2:  [84 %-100 %] 100 % (09/25 0521) FiO2 (%):  [40 %] 40 % (09/24 2020) Weight:  [377 lb 11.2 oz (171.3 kg)] 377 lb 11.2 oz (171.3 kg) (09/25 0521) Physical Exam: General: Awake and Alert, NAD Cardiovascular: RRR, no murmurs, distant heart sounds Respiratory: CTAB, no wheezing, no crackles Abdomen: obese abdomen, non-distended, soft, non-tender, +bs  Extremities: bilateral resting tremors in both hands, otherwise normal. Skin: warm, dry, intact, no rashes  Laboratory:  Recent Labs Lab 04/28/17 0321 04/28/17 0336 04/29/17 0327  WBC 8.5  --  6.5  HGB 11.1* 13.9 10.4*  HCT 39.5 41.0 37.9*  PLT 162  --  162    Recent Labs Lab 04/28/17 0321 04/28/17 0336 04/28/17 1535 04/29/17 0327  NA 140 141  --  143  K 4.8 4.7  --  4.2  CL 98* 98*  --  95*  CO2 32  --   --  39*  BUN 33* 37*  --  25*  CREATININE 1.15 1.20 0.95 0.92  CALCIUM 8.6*  --   --  8.8*  PROT 7.5  --   --   --   BILITOT 0.8  --   --   --   ALKPHOS 92  --   --   --   ALT 19  --   --   --   AST 23  --   --   --   GLUCOSE 110* 113*  --  87   9/23 - U/A: small leukocytes, no nitrites, 0-5 squamous epithelial cells, protein >300  9/24 - Lipase: 24 9/24 - ABG: pH 7.302, CO2 81.7, Bicarb 40.4; repeat pH 7.348, CO2 79.3, Bicarb 43.6 9/24 - VBG: pH 7.305, CO2 81.2, Bicarb 40.4 9/24 - BNP: 189.8 9/24 - Hepatitis panel: pending 9/24 - Magnesium: 1.7 9/24 - Ionized Calcium: 1.00 9/25 - Ionized Calcium: pending   Imaging/Diagnostic Tests:  9/24 - CXR: IMPRESSION: Unchanged cardiomegaly. Vascular congestion and mild perihilar edema appears similar to prior exam.  9/24 - Abd U/S Limited RUQ: IMPRESSION: Unchanged cardiomegaly. Vascular congestion and mild perihilar edema appears similar to prior  exam.  Arlyce Harman, DO 04/29/2017, 9:30 AM PGY-1, [facility] Family Medicine FPTS Intern pager: [phone], text pages welcome

## 2017-04-28 NOTE — ED Notes (Signed)
Patient transported to CT 

## 2017-04-28 NOTE — Progress Notes (Signed)
New Admission Note:   Arrival Method: From ED via Bed Mental Orientation: A&O Assessment: Completed Skin: Intact IV: R FA Pain: denies pain Tubes: BIPAP Safety Measures: Safety Fall Prevention Plan has been discussed  Admission [facility] [place] Orientation: Patient has been orientated to the room, unit and staff.  Family: Mother at bedside  Orders to be reviewed and implemented. Will continue to monitor the patient. Call light has been placed within reach and bed alarm has been activated. Tele applied. CCMD notified     Gregor Hams, RN

## 2017-04-28 NOTE — ED Notes (Signed)
RT at bedside placing patient on BIPAP.

## 2017-04-28 NOTE — ED Notes (Signed)
Patient oxygen on 4 L 84% Respiratory placed patient back on bipap

## 2017-04-28 NOTE — Progress Notes (Signed)
Critical ABG results hand delivered to CCM MD Marchelle Gearing). Ok to take bipap off at this time to allow pt to eat and will need to wear with sleep. RT will continue to monitor

## 2017-04-28 NOTE — ED Notes (Signed)
Dr Bebe Shaggy given venous blood gas results

## 2017-04-28 NOTE — ED Notes (Addendum)
Attempted report. Floor refusing patient. AC aware.

## 2017-04-28 NOTE — ED Notes (Addendum)
Family medicine verbally ordering bipap to be taken off so patient can eat. Will notify respiratory and order regular diet

## 2017-04-28 NOTE — ED Notes (Signed)
Patient off bi pap now on 4L [place] at 93%

## 2017-04-28 NOTE — ED Notes (Signed)
Patient given something to eat per [facility] medicine approval

## 2017-04-28 NOTE — ED Notes (Signed)
Went in to answer call bell. pts family at bedside stated his o2 sats were staying in the 80%s. Attempted to change pulse ox and see if it could be fixed. Pts family then stated "i just urned the oxygen up to see if it would help." o2 turned back to what he was on before. RN and Respiratory notified.

## 2017-04-28 NOTE — H&P (Signed)
[facility] Admission History and Physical Service Pager: [phone]  Patient name: [name] Medical record number: [id number] Date of birth: 15-Apr-1996 Age: 21 y.o. Gender: male  Primary Care Provider: Mayo, Pete Pelt, MD Consultants: none Code Status: Full  Chief Complaint: Dysuria  Assessment and Plan: CARLUS STAY is a 21 y.o. male presenting with dysuria . PMH is significant for Prader-Willi syndrome, chronic respiratory failure on oxygen, morbid obesity, obesity hypoventilation syndrome, bilateral resting tremor, type 2 diabetes  Dysuria with undifferentiated RUQ pain Patient has been complaining of right upper quadrant pain and burning with urination for one day. His Godmother is at his bedside and reports that his pain tolerance is very high, and he is complaining of pain it must severe. Points to the right upper quadrant when asked to locate pain. No grimacing with deep palpation. In the ED UA was unremarkable and CT renal stone study revealed no hydronephrosis, nephrolithiasis, no pancreatic inflammation and normal lipase level, but did show physiologic distention of the gallbladder, atrophic R kidney and compensated hypertrophied L kidney. Does have umbilical hernia containing non-obstructed, non-inflamed colon, but this unlikely to be the source of his pain.  RUQ ultrasound showed nephrolithiasis without pericholecystic fluid, wall thickening or biliary dilatation.  No fevers, chills, nausea, vomiting or diarrhea. No known history of GERD or gastritis and no epigastric tenderness.  Last BM was yesterday and normal and is currently asking for a diet making this unlikely to be bowel obstruction. Imaging did show mild-moderate stool burden. Other potential causes of RUQ pain could be focal pneumonia, but had clear CXR, is afebrile and is not producing sputum, however this does not rule out PNA.  Patient did recently complete course of Augmentin for tongue  laceration, which can cause GI upset, but this is less likely. Broad differential at this time and will consider all causes, but cholelithiasis might be at the top of the list.  - Admitted for observation to step-down with telemetry attending Mingo Amber - GI cocktail PRN - protonix 68m daily - hepatitis panel - miralax PRN daily for constipation - consider norco for pain control (did not order) - tylenol PRN pain  Acute hypoxic respiratory failure with history of OHS Patient has had new oxygen requirement of 1L since his last admission in August for shortness of breath.  For the past three weeks has had increased O2 requirement of 3L to keep saturations >90%. Patient on bipap during my exam and appeared to be breathing comfortably and reported the same.  He is morbidly obese with history of OHS.  Recent echo in July showing preserved ejection fraction and mild dilation of RV.  VBG showing worsening retention with CO2 to 81.7 and pH 7.3 consistent with compensated resp acidosis.  - consider repeat VBG/CXR if worsening resp status -albuterol q2hrs PRN -continue home meds of flonase, loratadine, and montelukast -duoneb q6 -supplemental O2 to keep O2 saturation >90% -bipap at bedtime  -continuous pulse ox -routine vital signs  -follows with Dr. BLamonte Sakai consider Pulmonology   T2DM Glucose of 110 on admission. A1C on 7/18 6.6.  On metformin 500 mg XR at home. -sensitive SSI -qACHS CBGs   Enuresis Mother reports nighttime enuresis -continue home oxybutynin at bedtime  -continue home desmopressin   HTN BP of 143/68 on admission. Well controlled at last office visit on 03/10/17 with HCTZ.  -continue home hydrochlorothiazide   - will continue to monitor  Morbid Obesity Secondary to Prader-Willi syndrome -limit to 1200 kcal  a day -Nutrition consult  Resting tremor Godmother reports that this is new, however this was documented back in 2016 per chart review.  - will continue to  monitor  FEN/GI: carb modified/PPI Prophylaxis: lovenox   Disposition: admit for observation to SDU with telemetry.   History of Present Illness:  [name] is a 21 y.o. male past medical history of Prader-Willi syndrome and chronic resp failure who is presenting with 1 day of dysuria and "not acting right".  History is presented by patient and mother/godmother. Denies any fevers, chills, nausea, vomiting or diarrhea. Pain is described as moderate. Does endorse right upper quadrant pain, but this was documented as flank pain on the R side. Has not tried anything to relieve his symptoms. He is able to point to RUQ, but does not grimace with palpation.  Last BM yesterday and normal and he is stating that he is hungry. Additionally, had been on oxygen since last admission in August and was discharged with 1L O2. Had to increase to 3L three weeks ago to keep saturations above 90. Denies any fevers, chills, sputum production or CP.  No recent sick contacts.   In ED had UA that was WNL and CT abd showing physiologic distention of the gallbladder, hepatomegaly, non-inflamed pancreas and and RUQ [name] confirmed stones without pericholecystic fluid or thickening. CXR showed cardiomegaly. Placed on bipap for acute hypoxic resp failure. Patient reportedly comfortable during my exam.   Review Of Systems: Per HPI with the following additions:   ROS  Patient Active Problem List   Diagnosis Date Noted  . Hyperkalemia 03/05/2017  . Cough 03/05/2017  . Diastolic congestive heart failure ([place])   . Type 2 diabetes mellitus without complication, without long-term current use of insulin ([place])   . Conjunctivitis 08/22/2015  . Accidental overdose 02/22/2015  . Accidental lisinopril ingestion 02/22/2015  . Tremor bilateral hands 10/27/2014  . Diabetes mellitus type 2 in obese ([place]) 09/27/2014  . Shortness of breath   . Chronic respiratory failure ([place]) 09/15/2014  . Dyspnea   . Hypoventilation syndrome   .  Hypoxia   . SOB (shortness of breath)   . Intertrigo 06/30/2013  . Allergic rhinitis 01/07/2013  . Obesity hypoventilation syndrome ([place]) 10/05/2010  . VISUAL IMPAIRMENT 11/13/2009  . RAYNAUD'S SYNDROME 08/10/2009  . Essential hypertension, benign 01/07/2008  . ORTHOSTATIC PROTEINURIA 01/07/2008  . OBESITY, MORBID 02/27/2007  . 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 Asthma 10/16/2006  . SYMPTOM, ENURESIS, NOCTURNAL 10/16/2006    Past Medical History: Past Medical History:  Diagnosis Date  . Asthma   . Diabetes mellitus without complication ([place])   . Hypertension    Pulmomary htn  . Kidney dysfunction    left  . Obesity   . Obesity hypoventilation syndrome ([place])   . Prader-Willi syndrome   . Raynaud's phenomenon   . Sleep apnea     Past Surgical History: Past Surgical History:  Procedure Laterality Date  . NISSEN FUNDOPLICATION    . TONSILLECTOMY      Social History: Social History  Substance Use Topics  . Smoking status: Never Smoker  . Smokeless tobacco: Never Used  . Alcohol use No    Family History: No FH of genetic diseases   Allergies and Medications: No Known Allergies No current facility-administered medications on file prior to encounter.    Current Outpatient Prescriptions on File Prior to Encounter  Medication Sig Dispense Refill  . albuterol (PROVENTIL) (2.5 MG/3ML) 0.083% nebulizer solution inhale contents of 1 vial  in nebulizer once daily 75 mL 12  . cetirizine (ZYRTEC) 10 MG tablet Take 1 tablet (10 mg total) by mouth daily. 90 tablet 1  . desmopressin (DDAVP) 0.2 MG tablet Take 0.4 mg by mouth at bedtime.     . fluticasone (FLONASE) 50 MCG/ACT nasal spray instill 2 sprays into each nostril once daily 16 g 6  . fluticasone (FLOVENT HFA) 110 MCG/ACT inhaler Inhale 2 puffs into the lungs 2 (two) times daily. 1 Inhaler 12  . hydrochlorothiazide (HYDRODIURIL) 25 MG tablet Take 1 tablet (25 mg total) by mouth daily. 90 tablet 3  .  HYDROcodone-acetaminophen (HYCET) 7.5-325 mg/15 ml solution Take 15 mLs by mouth 4 (four) times daily as needed for moderate pain. 120 mL 0  . ibuprofen (ADVIL,MOTRIN) 200 MG tablet Take 600-800 mg by mouth every 6 (six) hours as needed for mild pain.    . metFORMIN (GLUCOPHAGE-XR) 500 MG 24 hr tablet take 1 tablet by mouth once daily WITH BREAKFAST 60 tablet 0  . montelukast (SINGULAIR) 10 MG tablet Take 1 tablet (10 mg total) by mouth at bedtime. 90 tablet 3  . olopatadine (PATANOL) 0.1 % ophthalmic solution Place 1 drop into both eyes 2 (two) times daily as needed for allergies.  0  . oxybutynin (DITROPAN) 5 MG tablet Take 1 tablet (5 mg total) by mouth at bedtime. (Patient taking differently: Take 10 mg by mouth at bedtime. ) 30 tablet 3  . PREVIDENT 5000 ENAMEL PROTECT 1.1-5 % PSTE Take 1 application by mouth 2 (two) times daily.   0  . PROAIR HFA 108 (90 Base) MCG/ACT inhaler inhale 2 puff by mouth every 6 hours if needed 8.5 Inhaler 0  . ACCU-CHEK AVIVA PLUS test strip as directed 100 each 12  . ACCU-CHEK SOFTCLIX LANCETS lancets Use as instructed 100 each 12  . amoxicillin-clavulanate (AUGMENTIN) 400-57 MG/5ML suspension 97m po bid (Patient not taking: Reported on 04/28/2017) 200 mL 0  . Lancets 30G MISC 1 Device by Does not apply route daily before breakfast. 100 each 3  . Misc. Devices MISC Please provide a pulse oximeter for the ear lobe. Patient has diagnosis of Raynaud, asthma 1 each 0  . Respiratory Therapy Supplies (NEBULIZER/ADULT MASK) KIT 1 Device by Does not apply route once. 1 each 0  . Respiratory Therapy Supplies MISC BiPap, titration setting 20/15 1 each 0  . Spacer/Aero-Holding Chambers (AEROCHAMBER MV) inhaler Use with albuterol inhaler 1 each 1    Objective: BP (!) 143/68   Pulse 99   Temp 98.4 F (36.9 C) (Oral)   Resp (!) 27   Ht 5' (1.524 m)   Wt (!) 387 lb 11.2 oz (175.9 kg)   SpO2 97%   BMI 75.72 kg/m  Exam: General: 21year old morbidly obese male sitting  up in bed BiPAP in place Eyes: EOMI, no scleral icterus ENTM: AT/[place], no nasal drainage Neck: supple, difficult to assess for adenopathy 2/2 body habitus Cardiovascular: distant heart sounds, but appears to be RRR Respiratory: NWOB, no accessory muscle use, breath sounds difficult to assess 2/2 body habitus, but no obvious wheezing, crackles or rhonchi Gastrointestinal: obese abdomen, soft, mildly tender in RUQ, no obvious distention or peritoneal signs MSK: no gross deformities or edema Derm: no new rashes  Neuro: AAOx3, cranial nerves grossly intact.  Psych: normal mood and affect  Labs and Imaging: CBC BMET   Recent Labs Lab 04/28/17 0321 04/28/17 0336  WBC 8.5  --   HGB 11.1* 13.9  HCT 39.5 41.0  PLT 162  --     Recent Labs Lab 04/28/17 0321 04/28/17 0336  NA 140 141  K 4.8 4.7  CL 98* 98*  CO2 32  --   BUN 33* 37*  CREATININE 1.15 1.20  GLUCOSE 110* 113*  CALCIUM 8.6*  --      Dg Chest Portable 1 View  Result Date: 04/28/2017 CLINICAL DATA:  Shortness of breath. EXAM: PORTABLE CHEST 1 VIEW COMPARISON:  Radiographs 02/26/2017 FINDINGS: Unchanged cardiomegaly and mediastinal contours. There is vascular congestion with probable perihilar edema. Small right pleural effusion on CT not well demonstrated radiographically. No pneumothorax. IMPRESSION: Unchanged cardiomegaly. Vascular congestion and mild perihilar edema appears similar to prior exam. Electronically Signed   By: Jeb Levering M.D.   On: 04/28/2017 03:50   Ct Renal Stone Study  Result Date: 04/28/2017 CLINICAL DATA:  Flank pain.  Stone disease suspected. EXAM: CT ABDOMEN AND PELVIS WITHOUT CONTRAST TECHNIQUE: Multidetector CT imaging of the abdomen and pelvis was performed following the standard protocol without IV contrast. COMPARISON:  Renal ultrasound 12/02/2006 FINDINGS: Lower chest: Patchy lung base opacities bilaterally. Trace right pleural effusion. Hepatobiliary: The liver is enlarged spanning 21 cm  cranial caudal. No evidence of focal lesion allowing for lack contrast. Gallbladder physiologically distended, no calcified stone. No biliary dilatation. Pancreas: No ductal dilatation or inflammation. Parenchymal atrophy of the head. Spleen: Normal in size without focal abnormality. Adrenals/Urinary Tract: No adrenal nodule, right adrenal gland not well-defined. Atrophic right kidney with compensatory hypertrophy of the left kidney. No left nephrolithiasis. No hydronephrosis. No ureteral stones. Urinary bladder is physiologically distended without wall thickening or stone. Stomach/Bowel: The stomach is nondistended. No bowel obstruction, wall thickening or inflammation. Umbilical hernia contains noninflamed nonobstructed: Marland Kitchen Small to moderate colonic stool burden. The appendix is not confidently visualized, no pericecal or right lower quadrant inflammation. Vascular/Lymphatic: Normal caliber abdominal aorta. No bulky adenopathy. Reproductive: Prostate is unremarkable. Other: No ascites or free air. Edema in the anterior abdominal wall and bilateral flanks likely secondary to habitus. Musculoskeletal: Transitional lumbosacral anatomy. There are no acute or suspicious osseous abnormalities. IMPRESSION: 1. No renal stones or obstructive uropathy. Atrophic right kidney with compensatory hypertrophy of the left kidney. 2. Umbilical hernia containing nonobstructed noninflamed colon. No bowel obstruction or inflammation. 3. Hepatomegaly. 4. Patchy opacities at the lung bases, may be atelectasis or pulmonary edema. Small right pleural effusion. Electronically Signed   By: Jeb Levering M.D.   On: 04/28/2017 03:02   US Abdomen Limited Ruq  Result Date: 04/28/2017 CLINICAL DATA:  Right upper quadrant pain EXAM: ULTRASOUND ABDOMEN LIMITED RIGHT UPPER QUADRANT COMPARISON:  CT abdomen pelvis 04/28/2017 FINDINGS: Gallbladder: There are multiple old stones within the gallbladder measuring up to 1.7 cm. The sonographic  Percell Miller sign could not be adequately assessed as the patient had received pain medication. He did report some mild to moderate pain over the gallbladder. Common bile duct: Diameter: 4 mm Liver: No focal lesion identified. Within normal limits in parenchymal echogenicity. Portal vein is patent on color Doppler imaging with normal direction of blood flow towards the liver. IMPRESSION: Cholelithiasis without wall thickening or pericholecystic fluid. No biliary dilatation. Electronically Signed   By: Ulyses Jarred M.D.   On: 04/28/2017 04:44    Eloise Levels, MD 04/28/2017, 8:09 AM PGY-2, [facility] pager: [phone], text pages welcome

## 2017-04-28 NOTE — ED Notes (Signed)
Blood pressure was taken when tremors in arms. Unable to obtain accurate BP

## 2017-04-28 NOTE — Progress Notes (Signed)
Pt placed back on bipap due to increased WOB, spo2 <85%. RT will continue to monitor

## 2017-04-28 NOTE — ED Notes (Signed)
Pt to u/s via stretcher, family with patient

## 2017-04-28 NOTE — Progress Notes (Signed)
Placed patient on BIPAP, vi aFFM per order. Patient wears at home HS.  Per family settings are 15/20.  RT placed patient on 20/8 to achieve VT >200.

## 2017-04-28 NOTE — ED Notes (Signed)
Admitting paged and notified about high blood pressure readings continuing to be high, only while patient is awake though.

## 2017-04-29 ENCOUNTER — Observation Stay (HOSPITAL_COMMUNITY): Payer: Medicaid Other

## 2017-04-29 ENCOUNTER — Ambulatory Visit: Payer: Medicaid Other | Admitting: Emergency Medicine

## 2017-04-29 DIAGNOSIS — N261 Atrophy of kidney (terminal): Secondary | ICD-10-CM | POA: Diagnosis present

## 2017-04-29 DIAGNOSIS — J45909 Unspecified asthma, uncomplicated: Secondary | ICD-10-CM | POA: Diagnosis present

## 2017-04-29 DIAGNOSIS — R300 Dysuria: Secondary | ICD-10-CM | POA: Diagnosis present

## 2017-04-29 DIAGNOSIS — J9621 Acute and chronic respiratory failure with hypoxia: Secondary | ICD-10-CM | POA: Diagnosis present

## 2017-04-29 DIAGNOSIS — Q871 Congenital malformation syndromes predominantly associated with short stature: Secondary | ICD-10-CM | POA: Diagnosis not present

## 2017-04-29 DIAGNOSIS — I110 Hypertensive heart disease with heart failure: Secondary | ICD-10-CM | POA: Diagnosis present

## 2017-04-29 DIAGNOSIS — R251 Tremor, unspecified: Secondary | ICD-10-CM | POA: Diagnosis present

## 2017-04-29 DIAGNOSIS — K8020 Calculus of gallbladder without cholecystitis without obstruction: Secondary | ICD-10-CM | POA: Diagnosis present

## 2017-04-29 DIAGNOSIS — Z6841 Body Mass Index (BMI) 40.0 and over, adult: Secondary | ICD-10-CM | POA: Diagnosis not present

## 2017-04-29 DIAGNOSIS — Z9989 Dependence on other enabling machines and devices: Secondary | ICD-10-CM | POA: Diagnosis not present

## 2017-04-29 DIAGNOSIS — I7300 Raynaud's syndrome without gangrene: Secondary | ICD-10-CM | POA: Diagnosis present

## 2017-04-29 DIAGNOSIS — Z7984 Long term (current) use of oral hypoglycemic drugs: Secondary | ICD-10-CM | POA: Diagnosis not present

## 2017-04-29 DIAGNOSIS — R0602 Shortness of breath: Secondary | ICD-10-CM | POA: Diagnosis not present

## 2017-04-29 DIAGNOSIS — Z79891 Long term (current) use of opiate analgesic: Secondary | ICD-10-CM | POA: Diagnosis not present

## 2017-04-29 DIAGNOSIS — E119 Type 2 diabetes mellitus without complications: Secondary | ICD-10-CM | POA: Diagnosis present

## 2017-04-29 DIAGNOSIS — K429 Umbilical hernia without obstruction or gangrene: Secondary | ICD-10-CM | POA: Diagnosis present

## 2017-04-29 DIAGNOSIS — R52 Pain, unspecified: Secondary | ICD-10-CM | POA: Diagnosis not present

## 2017-04-29 DIAGNOSIS — Z9981 Dependence on supplemental oxygen: Secondary | ICD-10-CM | POA: Diagnosis not present

## 2017-04-29 DIAGNOSIS — J9622 Acute and chronic respiratory failure with hypercapnia: Secondary | ICD-10-CM | POA: Diagnosis present

## 2017-04-29 DIAGNOSIS — I5032 Chronic diastolic (congestive) heart failure: Secondary | ICD-10-CM | POA: Diagnosis present

## 2017-04-29 DIAGNOSIS — G4733 Obstructive sleep apnea (adult) (pediatric): Secondary | ICD-10-CM | POA: Diagnosis present

## 2017-04-29 DIAGNOSIS — N200 Calculus of kidney: Secondary | ICD-10-CM | POA: Diagnosis present

## 2017-04-29 DIAGNOSIS — N3944 Nocturnal enuresis: Secondary | ICD-10-CM | POA: Diagnosis present

## 2017-04-29 LAB — CBC
HCT: 37.9 % — ABNORMAL LOW (ref 39.0–52.0)
Hemoglobin: 10.4 g/dL — ABNORMAL LOW (ref 13.0–17.0)
MCH: 21.1 pg — ABNORMAL LOW (ref 26.0–34.0)
MCHC: 27.4 g/dL — ABNORMAL LOW (ref 30.0–36.0)
MCV: 76.9 fL — ABNORMAL LOW (ref 78.0–100.0)
Platelets: 162 K/uL (ref 150–400)
RBC: 4.93 MIL/uL (ref 4.22–5.81)
RDW: 19.3 % — ABNORMAL HIGH (ref 11.5–15.5)
WBC: 6.5 K/uL (ref 4.0–10.5)

## 2017-04-29 LAB — HEPATITIS PANEL, ACUTE
HCV Ab: <0.1 {s_co_ratio} (ref 0.0–0.9)
Hep A IgM: NEGATIVE
Hep B C IgM: NEGATIVE
Hepatitis B Surface Ag: NEGATIVE

## 2017-04-29 LAB — BASIC METABOLIC PANEL WITH GFR
Anion gap: 9 (ref 5–15)
BUN: 25 mg/dL — ABNORMAL HIGH (ref 6–20)
CO2: 39 mmol/L — ABNORMAL HIGH (ref 22–32)
Calcium: 8.8 mg/dL — ABNORMAL LOW (ref 8.9–10.3)
Chloride: 95 mmol/L — ABNORMAL LOW (ref 101–111)
Creatinine, Ser: 0.92 mg/dL (ref 0.61–1.24)
GFR calc Af Amer: >60 mL/min
GFR calc non Af Amer: >60 mL/min
Glucose, Bld: 87 mg/dL (ref 65–99)
Potassium: 4.2 mmol/L (ref 3.5–5.1)
Sodium: 143 mmol/L (ref 135–145)

## 2017-04-29 LAB — GLUCOSE, CAPILLARY
GLUCOSE-CAPILLARY: 113 mg/dL — AB (ref 65–99)
GLUCOSE-CAPILLARY: 132 mg/dL — AB (ref 65–99)
Glucose-Capillary: 130 mg/dL — ABNORMAL HIGH (ref 65–99)

## 2017-04-29 MED ORDER — ENOXAPARIN SODIUM 80 MG/0.8ML ~~LOC~~ SOLN
80.0000 mg | SUBCUTANEOUS | Status: DC
  Administered 2017-04-29: 80 mg via SUBCUTANEOUS
  Filled 2017-04-29: qty 0.8

## 2017-04-29 MED ORDER — GI COCKTAIL ~~LOC~~
30.0000 mL | Freq: Once | ORAL | Status: AC
Start: 2017-04-29 — End: 2017-04-29
  Administered 2017-04-29: 30 mL via ORAL
  Filled 2017-04-29: qty 30

## 2017-04-29 MED ORDER — CALCIUM CARBONATE ANTACID 500 MG PO CHEW
800.0000 mg | CHEWABLE_TABLET | Freq: Three times a day (TID) | ORAL | Status: DC
  Administered 2017-04-29 – 2017-04-30 (×4): 800 mg via ORAL
  Filled 2017-04-29 (×4): qty 4

## 2017-04-29 NOTE — Progress Notes (Signed)
FPTS PMH and Care Coordination:   Discussed Param's course with mom, who would like more resources for [name]. She reports that she knows 2 other families with [name], and she has heard that one of these families had great success with a sleep away camp where weight was managed. She would also be interested in support groups. She speaks very highly of Dr. Violet Baldy (Medical Genetics at [facility]), who saw [name] until around age 57. She reports this is the last time they saw genetics. She would like to see Genetics back for planning future prognosis and interventions. I have called Medical Genetics and arranged for [name] to be set up with Dr. Violet Baldy again. I gave updated demographic info from our chart, and they report that the first available appt is Dec 17, 2018 9am. Scheduled for this. Also placed the family on the cancellation list. Office will put a message into the team (CSW or case manager) to reach out to the family with any possible resources like support groups.   Fax number is [phone]. Plan to fax most recent PCP note and d/c summary. Will need Tia or RN team to help [name] fax once patient is discharged. Will send message to PCP with FYI.   Loni Muse, MD

## 2017-04-29 NOTE — Discharge Summary (Signed)
[facility] Discharge Summary  Patient name: [name] Medical record number: [id number] Date of birth: 12-19-1995 Age: 21 y.o. Gender: male Date of Admission: 04/28/2017  Date of Discharge: 04/30/2017 Admitting Physician: Alveda Reasons, MD  Primary Care Provider: Mayo, Pete Pelt, MD Consultants: Pulmonary  Indication for Hospitalization:   Acute on Chronic Respiratory Failure  Discharge Diagnoses/Problem List:   Acute on Chronic Hypoxic Respiratory Failure Obesity Hypoventilation Syndrome Obstructive Sleep Apnea T2DM HTN Morbid Obesity  Disposition: home  Discharge Condition: stable  Discharge Exam:   General: Awake and Alert, NAD Cardiovascular: RRR, no murmurs, distant heart sounds Respiratory: CTAB, no wheezing, no crackles Abdomen: obese abdomen, non-distended, soft, RUQ and umbilical area TTP, +bs  Extremities: FROM in all extremities Skin: warm, dry, intact, no rashes  Brief Hospital Course:  Mr. Kodi Steil isa 21y/o male with a PMH of bradycardia Willie syndrome, chronic respiratory failure on home oxygen for the past month, morbid obesity, obesity hypoventilation syndrome, type 2 diabetes who presented to the ED with abdominal pain, dysuria, and difficulty breathing. At home he had stopped eating and was acting more "sleepy" and "not himself" according to mom. In the ED his ABG showed he was hypercapnic and he was put on Bipap, given duonebs, and magnesium. His chest x-ray showed vascular congestion and perihilar edema. He is thought to be in volume overload and he was diuresed with Lasix IV. He also got a RUQ ultrasound and it showed cholelithiasis. It is unclear whether or not this is the cause of his pain due to patient having a very high baseline pain tolerance. Patient had a negative Murphy sign. The patient stopped endorsing RUQ pain after admission and administration of Bipap and getting diuresis. However, he did complain of  being TTP in the RUQ on exam at different times. His dysuria self-resolved and his U/A was not impressive for a UTI. He as adequately diuresed over the course of 3 days and his respiratory status improved. His Bipap setting were adjusted and he was instructed to now wear it every time he sleeps, even for naps. He sats returned to normal and he was weaned to 2L [place] oxygen on discharge. His home Lasix was switched to [place]mide 5m for better oral absorption.  Issues for Follow Up:  1. It would be beneficial for TCampbell Stallto follow up with his genetist Dr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 2. Please consider contacting [facility] on behalf of Devynn for further options on controlling and losing weight to help improve his chronic medical condition of hypoventilation obesity syndrome. 3. Consider neurology consult for tremor.  4. Ensure follow up with Dr. [facility]amonte [facility]in pulmonary clinic for formal re-titration of BiPAP. Adjusted the Bipap setting to IPAP:20, EPAP: 8, back up respiratory rate: 10, FIO2: 35% while admitted. 5. While Gilmer was in the hospital he complained of RUQ abdominal pain intermittently. A right upper quadrant ultrasound showed gallstones >1.7cm in size but no signs of inflammation or gallbladder wall thickening. He remained afebrile and a normal WBC. Consider referral to general surgery if he continues to have symptoms of pain. He may be a candidate for ursodeoxycholic if he is not a candidate for surgery. 6. He was started on Torsemide 624mdaily to control his overall volume status. Case management is working toward getting them a better home scale. Going forward I think checking and following his respiratory status and O2 requirement will  be a better indicator of how well he is diuresing.   Significant Procedures:   9/24 - CXR: IMPRESSION: Unchanged cardiomegaly. Vascular congestion and mild perihilar edema appears  similar to prior exam.  9/24 - Abd U/S Limited RUQ: IMPRESSION: Unchanged cardiomegaly. Vascular congestion and mild perihilar edema appears similar to prior exam.  Significant Labs and Imaging:   Recent Labs Lab 04/28/17 0321 04/28/17 0336 04/29/17 0327  WBC 8.5  --  6.5  HGB 11.1* 13.9 10.4*  HCT 39.5 41.0 37.9*  PLT 162  --  162    Recent Labs Lab 04/28/17 0321 04/28/17 0336 04/28/17 1535 04/29/17 0327  NA 140 141  --  143  K 4.8 4.7  --  4.2  CL 98* 98*  --  95*  CO2 32  --   --  39*  GLUCOSE 110* 113*  --  87  BUN 33* 37*  --  25*  CREATININE 1.15 1.20 0.95 0.92  CALCIUM 8.6*  --   --  8.8*  MG  --   --  1.7  --   ALKPHOS 92  --   --   --   AST 23  --   --   --   ALT 19  --   --   --   ALBUMIN 3.0*  --   --   --    9/23 - U/A: small leukocytes, no nitrites, 0-5 squamous epithelial cells, protein >300  9/24 - Lipase: 24 9/24 - ABG: pH 7.302, CO2 81.7, Bicarb 40.4; repeat pH 7.348, CO2 79.3, Bicarb 43.6 9/24 - VBG: pH 7.305, CO2 81.2, Bicarb 40.4 9/24 - BNP: 189.8 9/24 - Hepatitis panel: negative 9/24 - Magnesium: 1.7 9/24 - Ionized Calcium: 1.00 9/25 - Ionized Serum Calcium: 4.7  Results/Tests Pending at Time of Discharge: None  Discharge Medications:  Allergies as of 04/30/2017   No Known Allergies     Medication List    STOP taking these medications   amoxicillin-clavulanate 400-57 MG/5ML suspension Commonly known as:  AUGMENTIN     TAKE these medications   ACCU-CHEK AVIVA PLUS test strip Generic drug:  glucose blood as directed   AEROCHAMBER MV inhaler Use with albuterol inhaler   calcium carbonate 500 MG chewable tablet Commonly known as:  TUMS - dosed in mg elemental calcium Chew 2 tablets (400 mg of elemental calcium total) by mouth 3 (three) times daily between meals as needed for indigestion or heartburn.   cetirizine 10 MG tablet Commonly known as:  ZYRTEC Take 1 tablet (10 mg total) by mouth daily.   desmopressin 0.2 MG  tablet Commonly known as:  DDAVP Take 0.4 mg by mouth at bedtime.   fluticasone 110 MCG/ACT inhaler Commonly known as:  FLOVENT HFA Inhale 2 puffs into the lungs 2 (two) times daily.   fluticasone 50 MCG/ACT nasal spray Commonly known as:  FLONASE instill 2 sprays into each nostril once daily   hydrochlorothiazide 25 MG tablet Commonly known as:  HYDRODIURIL Take 1 tablet (25 mg total) by mouth daily.   HYDROcodone-acetaminophen 7.5-325 mg/15 ml solution Commonly known as:  HYCET Take 15 mLs by mouth 4 (four) times daily as needed for moderate pain.   ibuprofen 200 MG tablet Commonly known as:  ADVIL,MOTRIN Take 600-800 mg by mouth every 6 (six) hours as needed for mild pain.   Lancets 30G Misc 1 Device by Does not apply route daily before breakfast.   ACCU-CHEK SOFTCLIX LANCETS lancets Use as instructed   metFORMIN  500 MG 24 hr tablet Commonly known as:  GLUCOPHAGE-XR take 1 tablet by mouth once daily WITH BREAKFAST   Misc. Devices Misc Please provide a pulse oximeter for the ear lobe. Patient has diagnosis of Raynaud, asthma   montelukast 10 MG tablet Commonly known as:  SINGULAIR Take 1 tablet (10 mg total) by mouth at bedtime.   Nebulizer/Adult Mask Kit 1 Device by Does not apply route once. What changed:  Another medication with the same name was changed. Make sure you understand how and when to take each.   Respiratory Therapy Supplies Misc BiPAP IPAP:20, EPAP: 8, back up respiratory rate: 10, FIO2: 35%. What changed:  additional instructions   olopatadine 0.1 % ophthalmic solution Commonly known as:  PATANOL Place 1 drop into both eyes 2 (two) times daily as needed for allergies.   oxybutynin 5 MG tablet Commonly known as:  DITROPAN Take 2 tablets (10 mg total) by mouth at bedtime.   pantoprazole 20 MG tablet Commonly known as:  PROTONIX Take 1 tablet (20 mg total) by mouth daily.   PREVIDENT 5000 ENAMEL PROTECT 1.1-5 % Pste Generic drug:  Sod  Fluoride-Potassium Nitrate Take 1 application by mouth 2 (two) times daily.   PROAIR HFA 108 (90 Base) MCG/ACT inhaler Generic drug:  albuterol inhale 2 puff by mouth every 6 hours if needed   albuterol (2.5 MG/3ML) 0.083% nebulizer solution Commonly known as:  PROVENTIL inhale contents of 1 vial in nebulizer once daily   torsemide 20 MG tablet Commonly known as:  DEMADEX Take 3 tablets (60 mg total) by mouth daily.            Durable Medical Equipment        Start     Ordered   05/01/17 1606  For home use only DME Bipap  Once    Comments:  Back up respiratory rate:10 FiO2: 35%  Question Answer Comment  Bleed in oxygen (LPM) 4L   Inspiratory pressure 20   Expiratory pressure 8      05/01/17 1605       Discharge Care Instructions        Start     Ordered   04/30/17 0000  oxybutynin (DITROPAN) 5 MG tablet  Daily at bedtime     04/30/17 1402   04/30/17 0000  torsemide (DEMADEX) 20 MG tablet  Daily     04/30/17 1402   04/30/17 0000  calcium carbonate (TUMS - DOSED IN MG ELEMENTAL CALCIUM) 500 MG chewable tablet  3 times daily between meals PRN     04/30/17 1402   04/30/17 0000  Respiratory Therapy Supplies MISC     04/30/17 1402   04/30/17 0000  Increase activity slowly     04/30/17 1402   04/30/17 0000  Diet - low sodium heart healthy     04/30/17 1402   04/30/17 0000  Discharge instructions    Comments:  See Dr. Brett Albino on Monday to check on how Espiridion is doing. We included his new BiPAP settings in his orders, but they are  IPAP:20, EPAP: 8, back up respiratory rate: 10, FIO2: 35%. Keep taking his weights and call [phone] if you have concerns about his breathing or weight increasing.   04/30/17 1402   04/30/17 0000  pantoprazole (PROTONIX) 20 MG tablet  Daily     04/30/17 1402      Discharge Instructions: Please refer to Patient Instructions section of EMR for full details.  Patient was counseled important signs and symptoms  that should prompt return to  medical care, changes in medications, dietary instructions, activity restrictions, and follow up appointments.   Follow-Up Appointments:   Nuala Alpha, DO 05/02/2017, 6:12 PM PGY-1, [place]

## 2017-04-29 NOTE — Progress Notes (Signed)
Name: [name] MRN: [id number] DOB: 08-17-1995    ADMISSION DATE:  04/28/2017 CONSULTATION DATE:  04/28/17  REFERRING MD :  Gwendolyn Grant  CHIEF COMPLAINT:  SOB   HISTORY OF PRESENT ILLNESS:  [name] is a 21 y.o. male with a PMH as outlined below including but not limited to Prader Willi syndrome and OSA / OHS with chronic hypoxic respiratory failure, on nocturnal BiPAP 20/15 + 4L/min O2.  He has seen Dr. Delton Coombes in the past but last visit was in November 2016.  He has apparently been on home O2 throughout the day at 1L/min since discharge from hospital in July. He had admission for AoC hypoxic respiratory failure and per his godmother, he has "just not been right" since then.  He [facility] to [facility] ED [facility]/24 with abdominal pain and possible dysuria. In addition, family states that he has had to turn oxygen up to 4L and he has been sleepier than usual.  In ED, ABG demonstrated AoC hypercapnic respiratory failure (7.30 / 82 / 73).  He was placed on BiPAP and PCCM was asked to see in consultation.  ABG after 10 hours on BiPAP without change (7.35 / 79 / 67).  Abdominal US also showed gallstones and pt is currently being managed conservatively.   SUBJECTIVE:  Much better this AM, breathing more comfortably.  -500 after  lasix documented; however, mom thinks it has been more than this. Thinks lasix is definitely helping.  Pt eager to get up and move around.  Off BiPAP now but was on all night.  VITAL SIGNS: Temp:  [97.8 F (36.6 C)-98.7 F (37.1 C)] 97.8 F (36.6 C) (09/25 0521) Pulse Rate:  [87-113] 87 (09/25 0444) Resp:  [17-32] 20 (09/25 0521) BP: (92-269)/(59-214) 132/92 (09/25 0521) SpO2:  [84 %-100 %] 100 % (09/25 0521) FiO2 (%):  [40 %] 40 % (09/24 2020) Weight:  [171.3 kg (377 lb 11.2 oz)] 171.3 kg (377 lb 11.2 oz) (09/25 0521)  PHYSICAL EXAMINATION: General: Young AA male, morbidly obese, resting in bed, in NAD. Neuro: A&O x 3, non-focal.  HEENT: [place]/AT. PERRL, sclerae  anicteric. Cardiovascular: RRR, no M/R/G.  Lungs: Respirations shallow and unlabored.  CTA bilaterally, No W/R/R. Abdomen: Morbidly obese, BS x 4, soft, NT/ND.  Musculoskeletal: No gross deformities, no edema.  Skin: Intact, warm, no rashes.    Recent Labs Lab 04/28/17 0321 04/28/17 0336 04/28/17 1535 04/29/17 0327  NA 140 141  --  143  K 4.8 4.7  --  4.2  CL 98* 98*  --  95*  CO2 32  --   --  39*  BUN 33* 37*  --  25*  CREATININE 1.15 1.20 0.95 0.92  GLUCOSE 110* 113*  --  87    Recent Labs Lab 04/28/17 0321 04/28/17 0336 04/29/17 0327  HGB 11.1* 13.9 10.4*  HCT 39.5 41.0 37.9*  WBC 8.5  --  6.5  PLT 162  --  162   Dg Chest Portable 1 View  Result Date: 04/28/2017 CLINICAL DATA:  Shortness of breath. EXAM: PORTABLE CHEST 1 VIEW COMPARISON:  Radiographs 02/26/2017 FINDINGS: Unchanged cardiomegaly and mediastinal contours. There is vascular congestion with probable perihilar edema. Small right pleural effusion on CT not well demonstrated radiographically. No pneumothorax. IMPRESSION: Unchanged cardiomegaly. Vascular congestion and mild perihilar edema appears similar to prior exam. Electronically Signed   By: Rubye Oaks M.D.   On: 04/28/2017 03:50   Ct Renal Stone Study  Result Date: 04/28/2017 CLINICAL DATA:  Flank  pain.  Stone disease suspected. EXAM: CT ABDOMEN AND PELVIS WITHOUT CONTRAST TECHNIQUE: Multidetector CT imaging of the abdomen and pelvis was performed following the standard protocol without IV contrast. COMPARISON:  Renal ultrasound 12/02/2006 FINDINGS: Lower chest: Patchy lung base opacities bilaterally. Trace right pleural effusion. Hepatobiliary: The liver is enlarged spanning 21 cm cranial caudal. No evidence of focal lesion allowing for lack contrast. Gallbladder physiologically distended, no calcified stone. No biliary dilatation. Pancreas: No ductal dilatation or inflammation. Parenchymal atrophy of the head. Spleen: Normal in size without focal  abnormality. Adrenals/Urinary Tract: No adrenal nodule, right adrenal gland not well-defined. Atrophic right kidney with compensatory hypertrophy of the left kidney. No left nephrolithiasis. No hydronephrosis. No ureteral stones. Urinary bladder is physiologically distended without wall thickening or stone. Stomach/Bowel: The stomach is nondistended. No bowel obstruction, wall thickening or inflammation. Umbilical hernia contains noninflamed nonobstructed: Marland Kitchen Small to moderate colonic stool burden. The appendix is not confidently visualized, no pericecal or right lower quadrant inflammation. Vascular/Lymphatic: Normal caliber abdominal aorta. No bulky adenopathy. Reproductive: Prostate is unremarkable. Other: No ascites or free air. Edema in the anterior abdominal wall and bilateral flanks likely secondary to habitus. Musculoskeletal: Transitional lumbosacral anatomy. There are no acute or suspicious osseous abnormalities. IMPRESSION: 1. No renal stones or obstructive uropathy. Atrophic right kidney with compensatory hypertrophy of the left kidney. 2. Umbilical hernia containing nonobstructed noninflamed colon. No bowel obstruction or inflammation. 3. Hepatomegaly. 4. Patchy opacities at the lung bases, may be atelectasis or pulmonary edema. Small right pleural effusion. Electronically Signed   By: Rubye Oaks M.D.   On: 04/28/2017 03:02   US Abdomen Limited Ruq  Result Date: 04/28/2017 CLINICAL DATA:  Right upper quadrant pain EXAM: ULTRASOUND ABDOMEN LIMITED RIGHT UPPER QUADRANT COMPARISON:  CT abdomen pelvis 04/28/2017 FINDINGS: Gallbladder: There are multiple old stones within the gallbladder measuring up to 1.7 cm. The sonographic Eulah Pont sign could not be adequately assessed as the patient had received pain medication. He did report some mild to moderate pain over the gallbladder. Common bile duct: Diameter: 4 mm Liver: No focal lesion identified. Within normal limits in parenchymal echogenicity. Portal  vein is patent on color Doppler imaging with normal direction of blood flow towards the liver. IMPRESSION: Cholelithiasis without wall thickening or pericholecystic fluid. No biliary dilatation. Electronically Signed   By: Deatra Robinson M.D.   On: 04/28/2017 04:44    STUDIES:  CXR 9/24 > vascular congestion. CT A / P 9/24 > no renal stones or obstructive uropathy.  Atrophic right kidney with compensatory hypertrophy of the left kidney. Hepatomegaly. RUQ [name] 9/24 > cholelithiasis without wall thickening or pericholecystic fluid.  No biliary dilatation.  SIGNIFICANT EVENTS  9/24 > admit.  ASSESSMENT / PLAN:  AoC hypoxemic and hypercapnic respiratory failure - with known underlying OSA / OHS.  Recently discharged with 1L O2/min throughout the day.  Repeat ABG after 10 hours with no change in pCO2 and pH remains normal (chronic hypercapnia). Acute pulmonary edema. Hx OSA / OHS (on nocturnal BiPAP 20/15 with 4L O2 / min bleed in), asthma. Plan: Avoid sedating meds. Continue nocturnal BiPAP with PRN daytime. Once off BiPAP, continue supplemental O2 to maintain SpO2 > 90%. Continue lasix. Bronchial hygiene. Continue weight loss / exercise efforts. CXR intermittently. PT consult for mobility.   Rutherford Guys, [place] [facility] Pager: [phone]  or [phone] 04/29/2017, 8:27 AM

## 2017-04-29 NOTE — Progress Notes (Signed)
Nutrition Consult  Received MD Consult due to patient being morbidly obese. Body mass index is 73.76 kg/m. Class 3, extreme/morbid obesity  Spoke with patient and one of his caregivers (Godmother). She reports that she prepares patient's lunch and dinner meals and his mom prepares his breakfast and snacks. Godmother is stricter on types of food and calorie content of foods than his mother. He usually gets green vegetables and a meat for lunch and dinner. Carbohydrate intake is limited by Godmother, but Mother allows more carbohydrates with breakfast and snacks. They have been working on weight loss with patient; he lost ~17 lbs in ~3 weeks at home PTA. Godmother is very dedicated to helping patient lose weight so he can be healthier. We discussed ways to incorporate complex carbohydrates, whole grains, and fiber into his diet, while still maintaining a low sodium 1200 calorie per day diet. Godmother was very appreciative of RD visit. Reviewed hospital menu with her and encouraged her to choose meals for patient to follow his home diet and continue his weight loss.   Joaquin Courts, RD, LDN, CNSC Pager [phone] After Hours Pager [phone]

## 2017-04-29 NOTE — Evaluation (Addendum)
Physical Therapy Evaluation Patient Details Name: [name] MRN: [id number] DOB: 1996/04/08 Today's Date: 04/29/2017   History of Present Illness  Pt is a 21 y/o male admitted secondary to acute respiratory failure. PMH includes prader willi syndrome, pulmonary HTN, DM, HTN, asthma, and obesity hypoventilation syndrome.   Clinical Impression  Pt admitted with above diagnosis. Pt currently with functional limitations due to the deficits listed below (see PT Problem List). Pt is currently modA for bed mobility and minA for transfers and ambulation of 100 feet with hand hold assist. Pt mobility is limited by his cardiovascular response to activity with oxygen desaturation and heart rate increase. Pt currently is using supplemental oxygen during the day and would like to get back to walking and swimming for exercise. Pt would benefit from skilled PT to encourage exercise with energy conservation techniques and training in use of supplemental oxygen to increase their independence and safety with mobility to allow discharge to the venue listed below.       Follow Up Recommendations Outpatient PT;Supervision/Assistance - 24 hour    Equipment Recommendations  None recommended by PT       Precautions / Restrictions Precautions Precautions: None Restrictions Weight Bearing Restrictions: No      Mobility  Bed Mobility Overal bed mobility: Needs Assistance Bed Mobility: Supine to Sit     Supine to sit: Mod assist     General bed mobility comments: modA for managment of LE to floor, trunk to upright and pad scoot of hips to EoB  Transfers Overall transfer level: Needs assistance Equipment used: None Transfers: Sit to/from Stand Sit to Stand: Min assist;From elevated surface         General transfer comment: minA for blocking of feet and steadying in standing   Ambulation/Gait Ambulation/Gait assistance: Min assist Ambulation Distance (Feet): 100 Feet (2x50 feet) Assistive  device: None Gait Pattern/deviations: Step-through pattern;Decreased step length - right;Decreased step length - left;Shuffle Gait velocity: WFL Gait velocity interpretation: >2.62 ft/sec, indicative of independent community ambulator General Gait Details: minA for steadying, shuffling gait pattern due to decreased ROM from body habitus,         Balance Overall balance assessment: Needs assistance Sitting-balance support: No upper extremity supported;Feet supported Sitting balance-Leahy Scale: Fair     Standing balance support: No upper extremity supported;During functional activity Standing balance-Leahy Scale: Fair Standing balance comment: able to steady in static balance without support                             Pertinent Vitals/Pain Pain Assessment: No/denies pain    Home Living Family/patient expects to be discharged to:: Private residence Living Arrangements: Parent Available Help at Discharge: Family;Available 24 hours/day;Personal care attendant Type of Home: House Home Access: Stairs to enter Entrance Stairs-Rails: Right Entrance Stairs-Number of Steps: 5 Home Layout: One level Home Equipment: Wheelchair - power;Hospital bed Additional Comments: Personal care attendant available during day when mom isn't home     Prior Function Level of Independence: Needs assistance   Gait / Transfers Assistance Needed: Independent with ambulating at home. Has power chair but aunt reports he doesn't need. Needs help with bed mobility.   ADL's / Homemaking Assistance Needed: Needs assist with ADLs        Hand Dominance   Dominant Hand: Right    Extremity/Trunk Assessment   Upper Extremity Assessment Upper Extremity Assessment: Generalized weakness    Lower Extremity Assessment Lower Extremity Assessment: Generalized  weakness;RLE deficits/detail;LLE deficits/detail RLE Deficits / Details: ROM limited by body habitus, strength grossly 4/5 LLE Deficits /  Details: ROM limited by body habitus, strength grossly 4/5       Communication   Communication: No difficulties  Cognition Arousal/Alertness: Awake/alert Behavior During Therapy: WFL for tasks assessed/performed Overall Cognitive Status: History of cognitive impairments - at baseline                                        General Comments General comments (skin integrity, edema, etc.): Pt on 3L O2 via nasal cannula, SaO2 at rest 100%O2, with ambulation SaO2 dropped to 75%O2 and HR increased to 140 bpm with 3/4 dyspnea after 50 feet ambulation, pt instructed in pursed lipped breathing and increased breathing in through nose to maximize supplemental oxygen use, O2 increased to 4L via nasal cannula and SaO2 rebounded to 90%O2, pt encouraged to slow down gait so he would have less DoE, with maximal vc pt able to slightly slow his gait and improve breathing through his nose however SaO2 still dropped to 85%O2 and HR reached 132bpm by the time he returned to his room        Assessment/Plan    PT Assessment Patient needs continued PT services  PT Problem List Decreased strength;Decreased range of motion;Decreased activity tolerance;Decreased mobility;Cardiopulmonary status limiting activity       PT Treatment Interventions DME instruction;Gait training;Stair training;Functional mobility training;Therapeutic activities;Therapeutic exercise;Balance training;Cognitive remediation;Patient/family education;Other (comment) (energy conservation techniques )    PT Goals (Current goals can be found in the Care Plan section)  Acute Rehab PT Goals Patient Stated Goal[place] [facility] [place] [facility] PT Goal Formulation: With patient Time For Goal Achievement: 05/13/17 Potential to Achieve Goals: Good    Frequency Min 3X/week    AM-PAC PT "6 Clicks" Daily Activity  Outcome Measure Difficulty turning over in bed (including adjusting bedclothes, sheets and blankets)?: Unable Difficulty moving  from lying on back to sitting on the side of the bed? : Unable Difficulty sitting down on and standing up from a chair with arms (e.g., wheelchair, bedside commode, etc,.)?: Unable Help needed moving to and from a bed to chair (including a wheelchair)?: A Little Help needed walking in hospital room?: A Little Help needed climbing 3-5 steps with a railing? : A Lot 6 Click Score: 11    End of Session Equipment Utilized During Treatment: Oxygen Activity Tolerance: Treatment limited secondary to medical complications (Comment) Patient left: in chair;with call bell/phone within reach;with family/visitor present Nurse Communication: Mobility status PT Visit Diagnosis: Other abnormalities of gait and mobility (R26.89);Muscle weakness (generalized) (M62.81);Difficulty in walking, not elsewhere classified (R26.2)    Time: 0981-1914 PT Time Calculation (min) (ACUTE ONLY): 34 min   Charges:   PT Evaluation $PT Eval Moderate Complexity: 1 Mod PT Treatments $Gait Training: 8-22 mins   PT G Codes:   PT G-Codes **NOT FOR INPATIENT CLASS** Functional Assessment Tool Used: AM-PAC 6 Clicks Basic Mobility Functional Limitation: Mobility: Walking and moving around Mobility: Walking and Moving Around Current Status (N8295): At least 60 percent but less than 80 percent impaired, limited or restricted Mobility: Walking and Moving Around Goal Status [phone]): At least 1 percent but less than 20 percent impaired, limited or restricted    Lanora Manis B. Beverely Risen PT, DPT Acute Rehabilitation  [phone] Pager [phone]    Elon Alas Fleet 04/29/2017, 3:49  PM   

## 2017-04-29 NOTE — Progress Notes (Signed)
RT called to bedside due to concern from patient mother that patient was uncomfortable on AVAPS mode on BIPAP. RT explained to mother this mode was ideal for patient due to his history and low tidal volumes and that we were trying to help him blow off more CO2 considering it has been high. Mother states she wanted patient switched back over to regular BIPAP mode of 20/8 because she felt he was able to tolerate better. Mother stated if sons CO2 remained high she would allow dayshift RT to switch back to AVAPS mode so we could deliver a set tidal volume, and per patient mother she would be able to watch him better. During BIPAP check tidal volumes are less than 200. RT notified RN of situation.

## 2017-04-30 DIAGNOSIS — R0602 Shortness of breath: Secondary | ICD-10-CM

## 2017-04-30 DIAGNOSIS — R52 Pain, unspecified: Secondary | ICD-10-CM

## 2017-04-30 LAB — BASIC METABOLIC PANEL
Anion gap: 11 (ref 5–15)
BUN: 24 mg/dL — AB (ref 6–20)
CO2: 41 mmol/L — AB (ref 22–32)
CREATININE: 1.03 mg/dL (ref 0.61–1.24)
Calcium: 8.5 mg/dL — ABNORMAL LOW (ref 8.9–10.3)
Chloride: 89 mmol/L — ABNORMAL LOW (ref 101–111)
GFR calc Af Amer: >60 mL/min (ref >60)
GLUCOSE: 85 mg/dL (ref 65–99)
Potassium: 4.1 mmol/L (ref 3.5–5.1)
Sodium: 141 mmol/L (ref 135–145)

## 2017-04-30 LAB — CBC
HCT: 37 % — ABNORMAL LOW (ref 39.0–52.0)
HEMOGLOBIN: 10.4 g/dL — AB (ref 13.0–17.0)
MCH: 21.4 pg — AB (ref 26.0–34.0)
MCHC: 28.1 g/dL — AB (ref 30.0–36.0)
MCV: 76.1 fL — ABNORMAL LOW (ref 78.0–100.0)
Platelets: 147 10*3/uL — ABNORMAL LOW (ref 150–400)
RBC: 4.86 MIL/uL (ref 4.22–5.81)
RDW: 18.6 % — AB (ref 11.5–15.5)
WBC: 6.7 10*3/uL (ref 4.0–10.5)

## 2017-04-30 LAB — CALCIUM, IONIZED: CALCIUM, IONIZED, SERUM: 4.7 mg/dL (ref 4.5–5.6)

## 2017-04-30 LAB — GLUCOSE, CAPILLARY
GLUCOSE-CAPILLARY: 135 mg/dL — AB (ref 65–99)
Glucose-Capillary: 94 mg/dL (ref 65–99)

## 2017-04-30 MED ORDER — TORSEMIDE 20 MG PO TABS
60.0000 mg | ORAL_TABLET | Freq: Every day | ORAL | Status: DC
  Administered 2017-04-30: 60 mg via ORAL
  Filled 2017-04-30: qty 3

## 2017-04-30 MED ORDER — CALCIUM CARBONATE ANTACID 500 MG PO CHEW: 2.0000 | CHEWABLE_TABLET | Freq: Three times a day (TID) | ORAL | 3 refills | Status: DC | PRN

## 2017-04-30 MED ORDER — PANTOPRAZOLE SODIUM 20 MG PO TBEC: 20.0000 mg | DELAYED_RELEASE_TABLET | Freq: Every day | ORAL | 2 refills | Status: DC

## 2017-04-30 MED ORDER — OXYBUTYNIN CHLORIDE 5 MG PO TABS: 10.0000 mg | ORAL_TABLET | Freq: Every day | ORAL | 0 refills | Status: DC

## 2017-04-30 MED ORDER — TORSEMIDE 20 MG PO TABS: 60.0000 mg | ORAL_TABLET | Freq: Every day | ORAL | 0 refills | Status: DC

## 2017-04-30 MED ORDER — RESPIRATORY THERAPY SUPPLIES MISC: 0 refills | Status: AC

## 2017-04-30 NOTE — Progress Notes (Signed)
Patient's BIPAP setting for hospital use are as follows; IPAP:20, EPAP: 8, back up respiratory rate: 10, FIO2: 35%.

## 2017-04-30 NOTE — Care Management Note (Addendum)
Case Management Note Donn Pierini RN, BSN Unit 4E-Case Manager [phone]  Patient Details  Name: [name] MRN: [id number] Date of Birth: 1995-09-10  Subjective/Objective:   Pt admitted with abd pain               Action/Plan: PTA pt lived at home with mom- has home bipap and home 02 with [facility]- mom requesting info on new scale for home- info provided to mom on places that have scales that go over 400 lbs- pt will also go home on new bipap settings- spoke with rounding team and confirmed settings- notified Lawson Fiscal with [facility] that pt had new bipap settings for home- she is to send new settings to main office at [facility] for f/u.   Expected Discharge Date:  04/30/17               Expected Discharge Plan:  Home/Self Care  In-House Referral:     Discharge planning Services  CM Consult  Post Acute Care Choice:  NA, Durable Medical Equipment Choice offered to:  Parent  DME Arranged:  Bipap DME Agency:  [facility]  HH Arranged:    [facility] Agency:     Status of Service:  Completed, signed off  If discussed at Long Length of Stay Meetings, dates discussed:    Discharge Disposition: home/self care   Additional Comments:  05/01/17- 1500- Donn Pierini RN, CM- received call post discharge from [facility]- regarding pt's BIPAP orders- apparently [facility] needs new BIPAP order with bleed in 02 LPM rate- call made to [facility] team- spoke with Dr. Linwood Dibbles who is familiar with pt- she placed new DME order for BIPAP- (verified liter flow with pulm. On call for 35%)- spoke with Brad with [facility] who is to f/u on this and making sure new order gets to [facility] for verification.  Darrold Span, RN 04/30/2017, 4:41 PM

## 2017-04-30 NOTE — Progress Notes (Addendum)
Name: [name] MRN: [id number] DOB: 07-13-1996    ADMISSION DATE:  04/28/2017 CONSULTATION DATE:  04/28/17  REFERRING MD :  Gwendolyn Grant  CHIEF COMPLAINT:  SOB   HISTORY OF PRESENT ILLNESS:  [name] is a 21 y.o. male with a PMH as outlined below including but not limited to Prader Willi syndrome and OSA / OHS with chronic hypoxic respiratory failure, on nocturnal BiPAP 20/15 + 4L/min O2.  He has seen Dr. Delton Coombes in the past but last visit was in November 2016.  He has apparently been on home O2 throughout the day at 1L/min since discharge from hospital in July. He had admission for AoC hypoxic respiratory failure and per his godmother, he has "just not been right" since then.  He presented to [facility] ED 9/24 with abdominal pain and possible dysuria. In addition, family states that he has had to turn oxygen up to 4L and he has been sleepier than usual.  In ED, ABG demonstrated AoC hypercapnic respiratory failure (7.30 / 82 / 73).  He was placed on BiPAP and PCCM was asked to see in consultation.  ABG after 10 hours on BiPAP without change (7.35 / 79 / 67).  Abdominal US also showed gallstones and pt is currently being managed conservatively.  SUBJECTIVE:   Feels better. Off bipap today AM In chair by bedside, awake, interactive  VITAL SIGNS: Temp:  [98.1 F (36.7 C)-99.1 F (37.3 C)] 98.4 F (36.9 C) (09/26 0323) Pulse Rate:  [101-113] 108 (09/26 0040) Resp:  [20-27] 20 (09/26 0323) BP: (103-135)/(80-108) 103/83 (09/26 0323) SpO2:  [95 %-100 %] 99 % (09/26 0323) Weight:  [372 lb 6.4 oz (168.9 kg)] 372 lb 6.4 oz (168.9 kg) (09/26 0323)  PHYSICAL EXAMINATION: Gen:      No acute distress, pleasant HEENT:  EOMI, sclera anicteric Neck:     No masses; no thyromegaly Lungs:    Clear to auscultation bilaterally; normal respiratory effort CV:         Regular rate and rhythm; no murmurs Abd:      + bowel sounds; soft, non-tender; no palpable masses, no distension Ext:    2+ edema; adequate  peripheral perfusion Skin:      Warm and dry; no rash Neuro: alert and oriented x 3   Recent Labs Lab 04/28/17 0321 04/28/17 0336 04/28/17 1535 04/29/17 0327 04/30/17 0408  NA 140 141  --  143 141  K 4.8 4.7  --  4.2 4.1  CL 98* 98*  --  95* 89*  CO2 32  --   --  39* 41*  BUN 33* 37*  --  25* 24*  CREATININE 1.15 1.20 0.95 0.92 1.03  GLUCOSE 110* 113*  --  87 85    Recent Labs Lab 04/28/17 0321 04/28/17 0336 04/29/17 0327 04/30/17 0408  HGB 11.1* 13.9 10.4* 10.4*  HCT 39.5 41.0 37.9* 37.0*  WBC 8.5  --  6.5 6.7  PLT 162  --  162 147*   Dg Chest 1 View  Result Date: 04/29/2017 CLINICAL DATA:  Shortness of breath. History of hypertension diabetes and obesity. Prader-Will syndrome. EXAM: CHEST 1 VIEW COMPARISON:  Portable chest x-ray of April 28, 2017 FINDINGS: The study is quite limited due to scatter effects from overlying soft tissues. The lungs are mildly hypoinflated. The cardiac silhouette remains enlarged. The pulmonary vascularity remains engorged and indistinct. The hemidiaphragms are slightly better visualized today. IMPRESSION: Possible slight interval improvement in the appearance of the pulmonary interstitium. Persistent  CHF with interstitial edema. Electronically Signed   By: David  [place] M.D.   On: 04/29/2017 13:36    STUDIES:  CXR 9/24 > vascular congestion. CT A / P 9/24 > no renal stones or obstructive uropathy.  Atrophic right kidney with compensatory hypertrophy of the left kidney. Hepatomegaly. RUQ [name] 9/24 > cholelithiasis without wall thickening or pericholecystic fluid.  No biliary dilatation.  SIGNIFICANT EVENTS  9/24 > admit.  ASSESSMENT / PLAN:  AoC hypoxemic and hypercapnic respiratory failure - with known underlying OSA / OHS.  Recently discharged with 1L O2/min throughout the day.  Repeat ABG after 10 hours with no change in pCO2 and pH remains normal (chronic hypercapnia). Acute pulmonary edema. Hx OSA / OHS (on home setting of  nocturnal BiPAP 20/15 with 4L O2 / min bleed in), asthma. Plan: Adjusted the Bipap setting to IPAP:20, EPAP: 8, back up respiratory rate: 10, FIO2: 35%.  He is tolerating it well with no desats at night. Recommend sending him home on these setting He will need a formal Bipap re titration as outpatient and follow up with Dr. Delton Coombes in pulmonary clinic (we will arrange this) Continue working on weight loss. Continue diuresis  PCCM will be available as needed during this admission. Please call with any questions. Discussed with mother at bedside.  Chilton Greathouse MD [facility] Pager [phone] If no answer or after 3pm call: [phone] 04/30/2017, 9:43 AM

## 2017-04-30 NOTE — Discharge Instructions (Signed)
Mr [name] was hospitalized for acute on chronic respiratory failure due to increased fluid and obesity hypoventilation syndrome. He progressed well and responded to the diuresis and BiPap treatments. It is important for him to have close follow up care after being hospitalized. Please make sure to follow up with his PCP at [facility] after he gets home. While he was hospitalized his Bipap settings were adjusted to  IPAP:20, EPAP: 8, back up respiratory rate: 10, FIO2: 35%. Please use these settings at home when he sleeps and takes naps. Please continue to give him his new oral diuretic medication Torsemide  daily.  Consider getting him a follow up appointment with general surgery to assess his gall bladder stones and get their recommendations on treatment.  Consider getting him a neurology appointment to have further workup on his bilateral hand tremors.

## 2017-04-30 NOTE — Progress Notes (Signed)
Family Medicine Teaching Service Daily Progress Note Intern Pager: [phone]  Patient name: [name] Medical record number: [id number] Date of birth: 1995-09-01 Age: 21 y.o. Gender: male  Primary Care Provider: Mayo, Allyn Kenner, MD Consultants: Pulmonology/Critical Care Code Status: FULL  Pt Overview and Major Events to Date:   9/24 - CXR 9/24 - RUQ Abd U/S  Assessment and Plan:  Acute on chronic respiratory failure: Histroy of OSA and Obesity Hypoventilation Syndrome -Patient has been noted to be hypercarbic on both venous and arterial blood gas however he appears to be compensated well and this is a chronic issue that is improving. He is currently on BiPAP at night and during naps. He also has a home oxygen requirement since his previous admission in July 2018. He is morbidly obese with history of OHS and OSA.  Recent echo in July showing preserved ejection fraction and mild dilation of RV. - Weaning O2 down to 3L today, continue to wean per pulmonology; maintain O2 sats above 90% - Switch to PO Torsemide  daily. Monitor I/Os to ensure pt has good urine output. - Weight down 15lbs since admission; I/Os this morning had 1.35L output with condom cath on - Continue home dose of HCTZ  - Bipap at bed time and during naps - Albuterol q2 PRN - Continue home medications of flonase, loratadine, and montelukast - Duoneb q6 PRN  RUQ Abdominal pain: Returned. Abdominal ultrasound did show gallstones. It is possible this is pain from his cholelithiasis. Abdominal pain has returned this morning; consider consult to surgery for elective cholecystectomy. Hepatitis panel negative. - Consider outpatient referral to general surgery for cholecystectomy.  - Tylenol for pain PRN - Protonix  daily - GI cocktail PRN  Hypocalcemia: Serum ionized calcium is 4.7mg /dL.  Serum Ca this am is 8.5. Patient does not need further workup at this time as his hypocalcemia is unlikely the cause of his  bilateral hand tremors. - Continue calcium carbonate  tab TID  Bilateral resting hand tremors: Resolved. 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 Possibly essential tremor as no bradykinesia or postural instability is noted. Parkinsonian tremor highly unlikely at someone of this age, clinical presentation does not fit. - Calcium was given and this am ionized serum Ca was normal at 4.7. - Consider outpatient neurology consult for further workup.  Dysuria: Resolved. Seems to be an intermittent issue as patient had endorsed pain on urination evening of 9/24. He did have small number of leukocytes on U/A otherwise unremarkable. - Small leukocytes but otherwise no real clear indication of infection on urinalysis. - Continue to monitor  T2DM: chronic, stable Glucose of 110 on admission. A1C on 7/18 6.6.  On metformin 500 mg XR at home. -sensitive SSI -qACHS CBGs   Enuresis: chronic, stable Mother reports nighttime enuresis -continue home oxybutynin at bedtime  -continue home desmopressin   HTN: liable but overall controlled. Suspect that pain and current issues are contributing to his transient increase in BP. Well controlled at last office visit on 03/10/17 with HCTZ.  -continue home hydrochlorothiazide  - will continue to monitor  Morbid Obesity: Secondary toPrader-Willi syndrome -limit to 1200 kcal a day -Nutrition consult  FEN/GI: Carb modified/PPI PPx: Lovenox  Disposition: Telemetry  Subjective:  Pt states this am he feels good and is doing well with BiPap at night and during nights. He continues to diuresis well as he had 1.3L of  output this morning. Condom cath in place. He does endorse RUQ abdominal pain today which is worse on palpation. He overall does not seem uncomfortable and is still eating his meals but mom states he has a high pain threshold. He has no  other complaints this morning. Denies any difficulty breathing on 3L O2 [place].  Objective: Temp:  [98.1 F (36.7 C)-99.1 F (37.3 C)] 98.4 F (36.9 C) (09/26 0323) Pulse Rate:  [101-113] 108 (09/26 0040) Resp:  [20-27] 20 (09/26 0323) BP: (103-135)/(80-108) 103/83 (09/26 0323) SpO2:  [95 %-100 %] 99 % (09/26 0323) Weight:  [372 lb 6.4 oz (168.9 kg)] 372 lb 6.4 oz (168.9 kg) (09/26 0323) Physical Exam: General: Awake and Alert, NAD Cardiovascular: RRR, no murmurs, distant heart sounds Respiratory: CTAB, no wheezing, no crackles Abdomen: obese abdomen, non-distended, soft, RUQ and umbilical area TTP, +bs  Extremities: FROM in all extremities Skin: warm, dry, intact, no rashes  Laboratory:  Recent Labs Lab 04/28/17 0321 04/28/17 0336 04/29/17 0327 04/30/17 0408  WBC 8.5  --  6.5 6.7  HGB 11.1* 13.9 10.4* 10.4*  HCT 39.5 41.0 37.9* 37.0*  PLT 162  --  162 147*    Recent Labs Lab 04/28/17 0321 04/28/17 0336 04/28/17 1535 04/29/17 0327 04/30/17 0408  NA 140 141  --  143 141  K 4.8 4.7  --  4.2 4.1  CL 98* 98*  --  95* 89*  CO2 32  --   --  39* 41*  BUN 33* 37*  --  25* 24*  CREATININE 1.15 1.20 0.95 0.92 1.03  CALCIUM 8.6*  --   --  8.8* 8.5*  PROT 7.5  --   --   --   --   BILITOT 0.8  --   --   --   --   ALKPHOS 92  --   --   --   --   ALT 19  --   --   --   --   AST 23  --   --   --   --   GLUCOSE 110* 113*  --  87 85   9/23 - U/A: small leukocytes, no nitrites, 0-5 squamous epithelial cells, protein >300  9/24 - Lipase: 24 9/24 - ABG: pH 7.302, CO2 81.7, Bicarb 40.4; repeat pH 7.348, CO2 79.3, Bicarb 43.6 9/24 - VBG: pH 7.305, CO2 81.2, Bicarb 40.4 9/24 - BNP: 189.8 9/24 - Hepatitis panel: negative 9/24 - Magnesium: 1.7 9/24 - Ionized Calcium: 1.00 9/25 - Ionized Serum Calcium: 4.7  Imaging/Diagnostic Tests:  9/24 - CXR: IMPRESSION: Unchanged cardiomegaly. Vascular congestion and mild perihilar edema appears similar to prior exam.  9/24 - Abd U/S  Limited RUQ: IMPRESSION: Unchanged cardiomegaly. Vascular congestion and mild perihilar edema appears similar to prior exam.  Arlyce Harman, DO 04/30/2017, 9:25 AM PGY-1, [facility] FPTS Intern pager: [phone], text pages welcome

## 2017-05-01 ENCOUNTER — Telehealth: Payer: Self-pay | Admitting: Emergency Medicine

## 2017-05-01 DIAGNOSIS — E662 Morbid (severe) obesity with alveolar hypoventilation: Secondary | ICD-10-CM

## 2017-05-01 DIAGNOSIS — G4733 Obstructive sleep apnea (adult) (pediatric): Secondary | ICD-10-CM

## 2017-05-01 NOTE — Telephone Encounter (Signed)
Spoke with Melissa. She is aware that the pt has been scheudled to see RB and the BiPAP order has been placed. Per Melissa, pt will need to have an ABG done as well. Order has been placed.

## 2017-05-01 NOTE — Telephone Encounter (Signed)
[place], [place] [phone].  Patient discharged from hosp yesterday on loaner bipap and patient needs to make an appt with [name] and be qualified for bipap.  Will need an order to qualify. Will need to qualify from the beginning.

## 2017-05-01 NOTE — Addendum Note (Signed)
Addended by: Jaynee Eagles C on: 05/01/2017 11:57 AM   Modules accepted: Orders

## 2017-05-01 NOTE — Telephone Encounter (Signed)
Chilton Greathouse, MD  P Lbpu Triage Pool Cc: Leslye Peer, MD        We saw [facility] as consult for acute on chronic hypercarbic resp failure.  He had to cancel appointment with RB this week while he was here.   Please order a Bipap titration for OSA/OHS and arrange follow up with RB or NP. Thanks   RB- Sending this as FYI.   Praveen Mannam   ------------------------------------------ Spoke with pt's mother. He is has been scheduled to see RB on 05/12/17 at 10am. Order has been placed for BiPAP titration study. Nothing further was needed.

## 2017-05-02 NOTE — Addendum Note (Signed)
Addended by: Jaynee Eagles C on: 05/02/2017 10:22 AM   Modules accepted: Orders

## 2017-05-05 ENCOUNTER — Ambulatory Visit (INDEPENDENT_AMBULATORY_CARE_PROVIDER_SITE_OTHER): Payer: Medicaid Other | Admitting: Internal Medicine

## 2017-05-05 VITALS — Temp 98.1°F | Ht <= 58 in | Wt 365.2 lb

## 2017-05-05 DIAGNOSIS — K8020 Calculus of gallbladder without cholecystitis without obstruction: Secondary | ICD-10-CM

## 2017-05-05 DIAGNOSIS — J9611 Chronic respiratory failure with hypoxia: Secondary | ICD-10-CM | POA: Diagnosis not present

## 2017-05-05 DIAGNOSIS — R251 Tremor, unspecified: Secondary | ICD-10-CM

## 2017-05-05 NOTE — Progress Notes (Signed)
[facility] Phone: [phone]  Subjective:  [name] is a 21 year old male presenting to clinic for hospital follow-up. He was hospitalized from 04/28/17-04/30/17 with acute on chronic hypoxic respiratory failure and RUQ abdominal pain. Work-up showed gallstones without evidence of cholecystitis. For his respiratory failure, his CXR showed vascular congestion and he was thought to be fluid overloaded.   Chronic Respiratory Failure: 2/2 HFpEF, OSA, OHS. Doing much better since leaving the hospital. Does not feel short of breath or "out of it" like he did before he was hospitalized. Taking Torsemide and urinating a lot with this. Family has gotten a scale but have not started weighing him daily yet. Has been stable on 2L O2. Has a follow-up appointment scheduled with his pulmonologist in 1 week.  Cholelithiasis: Has not had any episodes of RUQ pain since leaving the hospital. No fevers, no nausea, no vomiting. Eating like normal.  Tremor: Started 4 weeks ago after he fell and hit his head. Was seen in the ED and had a tongue laceration that was repaired. Also had a CT head which was negative. Tremor is constant. Tremor is overall getting better. Nothing seems to make it worse. Tremor is located primarily in the hands and head. No new medications except was prescribed some Hydrocodone after his fall 4 weeks ago. He took just one dose of this. 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 Per godmother, the tremor improved after calcium repletion, but is still present. Family would like a referral to Neurology.  ROS: See HPI for pertinent positives and negatives  Past Medical History- HFpEF, HTN, asthma, OHS, T2DM, Prader-Willi syndrome, morbid obesity  Family history reviewed for today's visit. No changes.  Social history- patient is a never smoker  Objective: Temp 98.1 F (36.7 C) (Oral)   Ht  (1.372  m)   Wt (!) 365 lb 3.2 oz (165.7 kg)   BMI 88.05 kg/m  Gen: NAD, alert, cooperative with exam HEENT: NCAT, EOMI, MMM Neck: FROM, supple CV: RRR, no murmur Resp: [place] in place, decreased air movement throughout all lung fields but lungs clear, normal work of breathing GI: Soft, non-tender, non-distended, no RUQ pain. Msk: 1+ pitting edema to the knees bilaterally Neuro: Alert and oriented, resting tremor in the hands bilaterally, tremor improves with intentional movement. Skin: No rashes, no lesions  Assessment/Plan: Chronic Respiratory Failure: Multifactorial due to HFpEF, OHS, OSA, asthma. Improved since leaving the hospital, on home 2L O2. - Continue Torsemide  daily - Continue Albuterol, Flovent, Singulair - Advised family to weigh patient daily and let us know if weight increases by >3lbs. - Discussed low salt diet - Appt scheduled with pulmonologist on 10/8  Cholelithiasis: No abdominal pain since leaving the hospital. Abdominal exam benign. - Monitor symptoms closely - Family will let us know if he has recurrent abdominal pain - Hold off on referral to general surgery at this time. Will refer if he has recurrent symptomatic cholelithiasis or concern for cholecystitis  Tremor: Initially thought to be tetany during his hospitalization due to positive Trousseau sign and low ionized calcium. Has been present for 4 weeks and started after a fall. CT head at the time was negative. Per inpatient notes, patient has seen Dr. Sharene Skeans and has an appointment scheduled with him 10/14; however, I do not see any notes by Dr. Sharene Skeans or any past or future appointments with Dr. Sharene Skeans. Tremor could be an essential  tremor vs drug-induced tremor due to bronchodilator. - Will place referral to Neurology   Willadean Carol, MD PGY-3

## 2017-05-05 NOTE — Patient Instructions (Signed)
It was wonderful to see you today!  I have placed a referral to Neurology. You should hear from our office in the next 2 weeks to schedule this appointment.  Please make sure you are weighing [name] daily. If you notice that he has gained 3lbs from his baseline weight, please let us know!  -Dr. Nancy Marus

## 2017-05-06 ENCOUNTER — Inpatient Hospital Stay: Payer: Medicaid Other | Admitting: Internal Medicine

## 2017-05-07 ENCOUNTER — Ambulatory Visit (HOSPITAL_COMMUNITY)
Admission: RE | Admit: 2017-05-07 | Discharge: 2017-05-07 | Disposition: A | Discharge status: Home / Self Care | Payer: Medicaid Other | Source: Ambulatory Visit | Attending: Emergency Medicine | Admitting: Emergency Medicine

## 2017-05-07 DIAGNOSIS — K8020 Calculus of gallbladder without cholecystitis without obstruction: Secondary | ICD-10-CM | POA: Insufficient documentation

## 2017-05-07 DIAGNOSIS — E662 Morbid (severe) obesity with alveolar hypoventilation: Secondary | ICD-10-CM | POA: Insufficient documentation

## 2017-05-07 DIAGNOSIS — G4733 Obstructive sleep apnea (adult) (pediatric): Secondary | ICD-10-CM

## 2017-05-07 LAB — BLOOD GAS, ARTERIAL
ACID-BASE EXCESS: 19.5 mmol/L — AB (ref 0.0–2.0)
BICARBONATE: 48.5 mmol/L — AB (ref 20.0–28.0)
DRAWN BY: 270211
O2 CONTENT: 1 L/min
O2 Saturation: 75.4 %
PH ART: 7.387 (ref 7.350–7.450)
Patient temperature: 98.6
pCO2 arterial: 82.4 mmHg (ref 32.0–48.0)
pO2, Arterial: 46.5 mmHg — ABNORMAL LOW (ref 83.0–108.0)

## 2017-05-07 NOTE — Assessment & Plan Note (Addendum)
Multifactorial due to HFpEF, OHS, OSA, asthma. Improved since leaving the hospital, on home 2L O2. - Continue Torsemide  daily  - Continue Albuterol, Flovent, Singulair - Advised family to weigh patient daily and let us know if weight increases by >3lbs. - Discussed low salt diet - Appt scheduled with pulmonologist on 10/8

## 2017-05-07 NOTE — Progress Notes (Signed)
Outpatient ABG was collected. ABG was drawn twice and results compared. Once left brachial and once right radial. O2 sat monitor is reading in the 70s and po2 is in the 70s. Dr Delton Coombes was contacted with results. He instructed me to turn his O2 to 3l and have him continuing wearing a night time bipap. He would have his office contact him for appointment set up.

## 2017-05-07 NOTE — Assessment & Plan Note (Signed)
No abdominal pain since leaving the hospital. Abdominal exam benign. - Monitor symptoms closely - Family will let us know if he has recurrent abdominal pain - Hold off on referral to general surgery at this time. Will refer if he has recurrent symptomatic cholelithiasis or concern for cholecystitis

## 2017-05-07 NOTE — Assessment & Plan Note (Addendum)
Initially thought to be tetany during his hospitalization due to positive Trousseau sign and low ionized calcium. Has been present for 4 weeks and started after a fall. CT head at the time was negative. Per inpatient notes, patient has seen Dr. Sharene Skeans and has an appointment scheduled with him 10/14; however, I do not see any notes by Dr. Sharene Skeans or any past or future appointments with Dr. Sharene Skeans. Tremor could be an essential tremor vs drug-induced tremor due to bronchodilator. - Will place referral to Neurology

## 2017-05-12 ENCOUNTER — Encounter: Payer: Self-pay | Admitting: Emergency Medicine

## 2017-05-12 ENCOUNTER — Ambulatory Visit (INDEPENDENT_AMBULATORY_CARE_PROVIDER_SITE_OTHER): Payer: Medicaid Other | Admitting: Emergency Medicine

## 2017-05-12 ENCOUNTER — Other Ambulatory Visit (INDEPENDENT_AMBULATORY_CARE_PROVIDER_SITE_OTHER): Payer: Medicaid Other

## 2017-05-12 DIAGNOSIS — E662 Morbid (severe) obesity with alveolar hypoventilation: Secondary | ICD-10-CM

## 2017-05-12 DIAGNOSIS — J45909 Unspecified asthma, uncomplicated: Secondary | ICD-10-CM

## 2017-05-12 DIAGNOSIS — Z23 Encounter for immunization: Secondary | ICD-10-CM

## 2017-05-12 LAB — BASIC METABOLIC PANEL
BUN: 63 mg/dL — AB (ref 6–23)
CHLORIDE: 81 meq/L — AB (ref 96–112)
CO2: 52 meq/L — AB (ref 19–32)
CREATININE: 1.14 mg/dL (ref 0.40–1.50)
Calcium: 9.7 mg/dL (ref 8.4–10.5)
GFR: 102.09 mL/min (ref >60.00)
GLUCOSE: 139 mg/dL — AB (ref 70–99)
Potassium: 2.9 mEq/L — ABNORMAL LOW (ref 3.5–5.1)
Sodium: 141 mEq/L (ref 135–145)

## 2017-05-12 NOTE — Progress Notes (Addendum)
Subjective:    Patient ID: [name], male    DOB: 12-07-1995, 21 y.o.   MRN: [id number]   Asthma  He complains of shortness of breath and wheezing. There is no cough. Associated symptoms include postnasal drip. Pertinent negatives include no ear pain, fever, headaches, rhinorrhea, sneezing, sore throat or trouble swallowing. His past medical history is significant for asthma.   21 year old never smoker with a history of Prader-Willi syndrome complicated by severe obesity and OSA/OHS. History of atrophic kidney, allergic rhinitis, hypertension. He was also diagnosed as a child with asthma. I have followed him on BiPAP 20/15+4 L/m oxygen. He was just in the hospital with acute on chronic hypercapnic respiratory failure in setting , discharge 9/26 on BiPAP 20/8 + O2 4L/min. He was diuresed and sent home on torsemide  qd.  Some increased cough and clear mucous last few days.  Note ABG below post discharge with significant compensated hypercapnia                      Review of Systems  Constitutional: Negative for fever and unexpected weight change.  HENT: Positive for congestion, postnasal drip and sinus pressure. Negative for dental problem, ear pain, nosebleeds, rhinorrhea, sneezing, sore throat and trouble swallowing.   Eyes: Negative for redness and itching.  Respiratory: Positive for shortness of breath and wheezing. Negative for cough and chest tightness.   Cardiovascular: Negative for palpitations and leg swelling.  Gastrointestinal: Negative for nausea and vomiting.  Genitourinary: Negative for dysuria.  Musculoskeletal: Negative for joint swelling.  Skin: Negative for rash.  Neurological: Negative for headaches.  Hematological: Does not bruise/bleed easily.  Psychiatric/Behavioral: Negative for dysphoric mood. The patient is not nervous/anxious.        Objective:   Physical Exam Vitals:   05/12/17 1002  BP: 130/80  Pulse: (!) 120  SpO2: 94%  Weight: (!) 352 lb (159.7  kg)   Gen: Pleasant, morbidly obese, in no distress, Interacting and answering questions.   ENT: No lesions,  mouth clear,  oropharynx clear, no active  postnasal drip  Neck: No JVD, soft insp and exp stridor  Cardiovascular: RRR, heart sounds normal, no murmur or gallops, no peripheral edema  Musculoskeletal: No deformities, no cyanosis or clubbing  Neuro: alert, answers simple questions, moves all ext, faces consistent w his hx P-W syndrome.   Skin: Warm, no lesions or rashes     Assessment & Plan:  Asthma Suspect that most of this is upper airway in nature. Continue his albuterol nebulizer as needed. They are working on secretion clearance, cough but can ask  Obesity hypoventilation syndrome (HCC) Previously on BiPAP 20/15 and discharged recently from the hospital on BiPAP 20/8 with a backup rate of 10+ oxygen. Unfortunately follow-up ABG on these settings from 05/07/17 show progressive hypercapnia. PCO2 greater than 80 (82). A contributor is relative alkalosis in the setting of his recent diuresis. 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 Because he has failed CPAP and BiPAP I believe he merits home ventilation with inspiratory pressure 20, expiratory pressure 15, pressure support 5, backup rate 10.   I broached subject with his family today that ultimately should he continue to gain weight he may require tracheostomy etc. I do not believe he is at this point today. We will request home ventilator through [facility].   We will work to change to a home ventilator machine with updated settings Continue to wear your ventilation mask + oxygen at night Wear your oxygen at 2L/min at all times while awake Continue to keep albuterol as needed for shortness of breath.  Decrease torsemide to  (2 tablets) once a day  until next visit. If breathing worsens or weight increases after we make this change then call our office to let us know.  Lab work today.  Follow with Dr Delton Coombes in 2 months or sooner if you have any problems.  Levy Pupa, MD, PhD 05/15/2017, 4:52 PM [facility] [phone] or if no answer [phone]

## 2017-05-12 NOTE — Assessment & Plan Note (Addendum)
Previously on BiPAP 20/15 and discharged recently from the hospital on BiPAP 20/8 with a backup rate of 10+ oxygen. Unfortunately follow-up ABG on these settings from 05/07/17 show progressive hypercapnia. PCO2 greater than 80 (82). A contributor is relative alkalosis in the setting of his recent diuresis. 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 Because he has failed CPAP and BiPAP I believe he merits home ventilation with inspiratory pressure 20, expiratory pressure 15, pressure support 5, backup rate 10.   I broached subject with his family today that ultimately should he continue to gain weight he may require tracheostomy etc. I do not believe he is at this point today. We will request home ventilator through [facility].   We will work to change to a home ventilator machine with updated settings Continue to wear your ventilation mask + oxygen at night Wear your oxygen at 2L/min at all times while awake Continue to keep albuterol as needed for shortness of breath.  Decrease torsemide to  (2 tablets) once a day until next visit. If breathing worsens or weight increases after we make this change then call our office to let us know.  Lab work today.  Follow with Dr Delton Coombes in 2 months or sooner if you have any problems.

## 2017-05-12 NOTE — Assessment & Plan Note (Signed)
Suspect that most of this is upper airway in nature. Continue his albuterol nebulizer as needed. They are working on secretion clearance, cough but can ask

## 2017-05-12 NOTE — Addendum Note (Signed)
Addended by: Wyvonne Lenz on: 05/12/2017 10:46 AM   Modules accepted: Orders

## 2017-05-12 NOTE — Addendum Note (Signed)
Addended by: Jaynee Eagles C on: 05/12/2017 11:02 AM   Modules accepted: Orders

## 2017-05-12 NOTE — Patient Instructions (Addendum)
We will investigate which biPAP machine you have at home. We will work to change to a home ventilator machine with updated settings Continue to wear your ventilation mask + oxygen at night Wear your oxygen at 2L/min at all times while awake Continue to keep albuterol as needed for shortness of breath.  Decrease torsemide to  (2 tablets) once a day until next visit. If breathing worsens or weight increases after we make this change then call our office to let us know.  Lab work today.  Follow with Dr Delton Coombes in 2 months or sooner if you have any problems.

## 2017-05-14 ENCOUNTER — Telehealth: Payer: Self-pay | Admitting: Emergency Medicine

## 2017-05-14 DIAGNOSIS — E662 Morbid (severe) obesity with alveolar hypoventilation: Secondary | ICD-10-CM

## 2017-05-14 MED ORDER — AZITHROMYCIN 250 MG PO TABS: ORAL_TABLET | ORAL | 0 refills | Status: DC

## 2017-05-14 NOTE — Telephone Encounter (Signed)
Spoke with patient's mother. She is aware of recs. Will go ahead and call in medication to [facility] on [address]. Nothing else needed at time of call.

## 2017-05-14 NOTE — Telephone Encounter (Signed)
Spoke with pt, his mother states his mucus was clear when he was here in the office on Monday. The cough is worse and his mucus has turned a greenish brown color and was told to let RB know if this happened. MW please advise. RB not here thanks

## 2017-05-14 NOTE — Telephone Encounter (Signed)
Called Melissa and advised to call back to discuss.

## 2017-05-14 NOTE — Telephone Encounter (Signed)
[id number] 

## 2017-05-14 NOTE — Telephone Encounter (Signed)
Spoke with pt, she would like RB to addend the note for [place] [employer] because they require a statement stating the need of benefit of NIV, something to the effect of bipap was failed and proof of the need and benefit of NIV. She also states this patient's diagnosis is not COPD because if so they could use ABG lab results but he has a diagnosis obesity hypoventilation disorder and therefore he needs FVC results from spirometry, which she could not find in his chart. We would have to order a spirometry and have RB addend note. RB please advise.

## 2017-05-15 NOTE — Telephone Encounter (Signed)
Thank you. I addendd his office note. Just need to send them the [facility] when available

## 2017-05-15 NOTE — Telephone Encounter (Signed)
We can order a FVC (spirometry) for him > I am sure that it will show restriction.  Please call them to confirm that that the only other things needed are documentation that biPAp is inadequate. I want to know if they need repeat blood gasses, etc. Once we have determined this I will addend his office note to include the details they need.

## 2017-05-15 NOTE — Telephone Encounter (Signed)
Patient has been scheduled for a spirometry on 10/19 at 10am. Spoke with patient's mother. She is aware of appt date and time.

## 2017-05-15 NOTE — Telephone Encounter (Signed)
Spoke with Melissa at [facility]. She stated that the addendum needs to state the following: statement of how the Bipap with backup rate was tried and failed and explain how the use of NIV would help and what will happen if the patient does not get a NIV.   She also stated that the spirometry would satisfy the requirement of having a FVC on the patient.

## 2017-05-19 NOTE — Telephone Encounter (Signed)
Will hold message until after 10/19 spirometry so this can all be sent to [facility].

## 2017-05-23 ENCOUNTER — Ambulatory Visit (INDEPENDENT_AMBULATORY_CARE_PROVIDER_SITE_OTHER): Payer: Medicaid Other | Admitting: Emergency Medicine

## 2017-05-23 DIAGNOSIS — E662 Morbid (severe) obesity with alveolar hypoventilation: Secondary | ICD-10-CM

## 2017-05-23 NOTE — Telephone Encounter (Signed)
PFT/spirometry was attempted today at pt's appt. Multiple attempts were made by myself and Jerolyn Shinammy Wilson without success. Pt had difficulty understanding and following directions. Pt was unable to exhale for 6 seconds, pt would forcefully exhale but would resume normal breathing after exhale. The attempts were saved in breeze.   Dr. Delton CoombesByrum please advise. Thanks.

## 2017-05-23 NOTE — Progress Notes (Signed)
Unable to collect spirometry. Multiple attempts made by two PFT techs. Attempts have been saved in breeze. No charge visit.

## 2017-05-26 NOTE — Telephone Encounter (Signed)
I reviewed the Spirometry. He had a best FVC of 23% predicted. We should use this as his qualifying FVC.

## 2017-05-27 NOTE — Telephone Encounter (Signed)
lmtcb x1 for Melissa with [facility]. 

## 2017-05-27 NOTE — Telephone Encounter (Signed)
Advised Melissa of RB message. Nothing further is needed.

## 2017-05-27 NOTE — Telephone Encounter (Signed)
Melissa returning call and can be reached @ [phone].Stanley A Dalton ° ° °

## 2017-06-04 ENCOUNTER — Other Ambulatory Visit: Payer: Self-pay | Admitting: *Deleted

## 2017-06-04 MED ORDER — TORSEMIDE 20 MG PO TABS: 60.0000 mg | ORAL_TABLET | Freq: Every day | ORAL | 3 refills | Status: DC

## 2017-06-30 ENCOUNTER — Encounter: Payer: Self-pay | Admitting: Diagnostic Neuroimaging

## 2017-06-30 ENCOUNTER — Ambulatory Visit: Payer: Medicaid Other | Admitting: Diagnostic Neuroimaging

## 2017-06-30 VITALS — BP 132/82 | HR 111 | Ht <= 58 in | Wt 358.8 lb

## 2017-06-30 DIAGNOSIS — G252 Other specified forms of tremor: Secondary | ICD-10-CM

## 2017-06-30 NOTE — Progress Notes (Signed)
[facility]  PATIENT: [name] DOB: [birth year]  REFERRING CLINICIAN: Arty Baumgartner, MD HISTORY FROM: patient  REASON FOR VISIT: new consult    HISTORICAL  CHIEF COMPLAINT:  Chief Complaint  Patient presents with  . NP  [facility]  . Tremors    Hand tremors since last ED 04-28-17.  Worse in am, better in PM.  Mother and care giver with pt. Has Prader Willi syndrome    HISTORY OF PRESENT ILLNESS:   21 year old male with history of Prader-Willi syndrome, hypertension, diabetes, here for evaluation of tremors.  In September 2018 patient was admitted for lethargy and not acting normal, and patient was diagnosed with acute on chronic respiratory failure.  Patient was treated with BiPAP and diuresis.  Patient was also noted to have intermittent tremors in upper extremities.  During hospitalization, breathing was stabilized, volume status was optimized, and tremors reduced.  Since that time tremors have improved.  He continues to have some mild intermittent tremors and left greater than right upper extremity.  No change in gait or balance.  No other significant complaints.   REVIEW OF SYSTEMS: Full 14 system review of systems performed and negative with exception of: Shortness of breath allergies.  ALLERGIES: No Known Allergies  HOME MEDICATIONS: Outpatient Medications Prior to Visit  Medication Sig Dispense Refill  . ACCU-CHEK AVIVA PLUS test strip as directed 100 each 12  . ACCU-CHEK SOFTCLIX LANCETS lancets Use as instructed 100 each 12  . albuterol (PROVENTIL) (2.5 MG/3ML) 0.083% nebulizer solution inhale contents of 1 vial in nebulizer once daily 75 mL 12  . calcium carbonate (TUMS - DOSED IN MG ELEMENTAL CALCIUM) 500 MG chewable tablet Chew 2 tablets (400 mg of elemental calcium total) by mouth 3 (three) times daily between meals as needed for indigestion or heartburn. 60 tablet 3  . cetirizine (ZYRTEC) 10 MG tablet Take 1 tablet (10 mg total) by mouth daily. 90  tablet 1  . desmopressin (DDAVP) 0.2 MG tablet Take 0.4 mg by mouth at bedtime.     . fluticasone (FLONASE) 50 MCG/ACT nasal spray instill 2 sprays into each nostril once daily 16 g 6  . fluticasone (FLOVENT HFA) 110 MCG/ACT inhaler Inhale 2 puffs into the lungs 2 (two) times daily. 1 Inhaler 12  . hydrochlorothiazide (HYDRODIURIL) 25 MG tablet Take 1 tablet (25 mg total) by mouth daily. 90 tablet 3  . ibuprofen (ADVIL,MOTRIN) 200 MG tablet Take 600-800 mg by mouth every 6 (six) hours as needed for mild pain.    . Lancets 30G MISC 1 Device by Does not apply route daily before breakfast. 100 each 3  . metFORMIN (GLUCOPHAGE-XR) 500 MG 24 hr tablet take 1 tablet by mouth once daily WITH BREAKFAST 60 tablet 0  . Misc. Devices MISC Please provide a pulse oximeter for the ear lobe. Patient has diagnosis of Raynaud, asthma 1 each 0  . montelukast (SINGULAIR) 10 MG tablet Take 1 tablet (10 mg total) by mouth at bedtime. 90 tablet 3  . olopatadine (PATANOL) 0.1 % ophthalmic solution Place 1 drop into both eyes 2 (two) times daily as needed for allergies.  0  . oxybutynin (DITROPAN) 5 MG tablet Take 2 tablets (10 mg total) by mouth at bedtime. 60 tablet 0  . pantoprazole (PROTONIX) 20 MG tablet Take 1 tablet (20 mg total) by mouth daily. 90 tablet 2  . PREVIDENT 5000 ENAMEL PROTECT 1.1-5 % PSTE Take 1 application by mouth 2 (two) times daily.   0  .  PROAIR HFA 108 (90 Base) MCG/ACT inhaler inhale 2 puff by mouth every 6 hours if needed 8.5 Inhaler 0  . Respiratory Therapy Supplies (NEBULIZER/ADULT MASK) KIT 1 Device by Does not apply route once. 1 each 0  . Respiratory Therapy Supplies MISC BiPAP IPAP:20, EPAP: 8, back up respiratory rate: 10, FIO2: 35%. 1 each 0  . Spacer/Aero-Holding Chambers (AEROCHAMBER MV) inhaler Use with albuterol inhaler 1 each 1  . torsemide (DEMADEX) 20 MG tablet Take 3 tablets (60 mg total) by mouth daily. 90 tablet 3  . azithromycin (ZITHROMAX) 250 MG tablet Take 2 tablets on  first day. Then 1 tablet daily until finished. (Patient not taking: Reported on 06/30/2017) 6 tablet 0   No facility-administered medications prior to visit.     PAST MEDICAL HISTORY: Past Medical History:  Diagnosis Date  . Asthma   . Diabetes mellitus without complication ([place])   . Hypertension    Pulmomary htn  . Kidney dysfunction    left  . Obesity   . Obesity hypoventilation syndrome ([place])   . Prader-Willi syndrome   . Raynaud's phenomenon   . Sleep apnea     PAST SURGICAL HISTORY: Past Surgical History:  Procedure Laterality Date  . NISSEN FUNDOPLICATION    . TONSILLECTOMY      FAMILY HISTORY: No family history on file.  SOCIAL HISTORY:  Social History   Socioeconomic History  . Marital status: Single    Spouse name: Not on file  . Number of children: Not on file  . Years of education: Not on file  . Highest education level: Not on file  Social Needs  . Financial resource strain: Not on file  . Food insecurity - worry: Not on file  . Food insecurity - inability: Not on file  . Transportation needs - medical: Not on file  . Transportation needs - non-medical: Not on file  Occupational History  . Not on file  Tobacco Use  . Smoking status: Never Smoker  . Smokeless tobacco: Never Used  Substance and Sexual Activity  . Alcohol use: No  . Drug use: No  . Sexual activity: No    Birth control/protection: Abstinence  Other Topics Concern  . Not on file  Social History Narrative   Lives with mom, very supportive, Doroteo Bradford and brother.  HS education.  Has Prader Willi syndrome.      PHYSICAL EXAM  GENERAL EXAM/CONSTITUTIONAL: Vitals:  Vitals:   06/30/17 0952  BP: 132/82  Pulse: (!) 111  SpO2: 91%  Weight: (!) 358 lb 12.8 oz (162.8 kg)  Height: '4\' 6"'  (1.372 m)     Body mass index is 86.51 kg/m.  No exam data present  Patient is in no distress; well developed, nourished and groomed; neck is supple  CALM, PLEASANT; HAS O2 VIA  [place]  CARDIOVASCULAR:  Examination of carotid arteries is normal; no carotid bruits  Regular rate and rhythm, no murmurs  Examination of peripheral vascular system by observation and palpation is normal  EYES:  Ophthalmoscopic exam of optic discs and posterior segments is normal; no papilledema or hemorrhages  MUSCULOSKELETAL:  Gait, strength, tone, movements noted in Neurologic exam below  NEUROLOGIC: MENTAL STATUS:  No flowsheet data found.  awake, alert, oriented to person  DECR recent and remote memory intact  DECR attention and concentration  DECR FLUENCY, comprehension intact, naming intact  fund of knowledge DECR  CRANIAL NERVE:   2nd - no papilledema on fundoscopic exam  2nd, 3rd, 4th, 6th -  pupils equal and reactive to light, visual fields full to confrontation, extraocular muscles intact, no nystagmus  5th - facial sensation symmetric  7th - facial strength symmetric  8th - hearing intact  9th - palate elevates symmetrically, uvula midline  11th - shoulder shrug symmetric  12th - tongue protrusion midline  MOTOR:   normal bulk and tone, full strength in the BUE, BLE  MINIMAL POSTURAL TREMOR IN LUE > RUE  SENSORY:   normal and symmetric to light touch, temperature, vibration  COORDINATION:   finger-nose-finger, fine finger movements SLOW   REFLEXES:   deep tendon reflexes TRACE and symmetric  GAIT/STATION:   narrow based gait    DIAGNOSTIC DATA (LABS, IMAGING, TESTING) - I reviewed patient records, labs, notes, testing and imaging myself where available.  Lab Results  Component Value Date   WBC 6.7 04/30/2017   HGB 10.4 (L) 04/30/2017   HCT 37.0 (L) 04/30/2017   MCV 76.1 (L) 04/30/2017   PLT 147 (L) 04/30/2017      Component Value Date/Time   NA 141 05/12/2017 1102   NA 135 03/05/2017 1607   K 2.9 (L) 05/12/2017 1102   CL 81 (L) 05/12/2017 1102   CO2 52 (H) 05/12/2017 1102   GLUCOSE 139 (H) 05/12/2017 1102   BUN 63  (H) 05/12/2017 1102   BUN 38 (H) 03/05/2017 1607   CREATININE 1.14 05/12/2017 1102   CREATININE 1.00 03/06/2016 1242   CALCIUM 9.7 05/12/2017 1102   PROT 7.5 04/28/2017 0321   ALBUMIN 3.0 (L) 04/28/2017 0321   AST 23 04/28/2017 0321   ALT 19 04/28/2017 0321   ALKPHOS 92 04/28/2017 0321   BILITOT 0.8 04/28/2017 0321   GFRNONAA >60 04/30/2017 0408   GFRNONAA >89 03/08/2015 1559   GFRAA >60 04/30/2017 0408   GFRAA >89 03/08/2015 1559   Lab Results  Component Value Date   CHOL 164 05/21/2010   HDL 42 05/21/2010   LDLCALC 100 05/21/2010   LDLDIRECT 70 01/21/2008   TRIG 112 05/21/2010   CHOLHDL 3.9 Ratio 05/21/2010   Lab Results  Component Value Date   HGBA1C 6.6 (H) 02/27/2017   No results found for: [id number] Lab Results  Component Value Date   TSH 6.959 (H) 02/27/2017    04/16/17 CT head [I reviewed images myself and agree with interpretation. -VRP]  1. No facial fracture or acute traumatic injury identified. 2. Negative noncontrast CT appearance of the brain.     ASSESSMENT AND PLAN  21 y.o. year old male here with new onset postural tremor in setting of hypercapnia, acute on chronic respiratory failure, O2 dependence, obesity hypoventilation syndrome.  Overall symptoms have somewhat improved with optimization of respiratory status.  CT head and neurologic examination are nonfocal. Suspect tremors are secondary to metabolic, medical or hormonal issues.   Ddx postural tremor: enhanced physiologic tremor, hypoxemia, hypoglycemia, hypotension, metabolic, medication (albuterol)  1. Postural tremor      PLAN:  - follow up elevated TSH with PCP - monitor O2, BP, glucose at home; record in a log to see if there is correlation with tremor  Return if symptoms worsen or fail to improve, for return to PCP.    Penni Bombard, MD 69/79/4801, 65:53 AM Certified in Neurology, Neurophysiology and Neuroimaging  [facility] [address] [place], [place] [address] [phone]

## 2017-06-30 NOTE — Patient Instructions (Signed)
-   follow up TSH with PCP  - monitor O2, BP, glucose at home; record in log and see if there is correlation with tremor

## 2017-07-04 ENCOUNTER — Ambulatory Visit: Payer: Medicaid Other | Admitting: Emergency Medicine

## 2017-07-04 ENCOUNTER — Encounter: Payer: Self-pay | Admitting: Emergency Medicine

## 2017-07-04 DIAGNOSIS — J45909 Unspecified asthma, uncomplicated: Secondary | ICD-10-CM

## 2017-07-04 DIAGNOSIS — E662 Morbid (severe) obesity with alveolar hypoventilation: Secondary | ICD-10-CM | POA: Diagnosis not present

## 2017-07-04 NOTE — Assessment & Plan Note (Signed)
Continue current plan of albuterol as needed.  I do not believe he needs an inhaled corticosteroid or scheduled bronchodilators at this time.

## 2017-07-04 NOTE — Progress Notes (Signed)
Subjective:    Patient ID: [name], male    DOB: [birth year], 21 y.o.   MRN: [id number]   Asthma  He complains of shortness of breath and wheezing. There is no cough. Associated symptoms include postnasal drip. Pertinent negatives include no ear pain, fever, headaches, rhinorrhea, sneezing, sore throat or trouble swallowing. His past medical history is significant for asthma.   [age] year old never smoker with a history of Prader-Willi syndrome complicated by severe obesity and OSA/OHS. History of atrophic kidney, allergic rhinitis, hypertension. He was also diagnosed as a child with asthma. I have followed him on BiPAP 20/15+4 L/m oxygen. He was just in the hospital with acute on chronic hypercapnic respiratory failure in setting , discharge 9/26 on BiPAP 20/8 + O2 4L/min. He was diuresed and sent home on torsemide 60mg  qd.  Some increased cough and clear mucous last few days.  Note ABG below post discharge with significant compensated hypercapnia  ROV 07/04/17 --pleasant 21 year old young man with Prader-Willi syndrome, obesity, OSA/OHS.  He has been managed in the past with BiPAP to support his chronic ventilatory failure.  We have made arrangements to change him to a trilogy ventilator.  Received it about 10 days ago. Has tolerated well. More active during the day. Tremors also on torsemide which we adjusted last visit.  Serum creatinine stable on evaluation 10/8. Takes {Pro Air every morning. Wants to get back to the pool, needs to be weaned off of daytime oxygen in order to accomplish this.                      Review of Systems  Constitutional: Negative for fever and unexpected weight change.  HENT: Positive for congestion, postnasal drip and sinus pressure. Negative for dental problem, ear pain, nosebleeds, rhinorrhea, sneezing, sore throat and trouble swallowing.   Eyes: Negative for redness and itching.  Respiratory: Positive for shortness of breath and wheezing. Negative for cough and  chest tightness.   Cardiovascular: Negative for palpitations and leg swelling.  Gastrointestinal: Negative for nausea and vomiting.  Genitourinary: Negative for dysuria.  Musculoskeletal: Negative for joint swelling.  Skin: Negative for rash.  Neurological: Negative for headaches.  Hematological: Does not bruise/bleed easily.  Psychiatric/Behavioral: Negative for dysphoric mood. The patient is not nervous/anxious.        Objective:   Physical Exam Vitals:   07/04/17 1041  BP: 134/88  Pulse: (!) 115  SpO2: 92%  Weight: (!) 357 lb (161.9 kg)   Gen: Pleasant, morbidly obese, in no distress, Interacting and answering questions.   ENT: No lesions,  mouth clear,  oropharynx clear, no active  postnasal drip  Neck: No JVD, soft insp and exp stridor  Cardiovascular: RRR, heart sounds normal, no murmur or gallops, no peripheral edema  Musculoskeletal: No deformities, no cyanosis or clubbing  Neuro: alert, answers simple questions, moves all ext, faces consistent w his hx P-W syndrome.   Skin: Warm, no lesions or rashes     Assessment & Plan:  Obesity hypoventilation syndrome (HCC) He has the new trilogy ventilator and appears to be tolerating very well.  He also is likely benefiting based on his daytime symptoms.  He is anxious to get back to doing his exercises in the pool.  In order to accomplish this we need to establish whether he desaturates during the day.  Now that he is improved from his hospitalization he may be able to go back to room air and we will check for this  today.  Asthma Continue current plan of albuterol as needed.  I do not believe he needs an inhaled corticosteroid or scheduled bronchodilators at this time.  Levy Pupaobert Thaddius Manes, MD, PhD 07/04/2017, 11:08 AM [facility] [phone] or if no answer [phone]

## 2017-07-04 NOTE — Patient Instructions (Signed)
We will continue your trilogy ventilator every night as you have been doing it We will check your oxygen level today to see if you still need to wear oxygen while you are awake Continue your current dose of torsemide.  Your lab work at your last visit showed good kidney function Continue to use pro-air 2 puffs as needed for shortness of breath, wheezing, chest tightness Follow with Dr Delton CoombesByrum in 4 months or sooner if you have any problems.

## 2017-07-04 NOTE — Assessment & Plan Note (Signed)
He has the new trilogy ventilator and appears to be tolerating very well.  He also is likely benefiting based on his daytime symptoms.  He is anxious to get back to doing his exercises in the pool.  In order to accomplish this we need to establish whether he desaturates during the day.  Now that he is improved from his hospitalization he may be able to go back to room air and we will check for this today.

## 2017-08-04 ENCOUNTER — Telehealth: Payer: Self-pay | Admitting: Emergency Medicine

## 2017-08-04 NOTE — Telephone Encounter (Signed)
Agree with this plan.

## 2017-08-04 NOTE — Telephone Encounter (Signed)
Called and spoke to pt's mother. Informed her of the recs per CY. She verbalized understanding and states she will call back before the weekend and is aware to take pt to ED if s/s were to worsen. Will keep message open to follow up.  Will forward to RB as FYI.

## 2017-08-04 NOTE — Telephone Encounter (Signed)
Called and spoke with pt's Mother, Octavio GravesBonita. Octavio GravesBonita states pt gained 5lb over and wanted to know if okay to increase Torsemide. Octavio GravesBonita is unsure if pt has feet/hand edema, but feels that he is retaining fluid in his stomach. Pt has red area from right side to middle abdomen. Octavio GravesBonita states pt had experienced the abdomen redness when admitted as well.  RB please advise. Thanks.    No Known Allergies

## 2017-08-04 NOTE — Telephone Encounter (Signed)
Ok to increase torsemide from 3 up to 4 tablets daily x 3 days, to try this until after New Years. Then drop back to 3 tablets daily and contact Dr Delton CoombesByrum for further advice.

## 2017-08-04 NOTE — Telephone Encounter (Signed)
Forwarding to DOD.

## 2017-08-06 ENCOUNTER — Encounter: Payer: Self-pay | Admitting: Student

## 2017-08-06 ENCOUNTER — Other Ambulatory Visit: Payer: Self-pay

## 2017-08-06 ENCOUNTER — Ambulatory Visit: Payer: Medicaid Other | Admitting: Student

## 2017-08-06 VITALS — BP 110/68 | HR 106 | Temp 99.5°F | Ht <= 58 in | Wt 360.2 lb

## 2017-08-06 DIAGNOSIS — L03311 Cellulitis of abdominal wall: Secondary | ICD-10-CM | POA: Diagnosis not present

## 2017-08-06 MED ORDER — CEPHALEXIN 500 MG PO CAPS: 500.0000 mg | ORAL_CAPSULE | Freq: Four times a day (QID) | ORAL | 0 refills | Status: DC

## 2017-08-06 NOTE — Patient Instructions (Signed)
It was great seeing you today! We have addressed the following issues today  Skin rash: This is likely cellulitis, skin infection.  We sent a prescription for antibiotic to his pharmacy.  Please pick up this prescription and start taking today.  Make sure he complete the whole course.  Follow-up in 3 days or sooner if needed.  You can use topical Neosporin  If we did any lab work today, and the results require attention, either me or my nurse will get in touch with you. If everything is normal, you will get a letter in mail and a message via . If you don't hear from us in two weeks, please give us a call. Otherwise, we look forward to seeing you again at your next visit. If you have any questions or concerns before then, please call the clinic at [phone].  Please bring all your medications to every doctors visit  Sign up for My Chart to have easy access to your labs results, and communication with your Primary care physician.    Please check-out at the front desk before leaving the clinic.    Take Care,   Dr. Alanda SlimGonfa   Cellulitis, Adult Cellulitis is a skin infection. The infected area is usually red and sore. This condition occurs most often in the arms and lower legs. It is very important to get treated for this condition. Follow these instructions at home:  Take over-the-counter and prescription medicines only as told by your doctor.  If you were prescribed an antibiotic medicine, take it as told by your doctor. Do not stop taking the antibiotic even if you start to feel better.  Drink enough fluid to keep your pee (urine) clear or pale yellow.  Do not touch or rub the infected area.  Raise (elevate) the infected area above the level of your heart while you are sitting or lying down.  Place warm or cold wet cloths (warm or cold compresses) on the infected area. Do this as told by your doctor.  Keep all follow-up visits as told by your doctor. This is important. These visits  let your doctor make sure your infection is not getting worse. Contact a doctor if:  You have a fever.  Your symptoms do not get better after 1-2 days of treatment.  Your bone or joint under the infected area starts to hurt after the skin has healed.  Your infection comes back. This can happen in the same area or another area.  You have a swollen bump in the infected area.  You have new symptoms.  You feel ill and also have muscle aches and pains. Get help right away if:  Your symptoms get worse.  You feel very sleepy.  You throw up (vomit) or have watery poop (diarrhea) for a long time.  There are red streaks coming from the infected area.  Your red area gets larger.  Your red area turns darker. This information is not intended to replace advice given to you by your health care provider. Make sure you discuss any questions you have with your health care provider. Document Released: 01/08/2008 Document Revised: 12/28/2015 Document Reviewed: 05/31/2015 Elsevier Interactive Patient Education  2018 [employer]

## 2017-08-06 NOTE — Progress Notes (Signed)
  Subjective:    [name] is a [age] y.o. old male here for skin rash.  Patient is here with his father.  HPI Skin rash: on his belly. Started about 5 days ago. Pruritic. He is picking at it. Getting bigger. Not sure if he had a rash like that before. No recent illness. Denies fever, chills, new medicine, food, soap or detergent. No family member with similar rash.  PMH/Problem List: has OBESITY, MORBID; VISUAL IMPAIRMENT; Essential hypertension, benign; RAYNAUD'S SYNDROME; Asthma; ORTHOSTATIC PROTEINURIA; Prader-Willi syndrome; SYMPTOM, ENURESIS, NOCTURNAL; Obesity hypoventilation syndrome (HCC); Allergic rhinitis; Chronic respiratory failure (HCC); Diabetes mellitus type 2 in obese (HCC); Tremor bilateral hands; Diastolic congestive heart failure (HCC); and Calculus of gallbladder without cholecystitis without obstruction on their problem list.   has a past medical history of Asthma, Diabetes mellitus without complication (HCC), Hypertension, Kidney dysfunction, Obesity, Obesity hypoventilation syndrome (HCC), Prader-Willi syndrome, Raynaud's phenomenon, and Sleep apnea.  FH:  No family history on file.  SH Social History   Tobacco Use  . Smoking status: Never Smoker  . Smokeless tobacco: Never Used  Substance Use Topics  . Alcohol use: No  . Drug use: No    Review of Systems Review of systems negative except for pertinent positives and negatives in history of present illness above.     Objective:     Vitals:   08/06/17 1010 08/06/17 1052  BP: 110/68   Pulse: (!) 106   Temp: 99.5 F (37.5 C)   TempSrc: Oral   SpO2: (!) 86% 92%  Weight: (!) 360 lb 3.2 oz (163.4 kg)   Height: 4' 9.5" (1.461 m)    Body mass index is 76.6 kg/m.  Physical Exam  GEN: appears obese, no apparent distress. HEM: negative for cervical or periauricular lymphadenopathies CVS: RRR, nl s1 & s2, no murmurs, no edema RESP: On oxygen by , no IWOB, CTAB but limited exam due to body habitus GI: BS  present & normal, soft, NTND SKIN: Erythematous skin lesion over his abdomen bilaterally.  Warm to touch.  No tenderness to palpation.  Noted three spots of superficial ulceration without drainage or underlying fluid loculation.  See pictures for more.    NEURO: alert and oiented appropriately, no gross deficits  Assessment and Plan:  1. Cellulitis of abdominal wall: No drainage or purulence.  No underlying fluid loculation.  No recent antibiotic use.  He has no constitutional symptoms.  Overall, he appears well.  Will treat with Keflex 500 mg 4 times daily for 10 days.  Follow-up in 2 days or sooner if needed. - cephALEXin (KEFLEX) 500 MG capsule; Take 1 capsule (500 mg total) by mouth 4 (four) times daily.  Dispense: 40 capsule; Refill: 0  Return in about 2 days (around 08/08/2017) for F/u on Cellulitis.  Almon Herculesaye T Alexsys Eskin, MD 08/06/17 Pager: [phone]

## 2017-08-08 ENCOUNTER — Ambulatory Visit: Payer: Medicaid Other | Admitting: Family Medicine

## 2017-08-08 ENCOUNTER — Encounter: Payer: Self-pay | Admitting: Family Medicine

## 2017-08-08 ENCOUNTER — Other Ambulatory Visit: Payer: Self-pay

## 2017-08-08 VITALS — BP 110/80 | HR 70 | Temp 98.2°F | Wt 360.0 lb

## 2017-08-08 DIAGNOSIS — L03311 Cellulitis of abdominal wall: Secondary | ICD-10-CM

## 2017-08-08 NOTE — Assessment & Plan Note (Signed)
  Appears to be improving on Keflex. Patient afebrile with stable vital signs, well appearing otherwise. There is significant soft tissue edema that has caused some blistering but no purulent drainage or increased warmth. Area outlined in skin marker. Advised mom to complete course of Keflex. Strict return precautions given for fevers, worsening redness, Cassandra feeling unwell in any way, or purulent drainage. Mom agreed. Follow up if cellulitis fails to improve after completion of course.

## 2017-08-08 NOTE — Progress Notes (Signed)
    Subjective:    Patient ID: [name], male    DOB: [birth year], 22 y.o.   MRN: [id number]   CC: follow up cellulitis  On Keflex day 2, per mom he is doing well, no fevers, eating and drinking normally. He is acting normally. Mom reports the redness looks improved. She is worried that some blisters have formed on the abdomen that he has peeled away. There is no drainage from the wounds. The area is non tender. Mom had called pulmonologist because she was worried about 5 pound weight gain overnight and his dose of torsemide was increased. There is significant swelling in his abdomen.   Review of Systems- see HPI   Objective:  BP 110/80   Pulse 70   Temp 98.2 F (36.8 C) (Oral)   Wt (!) 360 lb (163.3 kg)   SpO2 99% Comment: 2L O2  BMI 76.55 kg/m  Vitals and nursing note reviewed  General: morbidly obese, [place] in place, in NAD Cardiac: RRR, clear S1 and S2, no murmurs, rubs, or gallops Respiratory: clear to auscultation bilaterally, no increased work of breathing Abdomen: soft, nontender, limited by obese abdomen Skin: marked erythema over abdominal wall with pitting edema, appears improved from images on 08/06/17. Slightly warm compared to other skin. Scabbed over sores present with no evidence of drainage. Some areas of blistered skin with clear fluid draining. Neuro: alert and oriented, no focal deficits  Assessment & Plan:    Cellulitis of abdominal wall  Appears to be improving on Keflex. Patient afebrile with stable vital signs, well appearing otherwise. There is significant soft tissue edema that has caused some blistering but no purulent drainage or increased warmth. Area outlined in skin marker. Advised mom to complete course of Keflex. Strict return precautions given for fevers, worsening redness, Benedict feeling unwell in any way, or purulent drainage. Mom agreed. Follow up if cellulitis fails to improve after completion of course.    Return if symptoms worsen or fail  to improve.   Dolores PattyAngela Kyeshia Zinn, DO Family Medicine Resident PGY-2

## 2017-08-08 NOTE — Patient Instructions (Signed)
It was great seeing you today!  Please return to be seen if Dequante develops a fever, the redness extends beyond the line, there is pus coming from the wound, or he is feeling unwell.  If you have questions or concerns please do not hesitate to call at [phone].  Dolores PattyAngela Oralee Rapaport, DO PGY-2, [facility] 08/08/2017 4:17 PM  Erysipelas Erysipelas is an infection that affects the skin and the tissues that are near the surface of the skin. It causes the skin to become red, swollen, and painful. The infection is most common on the legs but may also affect other areas, such as the face. With treatment, the infection usually goes away in a few days. If not treated, the infection can spread or lead to other problems, such as abscesses. What are the causes? Erysipelas is caused by bacteria. Most often, it is caused by bacteria called streptococci. The bacteria often enter through a break in the skin, such as a cut, surgical incision, burn, insect bite, open sore, or crack in the skin. Sometimes the source where the bacteria entered is not known. What increases the risk? Some people are at an increased risk for developing erysipelas, including:  Young children.  Elderly people.  People with a weakened body defense system (immune system), such as people with HIV or AIDS.  People who have diabetes.  People who drink too much alcohol.  People who have had recent surgery.  People with yeast infections of the skin.  People who have swollen legs.  What are the signs or symptoms? The infection causes a reddened area on the skin. This reddened area may:  Be painful and swollen.  Have a distinct border around it.  Feel itchy and hot.  Develop blisters.  Other symptoms may include:  Fever.  Chills.  Nausea and vomiting.  Swollen glands (lymph nodes).  Headache.  Fatigue.  Loss of appetite.  How is this diagnosed? Your health care provider will take your  medical history and do a physical exam. He or she will usually be able to diagnose erysipelas by closely examining your skin. How is this treated? Erysipelas can usually be treated effectively with antibiotic medicines. The infection usually gets better within a few days of treatment. Follow these instructions at home:  Take medicines only as directed by your health care provider.  Take your antibiotic medicine as directed by your health care provider. Finish the antibiotic even if you start to feel better.  If the skin infection is on your leg or arm, elevate the leg or arm to help reduce swelling.  Do not put creams or lotions on the affected area of your skin unless your health care provider instructs you to do that.  Do not share bedding, towels, or washcloths (linens) with other people. Using only your own linens will help to prevent the infection from spreading to others.  Keep all follow-up visits as directed by your health care provider. This is important. Contact a health care provider if:  You have pain or discomfort that is not controlled by medicines.  Your red area of skin gets larger or turns dark in color.  Your skin infection returns in the same area or appears in another area. Get help right away if:  Your fever is getting worse.  Your feelings of illness are getting worse.  You notice red streaks coming from the infected area. This information is not intended to replace advice given to you by your health  care provider. Make sure you discuss any questions you have with your health care provider. Document Released: 04/16/2001 Document Revised: 12/28/2015 Document Reviewed: 03/07/2014 Elsevier Interactive Patient Education  2018 [employer].

## 2017-08-28 ENCOUNTER — Inpatient Hospital Stay (HOSPITAL_COMMUNITY)
Admission: EM | Admit: 2017-08-28 | Discharge: 2017-09-02 | DRG: 193 | Disposition: A | Discharge status: Home / Self Care | Payer: Medicaid Other | Attending: Family Medicine | Admitting: Family Medicine

## 2017-08-28 ENCOUNTER — Emergency Department (HOSPITAL_COMMUNITY): Payer: Medicaid Other

## 2017-08-28 ENCOUNTER — Ambulatory Visit: Payer: Medicaid Other | Admitting: Student

## 2017-08-28 ENCOUNTER — Encounter: Payer: Self-pay | Admitting: Student

## 2017-08-28 ENCOUNTER — Encounter (HOSPITAL_COMMUNITY): Payer: Self-pay | Admitting: Emergency Medicine

## 2017-08-28 VITALS — HR 112 | Temp 98.2°F

## 2017-08-28 DIAGNOSIS — E662 Morbid (severe) obesity with alveolar hypoventilation: Secondary | ICD-10-CM | POA: Diagnosis present

## 2017-08-28 DIAGNOSIS — N289 Disorder of kidney and ureter, unspecified: Secondary | ICD-10-CM

## 2017-08-28 DIAGNOSIS — Z9981 Dependence on supplemental oxygen: Secondary | ICD-10-CM

## 2017-08-28 DIAGNOSIS — R410 Disorientation, unspecified: Secondary | ICD-10-CM | POA: Diagnosis not present

## 2017-08-28 DIAGNOSIS — I509 Heart failure, unspecified: Secondary | ICD-10-CM | POA: Diagnosis not present

## 2017-08-28 DIAGNOSIS — G4730 Sleep apnea, unspecified: Secondary | ICD-10-CM | POA: Diagnosis present

## 2017-08-28 DIAGNOSIS — J9622 Acute and chronic respiratory failure with hypercapnia: Secondary | ICD-10-CM | POA: Diagnosis present

## 2017-08-28 DIAGNOSIS — J440 Chronic obstructive pulmonary disease with acute lower respiratory infection: Secondary | ICD-10-CM | POA: Diagnosis present

## 2017-08-28 DIAGNOSIS — Q871 Congenital malformation syndromes predominantly associated with short stature: Secondary | ICD-10-CM | POA: Diagnosis not present

## 2017-08-28 DIAGNOSIS — Z7984 Long term (current) use of oral hypoglycemic drugs: Secondary | ICD-10-CM

## 2017-08-28 DIAGNOSIS — J309 Allergic rhinitis, unspecified: Secondary | ICD-10-CM | POA: Diagnosis present

## 2017-08-28 DIAGNOSIS — Z6841 Body Mass Index (BMI) 40.0 and over, adult: Secondary | ICD-10-CM

## 2017-08-28 DIAGNOSIS — E875 Hyperkalemia: Secondary | ICD-10-CM | POA: Diagnosis present

## 2017-08-28 DIAGNOSIS — J189 Pneumonia, unspecified organism: Principal | ICD-10-CM | POA: Diagnosis present

## 2017-08-28 DIAGNOSIS — K219 Gastro-esophageal reflux disease without esophagitis: Secondary | ICD-10-CM | POA: Diagnosis present

## 2017-08-28 DIAGNOSIS — D649 Anemia, unspecified: Secondary | ICD-10-CM | POA: Diagnosis present

## 2017-08-28 DIAGNOSIS — R0602 Shortness of breath: Secondary | ICD-10-CM | POA: Diagnosis not present

## 2017-08-28 DIAGNOSIS — E874 Mixed disorder of acid-base balance: Secondary | ICD-10-CM | POA: Diagnosis present

## 2017-08-28 DIAGNOSIS — G252 Other specified forms of tremor: Secondary | ICD-10-CM | POA: Diagnosis present

## 2017-08-28 DIAGNOSIS — I110 Hypertensive heart disease with heart failure: Secondary | ICD-10-CM | POA: Diagnosis present

## 2017-08-28 DIAGNOSIS — E119 Type 2 diabetes mellitus without complications: Secondary | ICD-10-CM | POA: Diagnosis present

## 2017-08-28 DIAGNOSIS — Z79899 Other long term (current) drug therapy: Secondary | ICD-10-CM

## 2017-08-28 DIAGNOSIS — E039 Hypothyroidism, unspecified: Secondary | ICD-10-CM | POA: Diagnosis present

## 2017-08-28 DIAGNOSIS — N179 Acute kidney failure, unspecified: Secondary | ICD-10-CM | POA: Diagnosis present

## 2017-08-28 DIAGNOSIS — J9621 Acute and chronic respiratory failure with hypoxia: Secondary | ICD-10-CM

## 2017-08-28 DIAGNOSIS — I7300 Raynaud's syndrome without gangrene: Secondary | ICD-10-CM | POA: Diagnosis present

## 2017-08-28 DIAGNOSIS — R0603 Acute respiratory distress: Secondary | ICD-10-CM | POA: Diagnosis not present

## 2017-08-28 DIAGNOSIS — Z7951 Long term (current) use of inhaled steroids: Secondary | ICD-10-CM

## 2017-08-28 DIAGNOSIS — Z825 Family history of asthma and other chronic lower respiratory diseases: Secondary | ICD-10-CM

## 2017-08-28 DIAGNOSIS — N3944 Nocturnal enuresis: Secondary | ICD-10-CM | POA: Diagnosis present

## 2017-08-28 DIAGNOSIS — I5033 Acute on chronic diastolic (congestive) heart failure: Secondary | ICD-10-CM | POA: Diagnosis present

## 2017-08-28 DIAGNOSIS — R6889 Other general symptoms and signs: Secondary | ICD-10-CM | POA: Diagnosis not present

## 2017-08-28 DIAGNOSIS — E860 Dehydration: Secondary | ICD-10-CM | POA: Diagnosis present

## 2017-08-28 LAB — BLOOD GAS, ARTERIAL
Acid-Base Excess: 7.6 mmol/L — ABNORMAL HIGH (ref 0.0–2.0)
BICARBONATE: 35.3 mmol/L — AB (ref 20.0–28.0)
Drawn by: 406621
O2 CONTENT: 10 L/min
O2 SAT: 92.2 %
PATIENT TEMPERATURE: 98.6
PCO2 ART: 91.8 mmHg — AB (ref 32.0–48.0)
PO2 ART: 79.1 mmHg — AB (ref 83.0–108.0)
pH, Arterial: 7.209 — ABNORMAL LOW (ref 7.350–7.450)

## 2017-08-28 LAB — IRON AND TIBC
IRON: 15 ug/dL — AB (ref 45–182)
SATURATION RATIOS: 4 % — AB (ref 17.9–39.5)
TIBC: 405 ug/dL (ref 250–450)
UIBC: 390 ug/dL

## 2017-08-28 LAB — CBC
HCT: 37.3 % — ABNORMAL LOW (ref 39.0–52.0)
Hemoglobin: 10.5 g/dL — ABNORMAL LOW (ref 13.0–17.0)
MCH: 21.3 pg — AB (ref 26.0–34.0)
MCHC: 28.2 g/dL — AB (ref 30.0–36.0)
MCV: 75.8 fL — ABNORMAL LOW (ref 78.0–100.0)
Platelets: 287 10*3/uL (ref 150–400)
RBC: 4.92 MIL/uL (ref 4.22–5.81)
RDW: 24.6 % — AB (ref 11.5–15.5)
WBC: 10.1 10*3/uL (ref 4.0–10.5)

## 2017-08-28 LAB — BASIC METABOLIC PANEL
ANION GAP: 15 (ref 5–15)
BUN: 135 mg/dL — ABNORMAL HIGH (ref 6–20)
CO2: 31 mmol/L (ref 22–32)
Calcium: 8.5 mg/dL — ABNORMAL LOW (ref 8.9–10.3)
Chloride: 91 mmol/L — ABNORMAL LOW (ref 101–111)
Creatinine, Ser: 2.92 mg/dL — ABNORMAL HIGH (ref 0.61–1.24)
GFR, EST AFRICAN AMERICAN: 33 mL/min — AB (ref >60)
GFR, EST NON AFRICAN AMERICAN: 29 mL/min — AB (ref >60)
Glucose, Bld: 222 mg/dL — ABNORMAL HIGH (ref 65–99)
Potassium: 4.5 mmol/L (ref 3.5–5.1)
SODIUM: 137 mmol/L (ref 135–145)

## 2017-08-28 LAB — TROPONIN I: Troponin I: 0.03 ng/mL (ref <0.03)

## 2017-08-28 LAB — CREATININE, SERUM
CREATININE: 3.65 mg/dL — AB (ref 0.61–1.24)
GFR calc Af Amer: 25 mL/min — ABNORMAL LOW (ref >60)
GFR, EST NON AFRICAN AMERICAN: 22 mL/min — AB (ref >60)

## 2017-08-28 LAB — FERRITIN: Ferritin: 27 ng/mL (ref 24–336)

## 2017-08-28 LAB — CBC WITH DIFFERENTIAL/PLATELET
BASOS PCT: 0 %
Basophils Absolute: 0 10*3/uL (ref 0.0–0.1)
EOS ABS: 0.1 10*3/uL (ref 0.0–0.7)
Eosinophils Relative: 1 %
HCT: 35.9 % — ABNORMAL LOW (ref 39.0–52.0)
Hemoglobin: 10.3 g/dL — ABNORMAL LOW (ref 13.0–17.0)
LYMPHS ABS: 1.4 10*3/uL (ref 0.7–4.0)
Lymphocytes Relative: 14 %
MCH: 21.5 pg — AB (ref 26.0–34.0)
MCHC: 28.7 g/dL — AB (ref 30.0–36.0)
MCV: 75.1 fL — ABNORMAL LOW (ref 78.0–100.0)
MONO ABS: 0.6 10*3/uL (ref 0.1–1.0)
Monocytes Relative: 6 %
Neutro Abs: 8.4 10*3/uL — ABNORMAL HIGH (ref 1.7–7.7)
Neutrophils Relative %: 79 %
Platelets: 271 10*3/uL (ref 150–400)
RBC: 4.78 MIL/uL (ref 4.22–5.81)
RDW: 24.4 % — AB (ref 11.5–15.5)
WBC: 10.4 10*3/uL (ref 4.0–10.5)

## 2017-08-28 LAB — PROCALCITONIN: Procalcitonin: 1.29 ng/mL

## 2017-08-28 LAB — INFLUENZA PANEL BY PCR (TYPE A & B)
Influenza A By PCR: NEGATIVE
Influenza B By PCR: NEGATIVE

## 2017-08-28 LAB — SEDIMENTATION RATE: Sed Rate: 40 mm/hr — ABNORMAL HIGH (ref 0–16)

## 2017-08-28 LAB — GLUCOSE, CAPILLARY
GLUCOSE-CAPILLARY: 144 mg/dL — AB (ref 65–99)
Glucose-Capillary: 154 mg/dL — ABNORMAL HIGH (ref 65–99)

## 2017-08-28 LAB — BRAIN NATRIURETIC PEPTIDE: B NATRIURETIC PEPTIDE 5: 211.6 pg/mL — AB (ref 0.0–100.0)

## 2017-08-28 LAB — HEMOGLOBIN A1C
Hgb A1c MFr Bld: 7.4 % — ABNORMAL HIGH (ref 4.8–5.6)
Mean Plasma Glucose: 165.68 mg/dL

## 2017-08-28 LAB — D-DIMER, QUANTITATIVE: D-Dimer, Quant: 5.01 ug/mL-FEU — ABNORMAL HIGH (ref 0.00–0.50)

## 2017-08-28 LAB — I-STAT TROPONIN, ED: TROPONIN I, POC: 0.01 ng/mL (ref 0.00–0.08)

## 2017-08-28 LAB — TSH: TSH: 5.579 u[IU]/mL — AB (ref 0.350–4.500)

## 2017-08-28 LAB — T4, FREE: Free T4: 0.8 ng/dL (ref 0.61–1.12)

## 2017-08-28 MED ORDER — METHYLPREDNISOLONE SODIUM SUCC 125 MG IJ SOLR
125.0000 mg | Freq: Once | INTRAMUSCULAR | Status: AC
  Administered 2017-08-28: 125 mg via INTRAVENOUS
  Filled 2017-08-28: qty 2

## 2017-08-28 MED ORDER — IPRATROPIUM-ALBUTEROL 0.5-2.5 (3) MG/3ML IN SOLN
3.0000 mL | Freq: Four times a day (QID) | RESPIRATORY_TRACT | Status: DC
  Administered 2017-08-28 – 2017-09-02 (×20): 3 mL via RESPIRATORY_TRACT
  Filled 2017-08-28 (×20): qty 3

## 2017-08-28 MED ORDER — POLYETHYLENE GLYCOL 3350 17 G PO PACK: 17.0000 g | PACK | Freq: Every day | ORAL | Status: DC | PRN

## 2017-08-28 MED ORDER — FUROSEMIDE 10 MG/ML IJ SOLN
80.0000 mg | Freq: Every day | INTRAMUSCULAR | Status: DC
  Administered 2017-08-28 – 2017-08-29 (×2): 80 mg via INTRAVENOUS
  Filled 2017-08-28 (×2): qty 8

## 2017-08-28 MED ORDER — LEVOFLOXACIN IN D5W 750 MG/150ML IV SOLN
750.0000 mg | INTRAVENOUS | Status: DC
  Administered 2017-08-28 – 2017-08-30 (×2): 750 mg via INTRAVENOUS
  Filled 2017-08-28 (×2): qty 150

## 2017-08-28 MED ORDER — OSELTAMIVIR PHOSPHATE 30 MG PO CAPS
30.0000 mg | ORAL_CAPSULE | Freq: Two times a day (BID) | ORAL | Status: DC
  Filled 2017-08-28 (×2): qty 1

## 2017-08-28 MED ORDER — INSULIN ASPART 100 UNIT/ML ~~LOC~~ SOLN
0.0000 [IU] | Freq: Three times a day (TID) | SUBCUTANEOUS | Status: DC
  Administered 2017-08-29: 3 [IU] via SUBCUTANEOUS
  Administered 2017-08-29 (×2): 2 [IU] via SUBCUTANEOUS
  Administered 2017-08-30: 3 [IU] via SUBCUTANEOUS
  Administered 2017-08-30: 2 [IU] via SUBCUTANEOUS
  Administered 2017-08-30: 5 [IU] via SUBCUTANEOUS
  Administered 2017-08-31: 2 [IU] via SUBCUTANEOUS
  Administered 2017-09-01 (×2): 3 [IU] via SUBCUTANEOUS
  Administered 2017-09-02: 2 [IU] via SUBCUTANEOUS

## 2017-08-28 MED ORDER — MAGNESIUM SULFATE 2 GM/50ML IV SOLN
2.0000 g | Freq: Once | INTRAVENOUS | Status: AC
  Administered 2017-08-28: 2 g via INTRAVENOUS
  Filled 2017-08-28: qty 50

## 2017-08-28 MED ORDER — ENOXAPARIN SODIUM 40 MG/0.4ML ~~LOC~~ SOLN
40.0000 mg | SUBCUTANEOUS | Status: DC
  Administered 2017-08-28: 40 mg via SUBCUTANEOUS
  Filled 2017-08-28: qty 0.4

## 2017-08-28 MED ORDER — ACETAMINOPHEN 650 MG RE SUPP
650.0000 mg | Freq: Four times a day (QID) | RECTAL | Status: DC | PRN
  Administered 2017-08-28 – 2017-08-29 (×2): 650 mg via RECTAL
  Filled 2017-08-28 (×2): qty 1

## 2017-08-28 MED ORDER — ALBUTEROL SULFATE (2.5 MG/3ML) 0.083% IN NEBU: 2.5000 mg | INHALATION_SOLUTION | RESPIRATORY_TRACT | Status: DC | PRN

## 2017-08-28 MED ORDER — ALBUTEROL SULFATE (2.5 MG/3ML) 0.083% IN NEBU
5.0000 mg | INHALATION_SOLUTION | Freq: Once | RESPIRATORY_TRACT | Status: AC
  Administered 2017-08-28: 5 mg via RESPIRATORY_TRACT
  Filled 2017-08-28: qty 6

## 2017-08-28 MED ORDER — ACETAMINOPHEN 160 MG/5ML PO SOLN
1000.0000 mg | Freq: Once | ORAL | Status: AC
  Administered 2017-08-28: 1000 mg via ORAL
  Filled 2017-08-28: qty 40.6

## 2017-08-28 MED ORDER — IPRATROPIUM BROMIDE 0.02 % IN SOLN
0.5000 mg | Freq: Once | RESPIRATORY_TRACT | Status: AC
  Administered 2017-08-28: 0.5 mg via RESPIRATORY_TRACT
  Filled 2017-08-28: qty 2.5

## 2017-08-28 MED ORDER — ACETAMINOPHEN 325 MG PO TABS
650.0000 mg | ORAL_TABLET | Freq: Four times a day (QID) | ORAL | Status: DC | PRN
  Administered 2017-09-01 – 2017-09-02 (×3): 650 mg via ORAL
  Filled 2017-08-28 (×3): qty 2

## 2017-08-28 MED ORDER — LEVOFLOXACIN IN D5W 750 MG/150ML IV SOLN
750.0000 mg | INTRAVENOUS | Status: DC
  Filled 2017-08-28: qty 150

## 2017-08-28 MED ORDER — PANTOPRAZOLE SODIUM 40 MG IV SOLR
40.0000 mg | Freq: Every day | INTRAVENOUS | Status: DC
  Administered 2017-08-28 – 2017-09-01 (×5): 40 mg via INTRAVENOUS
  Filled 2017-08-28 (×5): qty 40

## 2017-08-28 MED ORDER — METHYLPREDNISOLONE SODIUM SUCC 125 MG IJ SOLR
125.0000 mg | Freq: Every day | INTRAMUSCULAR | Status: DC
  Administered 2017-08-28 – 2017-08-30 (×3): 125 mg via INTRAVENOUS
  Filled 2017-08-28 (×3): qty 2

## 2017-08-28 MED ORDER — INSULIN ASPART 100 UNIT/ML ~~LOC~~ SOLN
0.0000 [IU] | Freq: Every day | SUBCUTANEOUS | Status: DC
  Administered 2017-08-29: 2 [IU] via SUBCUTANEOUS
  Administered 2017-08-30: 3 [IU] via SUBCUTANEOUS

## 2017-08-28 MED ORDER — FLUTICASONE PROPIONATE 50 MCG/ACT NA SUSP
2.0000 | Freq: Every day | NASAL | Status: DC
  Administered 2017-08-29 – 2017-09-02 (×4): 2 via NASAL
  Filled 2017-08-28: qty 16

## 2017-08-28 MED ORDER — TECHNETIUM TO 99M ALBUMIN AGGREGATED
4.2000 | Freq: Once | INTRAVENOUS | Status: AC | PRN
  Administered 2017-08-28: 4.2 via INTRAVENOUS

## 2017-08-28 MED ORDER — TECHNETIUM TC 99M DIETHYLENETRIAME-PENTAACETIC ACID
31.0000 | Freq: Once | INTRAVENOUS | Status: AC | PRN
  Administered 2017-08-28: 31 via RESPIRATORY_TRACT

## 2017-08-28 MED ORDER — ACETAMINOPHEN 325 MG PO TABS: 650.0000 mg | ORAL_TABLET | Freq: Once | ORAL | Status: DC

## 2017-08-28 NOTE — Progress Notes (Signed)
CRITICAL VALUE ALERT  Critical Value: Trop 0.03  Date & Time Notied:  08/28/17    2258 Provider Notified:Family Medicine paged  Orders Received/Actions taken:waiting for call back

## 2017-08-28 NOTE — Progress Notes (Addendum)
Pharmacy Antibiotic Note  [name] is a 22 y.o. male admitted on 08/28/2017 with suspected community acquired pneumonia.  Pharmacy has been consulted for Levaquin dosing. Also empirically on Tamiflu. Cultures and viral panel/pcr are pending.   WBC is within normal limites, SCr is elevated at 2.92 with normalized CrCl ~ 40 mL/min.  Patient is currently afebrile. CXR appears worse and patient currently requiring BiPAP, therefore CCM is starting antibiotics.  Plan: Levaquin 750 mg IV every 48 hours.  De-escalate as possible.  Monitor renal function, culture results, and clinical status.   Weight: (!) 376 lb (170.6 kg)  Temp (24hrs), Avg:98.1 F (36.7 C), Min:97.8 F (36.6 C), Max:98.2 F (36.8 C)  Recent Labs  Lab 08/28/17 1206 08/28/17 1215  WBC  --  10.4  CREATININE 2.92*  --     Estimated Creatinine Clearance: 52.8 mL/min (A) (by C-G formula based on SCr of 2.92 mg/dL (H)).    No Known Allergies  Antimicrobials this admission: Levaquin 1/24 >>  Dose adjustments this admission:   Microbiology results: pending  Thank you for allowing pharmacy to be a part of this patient's care.  Link SnufferJessica Jacoby Ritsema, PharmD, BCPS, BCCCP Clinical Pharmacist Clinical phone 08/28/2017 until 11PM [phone] #[phone] After hours, please call #[phone] 08/28/2017 7:39 PM

## 2017-08-28 NOTE — Progress Notes (Signed)
Pt placed on bipap after critical ABG results (given to MD). Pt tolerating bipap very well and resting comfortably at this time. RT will continue to closely monitor pt

## 2017-08-28 NOTE — H&P (Signed)
[facility] Admission History and Physical Service Pager: [phone]  Patient name: [name] Medical record number: [id number] Date of birth: 04-04-1996 Age: 22 y.o. Gender: male  Primary Care Provider: Mayo, Pete Pelt, MD Consultants: PCCM Code Status: Full  Chief Complaint: Wheezing and shortness of breath  Assessment and Plan: ALDEN BENSINGER is a 22 y.o. male presenting with increased wheezing, shortness of breath, and increased oxygen requirement . PMH is significant for asthma, pulmonary HTN, Prader-Willi Syndrome, Type 2 diabetes.  Acute hypoxic hypercarbic respiratory failure  OHS: Patient has had increased shortness of breath that began Tuesday (1/22) and acutely worsened 1/24.  O2 dropped into 50%s in the clinic today and was placed on 15L.  At baseline, he is able to walk from house to car, carrying his own oxygen. He was at baseline yesterday evening.  Family contact was diagnosed with influenza last week.  Patient has home oxygen requirement of 2L.  Received BiPAP in the ED. He is morbidly obese with history of OHS.Most recent echo in July showing preserved ejection fraction and mild dilation of RV, EF 55-60%.D-dimer elevated to 5.01 in ED, but V/Q scan was negative for PE.  CXR difficult to read due to body habitus, but showed well expanded lungs, confluent interstitial and alveolar opacities bilaterally.  He was hypercarbic (CO2 91.8) in ED and placed back on BiPap.  He meets sepsis criteria with tachycardia, tachypnea, and AKI.  Possible causes of respiratory failure is pneumonia, sepsis, influenza, asthma flair, fluid overload.   - Admit to stepdown with Family Medicine, Attending Dr. Andria Frames - PCCM consulted, appreciate recommendations - consider repeat ABG/CXR if worsening resp status - albuterolq2hrs PRN, duoneb q6 - continue home meds of flonase, loratadine, and montelukast - Continue solumedrol, 125 mg daily - Lasix 80 mg IV daily,  strict I&Os, daily weights - trend troponin x3 - continue BiPAP, supplemental O2 to keep O2 saturation >90%, continuous pulse ox - Low threshold for antibiotics - Blood cultures, urine culture, Legionella and strep pneumo antigens, Influenza and RVP  AKI: Baseline Cr around 1.0, on admission elevated to 2.92.  Mother reports that he only has one functioning kidney.  Unsure reason for dysfunction.  Suspect this may be due to hypoxia. - Continue to monitor Creatinine.  - Avoid nephrotoxic agents  T2DM: Glucose of 222on admission. A1C on 02/27/17 was 6.6.On metformin 500 mg XR at home. - moderate SSI - qACHS CBGs  - repeat A1C - TSH and FT4  Enuresis: Has nighttime enuresis.  Takes oxybutynin and desmopressin at home - continue home oxybutynin at bedtime  - continue home desmopressin   HTN: BP of 152/106on admission. On HCTZ at home - hold home hydrochlorothiazide - will continue to monitor - Start Lasix 80 mg IV daily (as above)  Morbid Obesity: Secondary toPrader-Willi syndrome - limit to 1200 kcal a day - Nutrition consult  Resting tremor: Mother reports that this was a problem in the past, but had gotten better.  However she had seen more of the tremor recently.   - will continue to monitor  GERD Chronic. On PPI. Stable. - Protonix 40 mg IV  FEN/GI: NPO while on BiPap Prophylaxis: lovenox  Disposition: Admit to stepdown with Family Medicine.    History of Present Illness:  [name] is a 22 y.o. male presenting with increased wheezing, shortness of breath, and increased oxygen requirement . PMH is significant for asthma, pulmonary HTN, Prader-Willi Syndrome, Type 2 diabetes.  History was  primarily obtained from patient's mother due to patient's increased work of breathing.  New onset wheezing 2 days ago.  Patient also has a 20 pound weight gain over the last week.  Mother provided patient with albuterol nebulizers twice daily with some improvement though  patient of breath earlier today prior to being seen at the clinic at which point he was noted to have desaturations in the 56s.  Patient was also tachycardic in the 110s.  He was given duo nebs and 6 L O2 nasal cannula with improvements to the 90s.  Mother denies patient experiencing viral URI symptoms including rhinorrhea, productive cough, fevers or chills.  Patient has been able to maintain a regular diet up until today.  Mother denies patient experiencing nausea or vomiting, abdominal pain.  He was recently treated for abdominal cellulitis with completion of Keflex.  Mother does endorse patient being exposed to a relative who was diagnosed with the flu recently.  Patient sees Dr. Lamonte Sakai for pulmonology.  Review Of Systems: Per HPI with the following additions:   Review of Systems  Constitutional: Positive for malaise/fatigue. Negative for chills and fever.  HENT: Negative for congestion and sore throat.   Eyes: Negative for blurred vision and double vision.  Respiratory: Positive for cough, shortness of breath and wheezing. Negative for sputum production.   Cardiovascular: Positive for orthopnea and leg swelling. Negative for chest pain and palpitations.  Gastrointestinal: Negative for abdominal pain, constipation, diarrhea, nausea and vomiting.  Genitourinary: Negative for dysuria, frequency and urgency.  Musculoskeletal: Negative for myalgias and neck pain.  Skin: Negative for itching and rash.  Neurological: Positive for weakness and headaches. Negative for focal weakness.  Psychiatric/Behavioral: Negative for substance abuse. The patient is nervous/anxious.     Patient Active Problem List   Diagnosis Date Noted  . Cellulitis of abdominal wall 08/08/2017  . Calculus of gallbladder without cholecystitis without obstruction 05/07/2017  . Diastolic congestive heart failure ([place])   . Tremor bilateral hands 10/27/2014  . Diabetes mellitus type 2 in obese ([place]) 09/27/2014  . Chronic  respiratory failure ([place]) 09/15/2014  . Allergic rhinitis 01/07/2013  . Obesity hypoventilation syndrome ([place]) 10/05/2010  . VISUAL IMPAIRMENT 11/13/2009  . RAYNAUD'S SYNDROME 08/10/2009  . Essential hypertension, benign 01/07/2008  . ORTHOSTATIC PROTEINURIA 01/07/2008  . OBESITY, MORBID 02/27/2007  . 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 Asthma 10/16/2006  . SYMPTOM, ENURESIS, NOCTURNAL 10/16/2006    Past Medical History: Past Medical History:  Diagnosis Date  . Asthma   . Diabetes mellitus without complication ([place])   . Hypertension    Pulmomary htn  . Kidney dysfunction    left  . Obesity   . Obesity hypoventilation syndrome ([place])   . Prader-Willi syndrome   . Raynaud's phenomenon   . Sleep apnea     Past Surgical History: Past Surgical History:  Procedure Laterality Date  . NISSEN FUNDOPLICATION    . TONSILLECTOMY      Social History: Social History   Tobacco Use  . Smoking status: Never Smoker  . Smokeless tobacco: Never Used  Substance Use Topics  . Alcohol use: No  . Drug use: No   Additional social history: Lives with mom and younger sibling  Please also refer to relevant sections of EMR.  Family History: Family History  Problem Relation Age of Onset  . Migraines Mother    Mother has migraines  Allergies and Medications: No Known Allergies No current facility-administered medications on file prior to encounter.    Current Outpatient  Medications on File Prior to Encounter  Medication Sig Dispense Refill  . albuterol (PROVENTIL) (2.5 MG/3ML) 0.083% nebulizer solution inhale contents of 1 vial in nebulizer once daily 75 mL 12  . cetirizine (ZYRTEC) 10 MG tablet Take 1 tablet (10 mg total) by mouth daily. 90 tablet 1  . desmopressin (DDAVP) 0.2 MG tablet Take 0.4 mg by mouth at bedtime.     . fluticasone (FLONASE) 50 MCG/ACT nasal spray instill 2 sprays into each nostril once daily 16 g 6  . fluticasone (FLOVENT HFA) 110 MCG/ACT inhaler Inhale 2  puffs into the lungs 2 (two) times daily. 1 Inhaler 12  . hydrochlorothiazide (HYDRODIURIL) 25 MG tablet Take 1 tablet (25 mg total) by mouth daily. 90 tablet 3  . ibuprofen (ADVIL,MOTRIN) 200 MG tablet Take 600-800 mg by mouth every 6 (six) hours as needed for mild pain.    . metFORMIN (GLUCOPHAGE-XR) 500 MG 24 hr tablet take 1 tablet by mouth once daily WITH BREAKFAST 60 tablet 0  . montelukast (SINGULAIR) 10 MG tablet Take 1 tablet (10 mg total) by mouth at bedtime. 90 tablet 3  . olopatadine (PATANOL) 0.1 % ophthalmic solution Place 1 drop into both eyes 2 (two) times daily as needed for allergies.  0  . oxybutynin (DITROPAN) 5 MG tablet Take 2 tablets (10 mg total) by mouth at bedtime. 60 tablet 0  . pantoprazole (PROTONIX) 20 MG tablet Take 1 tablet (20 mg total) by mouth daily. 90 tablet 2  . PREVIDENT 5000 ENAMEL PROTECT 1.1-5 % PSTE Take 1 application by mouth 2 (two) times daily.   0  . PROAIR HFA 108 (90 Base) MCG/ACT inhaler inhale 2 puff by mouth every 6 hours if needed 8.5 Inhaler 0  . torsemide (DEMADEX) 20 MG tablet Take 3 tablets (60 mg total) by mouth daily. 90 tablet 3  . ACCU-CHEK AVIVA PLUS test strip as directed 100 each 12  . ACCU-CHEK SOFTCLIX LANCETS lancets Use as instructed 100 each 12  . calcium carbonate (TUMS - DOSED IN MG ELEMENTAL CALCIUM) 500 MG chewable tablet Chew 2 tablets (400 mg of elemental calcium total) by mouth 3 (three) times daily between meals as needed for indigestion or heartburn. 60 tablet 3  . cephALEXin (KEFLEX) 500 MG capsule Take 1 capsule (500 mg total) by mouth 4 (four) times daily. (Patient not taking: Reported on 08/28/2017) 40 capsule 0  . Lancets 30G MISC 1 Device by Does not apply route daily before breakfast. 100 each 3  . Misc. Devices MISC Please provide a pulse oximeter for the ear lobe. Patient has diagnosis of Raynaud, asthma 1 each 0  . Respiratory Therapy Supplies (NEBULIZER/ADULT MASK) KIT 1 Device by Does not apply route once. 1  each 0  . Respiratory Therapy Supplies MISC BiPAP IPAP:20, EPAP: 8, back up respiratory rate: 10, FIO2: 35%. 1 each 0  . Spacer/Aero-Holding Chambers (AEROCHAMBER MV) inhaler Use with albuterol inhaler 1 each 1    Objective: BP (!) 144/89   Pulse 84   Temp 98.2 F (36.8 C) (Oral)   Resp (!) 24   Wt (!) 376 lb (170.6 kg)   SpO2 94%   BMI 79.96 kg/m  Exam: General: morbidly obese, moderate respiratory distress on BiPAP with non-toxic appearance HEENT: normocephalic, atraumatic, moist mucous membranes Neck: supple, non-tender without lymphadenopathy Cardiovascular: distant heart sounds with regular rate and rhythm without murmurs, rubs, or gallops Lungs: bilaterally wheeze though difficult to appreciate due to large body habitus Abdomen: soft, non-tender, non-distended,  normoactive bowel sounds Skin: warm, dry, several small healing scabs on belly, cap refill < 2 seconds Extremities: warm and well perfused, normal tone, mild pitting edema up to ankles Neuro: A&Ox4, grossly intact throughout, moves all 4 extremities  Labs and Imaging: CBC BMET  Recent Labs  Lab 08/28/17 1215  WBC 10.4  HGB 10.3*  HCT 35.9*  PLT 271   Recent Labs  Lab 08/28/17 1206  NA 137  K 4.5  CL 91*  CO2 31  BUN 135*  CREATININE 2.92*  GLUCOSE 222*  CALCIUM 8.5*     CXR:  CHF with pulmonary interstitial and alveolar edema. One certainly cannot exclude superimposed pneumonia. V/Q scan: Negative for PE  Landry Dyke, Medical Student 08/28/2017, 4:55 PM [place] [facility] pager: [phone], text pages welcome  Upper Level Addendum: I have seen and evaluated this patient along with Landry Dyke and reviewed the above note, making necessary revisions in blue.  Harriet [place], [place], PGY-2

## 2017-08-28 NOTE — ED Notes (Signed)
Pt's mother given crackers and a sandwich.  Pt resting on bipap.

## 2017-08-28 NOTE — Progress Notes (Signed)
Pt taken off NRB and placed on 10L HFNC. VS within normal limits.

## 2017-08-28 NOTE — ED Provider Notes (Addendum)
[facility] EMERGENCY DEPARTMENT Provider Note   CSN: [id number] Arrival date & time: 08/28/17  1142     History   Chief Complaint Chief Complaint  Patient presents with  . Shortness of Breath    HPI [name] is a 22 y.o. male.  HPI   22 year old obese male with history of asthma, pulmonary hypertension, chronic respiratory failure diabetes, sleep apnea, hypertension, Prader-Willi syndrome brought here via EMS accompanied by his parent from PCP office for evaluation of shortness of breath.  Per parent, for the past 2 days patient has had shortness of breath and wheezing.  Mom felt that it may be due to his asthma and has been giving him albuterol nebulizer twice daily.  States that he was doing fine.  Today they went to the primary care office and patient became hypoxic even with short walk to the car.  At the office, his O2 was found in the 50s, patient was given DuoNeb and placed on nonrebreather and sent here to the ER for further evaluation.  Mom report patient has been functional, walking around not having any significant respiratory discomfort aside from wheezing for the past few days.  He is on chronic home O2 at 2 L.  He has an occasional cough and has been complaining of headache.  No report of fever, runny nose sneezing sore throat nausea vomiting or diarrhea or dysuria.  No complaints of chest pain.  No prior history of PE or DVT.  No recent surgery or prolonged bed rest.  Mom did report that patient has been gaining weight from 354-376 pounds within the past week.  Past Medical History:  Diagnosis Date  . Asthma   . Diabetes mellitus without complication ([place])   . Hypertension    Pulmomary htn  . Kidney dysfunction    left  . Obesity   . Obesity hypoventilation syndrome ([place])   . Prader-Willi syndrome   . Raynaud's phenomenon   . Sleep apnea     Patient Active Problem List   Diagnosis Date Noted  . Cellulitis of abdominal wall 08/08/2017  .  Calculus of gallbladder without cholecystitis without obstruction 05/07/2017  . Diastolic congestive heart failure ([place])   . Tremor bilateral hands 10/27/2014  . Diabetes mellitus type 2 in obese ([place]) 09/27/2014  . Chronic respiratory failure ([place]) 09/15/2014  . Allergic rhinitis 01/07/2013  . Obesity hypoventilation syndrome ([place]) 10/05/2010  . VISUAL IMPAIRMENT 11/13/2009  . RAYNAUD'S SYNDROME 08/10/2009  . Essential hypertension, benign 01/07/2008  . ORTHOSTATIC PROTEINURIA 01/07/2008  . OBESITY, MORBID 02/27/2007  . 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 Asthma 10/16/2006  . SYMPTOM, ENURESIS, NOCTURNAL 10/16/2006    Past Surgical History:  Procedure Laterality Date  . NISSEN FUNDOPLICATION    . TONSILLECTOMY         Home Medications    Prior to Admission medications   Medication Sig Start Date End Date Taking? Authorizing Provider  ACCU-CHEK AVIVA PLUS test strip as directed 11/02/15   Leone Haven, MD  ACCU-CHEK SOFTCLIX LANCETS lancets Use as instructed 11/02/15   Melancon, York Ram, MD  albuterol (PROVENTIL) (2.5 MG/3ML) 0.083% nebulizer solution inhale contents of 1 vial in nebulizer once daily 04/16/17   Mayo, Pete Pelt, MD  calcium carbonate (TUMS - DOSED IN MG ELEMENTAL CALCIUM) 500 MG chewable tablet Chew 2 tablets (400 mg of elemental calcium total) by mouth 3 (three) times daily between meals as needed for indigestion or heartburn. 04/30/17   Sela Hilding, MD  cephALEXin (KEFLEX) 500 MG capsule Take 1 capsule (500 mg total) by mouth 4 (four) times daily. 08/06/17   Mercy Riding, MD  cetirizine (ZYRTEC) 10 MG tablet Take 1 tablet (10 mg total) by mouth daily. 03/27/17   Mayo, Pete Pelt, MD  desmopressin (DDAVP) 0.2 MG tablet Take 0.4 mg by mouth at bedtime.     [provider]  fluticasone (FLONASE) 50 MCG/ACT nasal spray instill 2 sprays into each nostril once daily 12/02/16   Haney, Alyssa A, MD  fluticasone (FLOVENT HFA) 110 MCG/ACT inhaler Inhale 2  puffs into the lungs 2 (two) times daily. 04/16/17   Mayo, Pete Pelt, MD  hydrochlorothiazide (HYDRODIURIL) 25 MG tablet Take 1 tablet (25 mg total) by mouth daily. 03/06/17   Mercy Riding, MD  ibuprofen (ADVIL,MOTRIN) 200 MG tablet Take 600-800 mg by mouth every 6 (six) hours as needed for mild pain.    [provider]  Lancets 30G MISC 1 Device by Does not apply route daily before breakfast. 10/06/14   Leone Haven, MD  metFORMIN (GLUCOPHAGE-XR) 500 MG 24 hr tablet take 1 tablet by mouth once daily WITH BREAKFAST 04/16/17   Mayo, Pete Pelt, MD  Misc. Devices MISC Please provide a pulse oximeter for the ear lobe. Patient has diagnosis of Raynaud, asthma 09/16/11   Judithann Sheen, MD  montelukast (SINGULAIR) 10 MG tablet Take 1 tablet (10 mg total) by mouth at bedtime. 04/16/17   Mayo, Pete Pelt, MD  olopatadine (PATANOL) 0.1 % ophthalmic solution Place 1 drop into both eyes 2 (two) times daily as needed for allergies. 01/20/17   [provider]  oxybutynin (DITROPAN) 5 MG tablet Take 2 tablets (10 mg total) by mouth at bedtime. 04/30/17   Sela Hilding, MD  pantoprazole (PROTONIX) 20 MG tablet Take 1 tablet (20 mg total) by mouth daily. 04/30/17   Sela Hilding, MD  PREVIDENT 5000 ENAMEL PROTECT 1.1-5 % PSTE Take 1 application by mouth 2 (two) times daily.  01/14/17   [provider]  PROAIR HFA 108 (90 Base) MCG/ACT inhaler inhale 2 puff by mouth every 6 hours if needed 05/09/16   Collene Gobble, MD  Respiratory Therapy Supplies (NEBULIZER/ADULT MASK) KIT 1 Device by Does not apply route once. 05/20/14   Leone Haven, MD  Respiratory Therapy Supplies MISC BiPAP IPAP:20, EPAP: 8, back up respiratory rate: 10, FIO2: 35%. 04/30/17   Sela Hilding, MD  Spacer/Aero-Holding Chambers (AEROCHAMBER MV) inhaler Use with albuterol inhaler 04/20/12   Verdie Drown, Samuel Germany, MD  torsemide (DEMADEX) 20 MG tablet Take 3 tablets (60 mg total) by mouth daily. 06/04/17    Collene Gobble, MD    Family History No family history on file.  Social History Social History   Tobacco Use  . Smoking status: Never Smoker  . Smokeless tobacco: Never Used  Substance Use Topics  . Alcohol use: No  . Drug use: No     Allergies   Patient has no known allergies.   Review of Systems Review of Systems  Unable to perform ROS: Severe respiratory distress     Physical Exam Updated Vital Signs SpO2 95%   Physical Exam  Constitutional: He is oriented to person, place, and time.  Morbidly obese, appears uncomfortable, tachypneic  HENT:  Head: Normocephalic and atraumatic.  Lips are dry  Eyes: EOM are normal. Pupils are equal, round, and reactive to light.  Dry crust noted on eyelids  Neck: Normal range of motion. Neck  supple. No JVD present.  Cardiovascular:  Tachycardic  Pulmonary/Chest: He has decreased breath sounds. He has wheezes (Decrease breath sounds with poor effort, faint wheezes heard).  Abdominal: Soft.  Musculoskeletal:       Right lower leg: He exhibits no edema.       Left lower leg: He exhibits no edema.  No obvious peripheral edema however difficult to assess due to large body habitus  Neurological: He is alert and oriented to person, place, and time.  Skin: Skin is warm. Capillary refill takes less than 2 seconds.  Psychiatric: His mood appears anxious.  Nursing note and vitals reviewed.    ED Treatments / Results  Labs (all labs ordered are listed, but only abnormal results are displayed) Labs Reviewed  BASIC METABOLIC PANEL - Abnormal; Notable for the following components:      Result Value   Chloride 91 (*)    Glucose, Bld 222 (*)    BUN 135 (*)    Creatinine, Ser 2.92 (*)    Calcium 8.5 (*)    GFR calc non Af Amer 29 (*)    GFR calc Af Amer 33 (*)    All other components within normal limits  BRAIN NATRIURETIC PEPTIDE - Abnormal; Notable for the following components:   B Natriuretic Peptide 211.6 (*)    All other  components within normal limits  D-DIMER, QUANTITATIVE (NOT AT [facility]) - Abnormal; Notable for the following components:   D-Dimer, Quant 5.01 (*)    All other components within normal limits  CBC WITH DIFFERENTIAL/PLATELET - Abnormal; Notable for the following components:   Hemoglobin 10.3 (*)    HCT 35.9 (*)    MCV 75.1 (*)    MCH 21.5 (*)    MCHC 28.7 (*)    RDW 24.4 (*)    Neutro Abs 8.4 (*)    All other components within normal limits  BLOOD GAS, ARTERIAL  I-STAT TROPONIN, ED    EKG  EKG Interpretation None      Date: 08/28/2017  Rate: 91  Rhythm: normal sinus rhythm  QRS Axis: normal  Intervals: normal  ST/T Wave abnormalities: normal  Conduction Disutrbances: none  Narrative Interpretation:   Old EKG Reviewed: No significant changes noted     Radiology Dg Chest Portable 1 View  Result Date: 08/28/2017 CLINICAL DATA:  Two days of shortness of breath. History of chronic respiratory failure, pulmonary hypertension, CHF, and asthma. Also morbid obesity. EXAM: PORTABLE CHEST 1 VIEW COMPARISON:  Portable chest x-ray of April 29, 2017 FINDINGS: The images are limited due to scatter effects secondary to the patient's body habitus. The lungs are well-expanded. There are confluent interstitial and alveolar opacities bilaterally. The cardiac silhouette is enlarged. The pulmonary vascularity is engorged. The trachea is midline. There is no pleural effusion. The observed bony structures are unremarkable. IMPRESSION: CHF with pulmonary interstitial and alveolar edema. One certainly cannot exclude superimposed pneumonia. Electronically Signed   By: David  Martinique M.D.   On: 08/28/2017 13:01    Procedures Procedures (including critical care time)  Medications Ordered in ED Medications  methylPREDNISolone sodium succinate (SOLU-MEDROL) 125 mg/2 mL injection 125 mg (125 mg Intravenous Given 08/28/17 1256)  magnesium sulfate IVPB 2 g 50 mL (0 g Intravenous Stopped 08/28/17 1357)    albuterol (PROVENTIL) (2.5 MG/3ML) 0.083% nebulizer solution 5 mg (5 mg Nebulization Given 08/28/17 1239)  ipratropium (ATROVENT) nebulizer solution 0.5 mg (0.5 mg Nebulization Given 08/28/17 1241)  acetaminophen (TYLENOL) solution 1,000 mg (1,000 mg Oral  Given 08/28/17 1256)     Initial Impression / Assessment and Plan / ED Course  I have reviewed the triage vital signs and the nursing notes.  Pertinent labs & imaging results that were available during my care of the patient were reviewed by me and considered in my medical decision making (see chart for details).     BP 132/84   Pulse 99   Temp 98.2 F (36.8 C) (Oral)   Resp (!) 30   Wt (!) 170.6 kg (376 lb)   SpO2 92%   BMI 79.96 kg/m    Final Clinical Impressions(s) / ED Diagnoses   Final diagnoses:  Shortness of breath  Acute renal insufficiency  Congestive heart failure, unspecified HF chronicity, unspecified heart failure type [facility])    ED Discharge Orders    None     12:16 PM Patient with significant pulmonary disease here with acute onset of shortness of breath.  Has been having increased wheezing and shortness of breath the past 2 days but symptoms became much worse today.  He was found to be hypoxic with O2 in the 50s at the PCPs office and was sent here.  His O2 has improved to 90s with nonrebreather.  Does have history of asthma and is wheezing likely an asthma exacerbation.  However, due to the sudden onset of his symptom along with large body habitus, a d-dimer obtained to assess for potential PE.  Endorsed headache, no nuchal rigidity, Tylenol given for headache.  Duo nebs, Solu-Medrol, and magnesium sulfate given.  1:52 PM Chest x-ray shows CHF with pulmonary interstitial and alveolar edema.  Cannot exclude superimposed pneumonia.  However, patient is afebrile, does not complaining of productive cough and no leukocytosis to suggest pneumonia.  Labs remarkable for an e patient has an elevated d-dimer 5.01.   Levated BNP of 211.  Also an elevated creatinine of 2.92, BUN 135.  This is a change from his baseline.  Mild anemia with hemoglobin of 10.3.  Initial troponin is negative.  Due to impaired renal function, a VQ scan was ordered to rule out PE.  Patient will need to be admitted for further management of his hypoxia.  2:09 PM Pt became hypoxic after going off non rebreather.  Will place on BiPAP, in the setting of CHF exacerbation. Will consult for admission.   2:21 PM Appreciate consultation from [facility] Resident who agrees to see pt in the ER and will admit for further evaluation and management of his hypoxia.    CRITICAL CARE Performed by: Domenic Moras Total critical care time: 35 minutes Critical care time was exclusive of separately billable procedures and treating other patients. Critical care was necessary to treat or prevent imminent or life-threatening deterioration. Critical care was time spent personally by me on the following activities: development of treatment plan with patient and/or surrogate as well as nursing, discussions with consultants, evaluation of patient's response to treatment, examination of patient, obtaining history from patient or surrogate, ordering and performing treatments and interventions, ordering and review of laboratory studies, ordering and review of radiographic studies, pulse oximetry and re-evaluation of patient's condition.    Domenic Moras, PA-C 08/28/17 1502    Mesner, Corene Cornea, MD 08/28/17 1536    Domenic Moras, PA-C 08/28/17 1547    Mesner, Corene Cornea, MD 08/28/17 [phone]

## 2017-08-28 NOTE — ED Triage Notes (Addendum)
Per [facility]: Pt to ED from family medicine practice with c/o SOB x 2 days - pt has hx asthma, pulmonary HTN, chronic respiratory failure, sleep apnea, on 2L O2 [place] at home all the time. Patient accompanied by his mother, who denies fevers/cough. Patient reported to have SpO2 in 50s at doctor's office - given duoneb, which increased sat to high 80s. Pt had total 2 duonebs, placed on NRB on arrival - SpO2 in 70s. Currently 98% on 15L. Pt's mother reports pt has been moving around well at home, eating well, until this morning. EMS VS: 170/110, P 110 regular, 95% with neb treatment, RR 24, CBG 232. Patient's mother states she has been giving pt neb treatments morning and night past couple days.

## 2017-08-28 NOTE — ED Notes (Signed)
EDP at bedside  

## 2017-08-28 NOTE — Progress Notes (Signed)
I saw and examined [name].  Discussed with Dr. Abelardo DieselMcMullen.  I will co-sign the H&PE when available.  Briefly, Patient with known multiple chronic medical problems has 3 days of worsening SOB.  [facility], found to be hypoxic transferred to ER.  Issues: 1. Acute on chronic hypoxic and hypercarbic resp failure.  Now on bipap with good O2s.  Will go to stepdown.  Low suspicion for pneumonia.    Best guess is vicious cycle of CHF causing fluid retention causing worsening hypoxia causing worsening renal function causing worsening fluid retention causing worsening CHF and resp failure, etc.  2. Acute on chronic HFpEF 3. AKI presumed secondary to hypoxia. 4. Morbid obesity.  Plan is resp support with bipap.  Diuresis despite AKI.  Monitor renal function.  Stepdown care.  Culture.  No antibiotics for now.  Start antibiotics if fever.

## 2017-08-28 NOTE — Progress Notes (Signed)
Pt transported from ED B14 to 2C02 on bipap. Pt stable throughout with no complications. VS within normal limits. Report given to receiving RT by phone. RT will continue to monitor.

## 2017-08-28 NOTE — Progress Notes (Signed)
FPTS Interim Progress Note  Patient easily arouseable and looked comfortable on BiPAP, sating mid 90s. Stated he felt ok. Spoke with mom, answered questions. Will provide condom catheter for strict I&Os as patient has difficulty getting to the bathroom in time to void.  BP (!) 143/94   Pulse 78   Temp 98.2 F (36.8 C) (Oral)   Resp (!) 24   Wt (!) 376 lb (170.6 kg)   SpO2 99%   BMI 79.96 kg/m    [name], [name], DO 08/28/2017, 7:03 PM PGY-1, [facility] pager [phone]

## 2017-08-28 NOTE — ED Notes (Signed)
Patient's mother adds approx. 22lb. weight gain since last Thursday or Friday.

## 2017-08-28 NOTE — Progress Notes (Signed)
Pt desaturated to 78% when head layed back and pt turned, pt quickly recovers with sats in 90's still on bipap at 50%. Spoke with RT, CT and pt's mother. Will give pt's scheduled lasix and reevaluate if he will be able to tolerate laying flat in CT.

## 2017-08-28 NOTE — ED Notes (Signed)
Found pt standing at foot of bed-- oxygen off-- not reaching to pt, mother at side-- pt states he needs to have a bm-- pt assisted to bedside commode with no bm resulting.  Assisted into bed w 5 assist. O2 on NRB

## 2017-08-28 NOTE — Consult Note (Signed)
PULMONARY / CRITICAL CARE MEDICINE   Name: FREDERIK STANDLEY MRN: [id number] DOB: May 06, 1996    ADMISSION DATE:  08/28/2017 CONSULTATION DATE:  08/28/2017  REFERRING MD:  Family medicine resident service  CHIEF COMPLAINT:  Wheezing and SOB   HISTORY OF PRESENT ILLNESS:   MAHDI FRYE is a 26 yoM with obesity hyperventilation syndrome presenting with increased wheezing, shortness of breath, and higher than baseline oxygen demand. 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 Agustina Caroli most recent clinic note. As per mother, he was in his usual state of health until this past Thursday 1/17 when he began to develop progressively worsening wheezing. In addition he had fallen asleep which is unusual for him. She stated that in addition he gained ~20lbs of weight but otherwise had been acting normally. She obtained a clinic visit due to the wheezing and weight gain whereupon it was noted that he was hypoxic on digital recording to ~50%. He was sent to the ED from the clinic. Mother attested that a close family contact was recently diagnosed with influenza.   At the ED he was noted to desaturate on two separate occassions whereupon he became very sleepy. He was placed on his BiPAP at his home settings with improvement. ABG 7.209/91.8/79.1/35.3, CBC unremarkable with WBC 10.4 and chronic anemia near baseline 10.3. BNP elevated near previously recorded from four months prior at 211.6. BMP indicated serum Cr 2.92 with Na/K of 137/4.5. This is indicative of an acute worsening in creatinine consistent with acute renal injury. D-dimer was elevated with VQ scan without significant perfusion defect noted.   PCCM was consulted due to worsening respiratory status.  PAST MEDICAL HISTORY :  He  has a past medical history of Asthma, Diabetes mellitus without  complication ([place]), Hypertension, Kidney dysfunction, Obesity, Obesity hypoventilation syndrome ([place]), Prader-Willi syndrome, Raynaud's phenomenon, and Sleep apnea.  PAST SURGICAL HISTORY: He  has a past surgical history that includes Nissen fundoplication and Tonsillectomy.  No Known Allergies  No current facility-administered medications on file prior to encounter.    Current Outpatient Medications on File Prior to Encounter  Medication Sig  . albuterol (PROVENTIL) (2.5 MG/3ML) 0.083% nebulizer solution inhale contents of 1 vial in nebulizer once daily  . cetirizine (ZYRTEC) 10 MG tablet Take 1 tablet (10 mg total) by mouth daily.  Marland Kitchen desmopressin (DDAVP) 0.2 MG tablet Take 0.4 mg by mouth at bedtime.   . fluticasone (FLONASE) 50 MCG/ACT nasal spray instill 2 sprays into each nostril once daily  . fluticasone (FLOVENT HFA) 110 MCG/ACT inhaler Inhale 2 puffs into the lungs 2 (two) times daily.  . hydrochlorothiazide (HYDRODIURIL) 25 MG tablet Take 1 tablet (25 mg total) by mouth daily.  Marland Kitchen ibuprofen (ADVIL,MOTRIN) 200 MG tablet Take 600-800 mg by mouth every 6 (six) hours as needed for mild pain.  . metFORMIN (GLUCOPHAGE-XR) 500 MG 24 hr tablet take 1 tablet by mouth once daily WITH BREAKFAST  . montelukast (SINGULAIR) 10 MG tablet Take 1 tablet (10 mg total) by mouth at bedtime.  Marland Kitchen olopatadine (PATANOL) 0.1 % ophthalmic solution Place 1 drop into both eyes 2 (two) times daily as needed for allergies.  Marland Kitchen oxybutynin (DITROPAN) 5 MG tablet Take 2 tablets (10 mg total) by mouth at bedtime.  . pantoprazole (PROTONIX) 20 MG tablet Take 1 tablet (20 mg total) by mouth daily.  Marland Kitchen  PREVIDENT 5000 ENAMEL PROTECT 1.1-5 % PSTE Take 1 application by mouth 2 (two) times daily.   Marland Kitchen PROAIR HFA 108 (90 Base) MCG/ACT inhaler inhale 2 puff by mouth every 6 hours if needed  . torsemide (DEMADEX) 20 MG tablet Take 3 tablets (60 mg total) by mouth daily.  Marland Kitchen ACCU-CHEK AVIVA PLUS test strip as directed  . ACCU-CHEK  SOFTCLIX LANCETS lancets Use as instructed  . calcium carbonate (TUMS - DOSED IN MG ELEMENTAL CALCIUM) 500 MG chewable tablet Chew 2 tablets (400 mg of elemental calcium total) by mouth 3 (three) times daily between meals as needed for indigestion or heartburn.  . cephALEXin (KEFLEX) 500 MG capsule Take 1 capsule (500 mg total) by mouth 4 (four) times daily. (Patient not taking: Reported on 08/28/2017)  . Lancets 30G MISC 1 Device by Does not apply route daily before breakfast.  . Misc. Devices MISC Please provide a pulse oximeter for the ear lobe. Patient has diagnosis of Raynaud, asthma  . Respiratory Therapy Supplies (NEBULIZER/ADULT MASK) KIT 1 Device by Does not apply route once.  Marland Kitchen Respiratory Therapy Supplies MISC BiPAP IPAP:20, EPAP: 8, back up respiratory rate: 10, FIO2: 35%.  Marland Kitchen Spacer/Aero-Holding Chambers (AEROCHAMBER MV) inhaler Use with albuterol inhaler    FAMILY HISTORY:  His indicated that his mother is alive. He indicated that his father is alive. He indicated that his sister is alive. He indicated that his brother is alive.  SOCIAL HISTORY: He  reports that  has never smoked. he has never used smokeless tobacco. He reports that he does not drink alcohol or use drugs.  REVIEW OF SYSTEMS:   General: Patient denied concerns other than being hungry and having mild head pain Neuro: denied numbness, tingling or weakness HEENT: denied visual changes, attested to headache today only Cardio: denied palpitations, or chest pain Pulm: attested to SOB, wheezing but denied cough or sputum GI: denied abdominal pain, nausea or vomiting [place]: denied pedal edema, denied joint pain   Skin: denied rash, erythema, bruising or skin breakdown   SUBJECTIVE:  Patient was resting in bed, wants to know when he can eat.   VITAL SIGNS: BP (!) 143/94   Pulse 86   Temp 98.2 F (36.8 C) (Oral)   Resp (!) 24   Wt (!) 376 lb (170.6 kg)   SpO2 94%   BMI 79.96 kg/m   HEMODYNAMICS:   VENTILATOR  SETTINGS:   INTAKE / OUTPUT: No intake/output data recorded.  PHYSICAL EXAMINATION: General: in NAD, on BiPAP, appears comfortable in bed Neuro: No focal deficits, moving all limbs on command, answers questions at baseline HEENT: Mucus membranes moist, PERRL, EOMI Cardiovascular: tachy, no murmur auscultated but due to body habitus, heart sounds are diminished.  Pulmonary: mild expiratory wheezing in bilateral upper lung fields, no crackles or rhonchi noted Abdomen:  Soft nontender, nondistended  Musculoskeletal:  nonedematous BLE, no evidence of volmue overload, dorsalis pedis pulses appreciable bilaterally  Skin:  Skin is dry, nondiaphoretic   LABS:  BMET Recent Labs  Lab 08/28/17 1206  NA 137  K 4.5  CL 91*  CO2 31  BUN 135*  CREATININE 2.92*  GLUCOSE 222*    Electrolytes Recent Labs  Lab 08/28/17 1206  CALCIUM 8.5*    CBC Recent Labs  Lab 08/28/17 1215  WBC 10.4  HGB 10.3*  HCT 35.9*  PLT 271    Coag's No results for input(s): APTT, INR in the last 168 hours.  Sepsis Markers No results for input(s): LATICACIDVEN,  PROCALCITON, O2SATVEN in the last 168 hours.  ABG Recent Labs  Lab 08/28/17 1555  PHART 7.209*  PCO2ART 91.8*  PO2ART 79.1*    Liver Enzymes No results for input(s): AST, ALT, ALKPHOS, BILITOT, ALBUMIN in the last 168 hours.  Cardiac Enzymes No results for input(s): TROPONINI, PROBNP in the last 168 hours.  Glucose No results for input(s): GLUCAP in the last 168 hours.  Imaging Nm Pulmonary Vent And Perf (v/q Scan)  Result Date: 08/28/2017 CLINICAL DATA:  Acute respiratory failure and shortness of Breath EXAM: NUCLEAR MEDICINE VENTILATION - PERFUSION LUNG SCAN TECHNIQUE: Ventilation images were obtained in multiple projections using inhaled aerosol Tc-72mDTPA. Perfusion images were obtained in multiple projections after intravenous injection of Tc-964mAA. RADIOPHARMACEUTICALS:  Thirty-one mCi of Tc-9936mPA aerosol inhalation  and 4.2 mCi Tc99m51m-IV COMPARISON:  Chest x-ray from earlier in the same day. FINDINGS: Ventilation: Ventilation images are significantly limited with only two views obtained due to patient limitations. Some overall decrease in ventilation is noted likely related to the diffuse edema seen on recent plain film exam. Perfusion: Limited to two views due to patient limitations. Adequate perfusion is noted throughout both lungs. Cardiac shadow remains enlarged. IMPRESSION: Significantly limited exam although no perfusion defect to suggest pulmonary embolism is noted. Electronically Signed   By: MarkInez Catalina.   On: 08/28/2017 16:10   Dg Chest Portable 1 View  Result Date: 08/28/2017 CLINICAL DATA:  Two days of shortness of breath. History of chronic respiratory failure, pulmonary hypertension, CHF, and asthma. Also morbid obesity. EXAM: PORTABLE CHEST 1 VIEW COMPARISON:  Portable chest x-ray of April 29, 2017 FINDINGS: The images are limited due to scatter effects secondary to the patient's body habitus. The lungs are well-expanded. There are confluent interstitial and alveolar opacities bilaterally. The cardiac silhouette is enlarged. The pulmonary vascularity is engorged. The trachea is midline. There is no pleural effusion. The observed bony structures are unremarkable. IMPRESSION: CHF with pulmonary interstitial and alveolar edema. One certainly cannot exclude superimposed pneumonia. Electronically Signed   By: David  JordMartinique.   On: 08/28/2017 13:01   STUDIES:  VQ scan-non indicative of perfusion defect, exam not consistent with PE  DISCUSSION: 21 [facility]82 [facility] worsening dyspnea and hypoxemia at the outpatient clinic. PCCM was consulted for respiratory status with ABG indicating a pH of 7.209/91.8/79.1/35.3 being indicative for notably acutely worsening chronic respiratory acidosis with metabolic alkalosis. Patient on BiPAP at home setting of 20/8 with  FiO2 increased to 50%.   ASSESSMENT / PLAN:  PULMONARY A: Acute on chronic hypercapnic respiratory distress-placed on BiPAP with home settings  No current indication to repeat ABG as the hypercapnia appears chronic in nature  ABG as above, adjusted BiPAP settings to home setting CXR of poor quality given body habitus, unable to sufficiently assess for significant variation from prior exams. Agree that pneumonia vs interstitial edema very possible but unclear.  P:   Titrate O2 down to SpO2 of ~90-92% Continue BiPAP support Agree with:      Albuterol q2 hours PRN    Duonebs q6 hours    Solumedrol 125mg67mly  Would not suggest ABG unless patients status worsens  Would suggest slightly lower dose of IV lasix given as patient has no clear evidence of volume overload. Agree at holding antibiotics unless patient deteriorates  Urine, blood, influenza, RVP, Legionella and strep pneumo pending.  Trend fever, WBC curve  Other problems as per Primary team.  Kathi Ludwig, MD Resident with Pulmonary and [place] Pager: [phone]  08/28/2017, 6:12 PM

## 2017-08-28 NOTE — Progress Notes (Signed)
  Subjective:    [name] is a 22 y.o. old male with dyspnea and wheezing.  He is here with his caretaker.  HPI [name] is a 22 year old wheelchair-bound male with history of morbid obesity, asthma, obesity hypoventilation and diastolic CHF who presented with 2 days of dyspnea and wheezing that has gotten worse since this morning.  He is on 2 L oxygen at home.  He is on albuterol and scheduled Flovent.  He has been using his medications.  He denies symptoms of viral URI, fever, nausea, vomiting or diarrhea.  PMH/Problem List: has OBESITY, MORBID; VISUAL IMPAIRMENT; Essential hypertension, benign; RAYNAUD'S SYNDROME; Asthma; ORTHOSTATIC PROTEINURIA; Prader-Willi syndrome; SYMPTOM, ENURESIS, NOCTURNAL; Obesity hypoventilation syndrome (HCC); Allergic rhinitis; Chronic respiratory failure (HCC); Diabetes mellitus type 2 in obese (HCC); Tremor bilateral hands; Diastolic congestive heart failure (HCC); Calculus of gallbladder without cholecystitis without obstruction; and Cellulitis of abdominal wall on their problem list.   has a past medical history of Asthma, Diabetes mellitus without complication (HCC), Hypertension, Kidney dysfunction, Obesity, Obesity hypoventilation syndrome (HCC), Prader-Willi syndrome, Raynaud's phenomenon, and Sleep apnea.  FH:  History reviewed. No pertinent family history.  SH Social History   Tobacco Use  . Smoking status: Never Smoker  . Smokeless tobacco: Never Used  Substance Use Topics  . Alcohol use: No  . Drug use: No    Review of Systems Review of systems negative except for pertinent positives and negatives in history of present illness above.     Objective:    Vitals:   08/28/17 1130  Pulse: (!) 112  Temp: 98.2 F (36.8 C)  TempSrc: Oral  SpO2: (!) 52%   There is no height or weight on file to calculate BMI.  Physical Exam  GEN: Sitting on wheelchair with nasal cannula.  Increased work of breathing.  Appears anxious. Oropharynx:  Chapped lips CVS: Tachycardic to 110, RR, nl s1 & s2, no murmurs,  RESP: Increased work of breathing, diminished air movement bilaterally but limited exam due to body habitus GI: Morbidly obese, limited exam due to body habitus, BS present & normal, soft, NTND SKIN: no apparent skin lesion ENDO: negative thyromegally NEURO: alert and oiented appropriately, no gross deficits  PSYCH: Anxious    Assessment and Plan:  1. Acute on chronic respiratory failure with hypoxia [facility](HCC): Patient with history of asthma, obesity hypoventilation and morbid obesity.  On 2 L oxygen at home.  Oxygen saturation 52% on 2 L on arrival to clinic.  Improved to 92% after DuoNeb and 6 L of oxygen by nasal cannula. Exam with increased work of breathing and diminished air movement bilaterally but limited exam due to body habitus.  Appears anxious.  He is tachycardic to 110.   Per patient's mother and care taker, no symptoms of viral URI, fever or emesis.  He is mentating well.  Called EMS and sent patient to ED for further evaluation and management.  Almon Herculesaye T Conlin Brahm, MD 08/28/17 Pager: [phone]  Precepted patient with Dr. Pollie MeyerMcIntyre.

## 2017-08-29 ENCOUNTER — Inpatient Hospital Stay (HOSPITAL_COMMUNITY): Payer: Medicaid Other

## 2017-08-29 ENCOUNTER — Other Ambulatory Visit: Payer: Self-pay

## 2017-08-29 DIAGNOSIS — R6889 Other general symptoms and signs: Secondary | ICD-10-CM

## 2017-08-29 DIAGNOSIS — R0603 Acute respiratory distress: Secondary | ICD-10-CM

## 2017-08-29 LAB — BLOOD GAS, ARTERIAL
ACID-BASE EXCESS: 9 mmol/L — AB (ref 0.0–2.0)
Acid-Base Excess: 9 mmol/L — ABNORMAL HIGH (ref 0.0–2.0)
Bicarbonate: 35.3 mmol/L — ABNORMAL HIGH (ref 20.0–28.0)
Bicarbonate: 35.8 mmol/L — ABNORMAL HIGH (ref 20.0–28.0)
DELIVERY SYSTEMS: POSITIVE
DELIVERY SYSTEMS: POSITIVE
DRAWN BY: 252031
Drawn by: 10006
EXPIRATORY PAP: 8
Expiratory PAP: 8
FIO2: 50
FIO2: 50
Inspiratory PAP: 18
Inspiratory PAP: 18
Mode: POSITIVE
O2 SAT: 97.4 %
O2 Saturation: 93.6 %
PATIENT TEMPERATURE: 98.6
PCO2 ART: 72 mmHg — AB (ref 32.0–48.0)
PH ART: 7.269 — AB (ref 7.350–7.450)
PO2 ART: 102 mmHg (ref 83.0–108.0)
Patient temperature: 98.6
RATE: 10 resp/min
pCO2 arterial: 80.7 mmHg (ref 32.0–48.0)
pH, Arterial: 7.311 — ABNORMAL LOW (ref 7.350–7.450)
pO2, Arterial: 76 mmHg — ABNORMAL LOW (ref 83.0–108.0)

## 2017-08-29 LAB — COMPREHENSIVE METABOLIC PANEL
ALBUMIN: 2.7 g/dL — AB (ref 3.5–5.0)
ALT: 17 U/L (ref 17–63)
AST: 18 U/L (ref 15–41)
Alkaline Phosphatase: 115 U/L (ref 38–126)
Anion gap: 19 — ABNORMAL HIGH (ref 5–15)
BILIRUBIN TOTAL: 0.8 mg/dL (ref 0.3–1.2)
BUN: 154 mg/dL — ABNORMAL HIGH (ref 6–20)
CHLORIDE: 92 mmol/L — AB (ref 101–111)
CO2: 28 mmol/L (ref 22–32)
Calcium: 8.9 mg/dL (ref 8.9–10.3)
Creatinine, Ser: 3.24 mg/dL — ABNORMAL HIGH (ref 0.61–1.24)
GFR calc Af Amer: 29 mL/min — ABNORMAL LOW (ref >60)
GFR calc non Af Amer: 25 mL/min — ABNORMAL LOW (ref >60)
GLUCOSE: 127 mg/dL — AB (ref 65–99)
POTASSIUM: 5.5 mmol/L — AB (ref 3.5–5.1)
Sodium: 139 mmol/L (ref 135–145)
TOTAL PROTEIN: 8.7 g/dL — AB (ref 6.5–8.1)

## 2017-08-29 LAB — RESPIRATORY PANEL BY PCR
Adenovirus: NOT DETECTED
BORDETELLA PERTUSSIS-RVPCR: NOT DETECTED
CORONAVIRUS 229E-RVPPCR: NOT DETECTED
Chlamydophila pneumoniae: NOT DETECTED
Coronavirus HKU1: NOT DETECTED
Coronavirus NL63: NOT DETECTED
Coronavirus OC43: NOT DETECTED
INFLUENZA B-RVPPCR: NOT DETECTED
Influenza A: NOT DETECTED
MYCOPLASMA PNEUMONIAE-RVPPCR: NOT DETECTED
Metapneumovirus: NOT DETECTED
Parainfluenza Virus 1: NOT DETECTED
Parainfluenza Virus 2: NOT DETECTED
Parainfluenza Virus 3: NOT DETECTED
Parainfluenza Virus 4: NOT DETECTED
RESPIRATORY SYNCYTIAL VIRUS-RVPPCR: NOT DETECTED
Rhinovirus / Enterovirus: NOT DETECTED

## 2017-08-29 LAB — GLUCOSE, CAPILLARY
GLUCOSE-CAPILLARY: 134 mg/dL — AB (ref 65–99)
GLUCOSE-CAPILLARY: 217 mg/dL — AB (ref 65–99)
Glucose-Capillary: 140 mg/dL — ABNORMAL HIGH (ref 65–99)
Glucose-Capillary: 159 mg/dL — ABNORMAL HIGH (ref 65–99)

## 2017-08-29 LAB — TROPONIN I: Troponin I: 0.03 ng/mL (ref <0.03)

## 2017-08-29 LAB — CBC
HEMATOCRIT: 34.2 % — AB (ref 39.0–52.0)
Hemoglobin: 10 g/dL — ABNORMAL LOW (ref 13.0–17.0)
MCH: 21.7 pg — ABNORMAL LOW (ref 26.0–34.0)
MCHC: 29.2 g/dL — AB (ref 30.0–36.0)
MCV: 74.2 fL — AB (ref 78.0–100.0)
Platelets: 229 10*3/uL (ref 150–400)
RBC: 4.61 MIL/uL (ref 4.22–5.81)
RDW: 24.3 % — AB (ref 11.5–15.5)
WBC: 6.2 10*3/uL (ref 4.0–10.5)

## 2017-08-29 LAB — LACTIC ACID, PLASMA
LACTIC ACID, VENOUS: 0.8 mmol/L (ref 0.5–1.9)
Lactic Acid, Venous: 1.2 mmol/L (ref 0.5–1.9)

## 2017-08-29 LAB — PROCALCITONIN: PROCALCITONIN: 1.6 ng/mL

## 2017-08-29 LAB — STREP PNEUMONIAE URINARY ANTIGEN: STREP PNEUMO URINARY ANTIGEN: NEGATIVE

## 2017-08-29 MED ORDER — IPRATROPIUM BROMIDE 0.02 % IN SOLN
0.5000 mg | Freq: Once | RESPIRATORY_TRACT | Status: DC
  Administered 2017-08-28: 0.5 mg via RESPIRATORY_TRACT

## 2017-08-29 MED ORDER — TORSEMIDE 20 MG PO TABS
60.0000 mg | ORAL_TABLET | Freq: Every day | ORAL | Status: DC
  Administered 2017-08-29 – 2017-09-02 (×5): 60 mg via ORAL
  Filled 2017-08-29 (×5): qty 3

## 2017-08-29 MED ORDER — ALBUTEROL SULFATE (2.5 MG/3ML) 0.083% IN NEBU
2.5000 mg | INHALATION_SOLUTION | Freq: Once | RESPIRATORY_TRACT | Status: DC
  Administered 2017-08-28: 2.5 mg via RESPIRATORY_TRACT

## 2017-08-29 MED ORDER — ENOXAPARIN SODIUM 80 MG/0.8ML ~~LOC~~ SOLN
80.0000 mg | SUBCUTANEOUS | Status: DC
  Administered 2017-08-29 – 2017-09-01 (×4): 80 mg via SUBCUTANEOUS
  Filled 2017-08-29 (×4): qty 0.8

## 2017-08-29 NOTE — Progress Notes (Signed)
Transported pt on BIPAP to CT with RN 

## 2017-08-29 NOTE — Addendum Note (Signed)
Addended by: Gilberto BetterSIMPSON, MICHELLE R on: 08/29/2017 10:38 AM   Modules accepted: Orders

## 2017-08-29 NOTE — Care Management Note (Addendum)
Case Management Note  Patient Details  Name: [name] MRN: [id number] Date of Birth: 1995/11/19  Subjective/Objective:    Pt admitted with hypoxia, wheezing and SOB                Action/Plan:  PTA from home with mom.  Pt has hd of Prader- Willi syndrome and is on BIPAP and 4 liters at home.  Pt currently requiring continuous BIPAP   Expected Discharge Date:  09/01/17               Expected Discharge Plan:  Home/Self Care  In-House Referral:     Discharge planning Services  CM Consult  Post Acute Care Choice:    Choice offered to:     DME Arranged:    DME Agency:     HH Arranged:    HH Agency:     Status of Service:     If discussed at [employer] Meetings, dates discussed:    Additional Comments:  Cherylann ParrClaxton, Shaunice Levitan S, RN 08/29/2017, 10:34 AM

## 2017-08-29 NOTE — Progress Notes (Signed)
Called materials and pediatric unit to get a pediatric condom catheter for pt. Was told that the hospital is out of them at this time. Small condom cath too big for pt. Able to use a rectal pouch connected to a urine bag to collect urine for accurate I's and O's per MD order. Pouch is secure and not leaking at this time.

## 2017-08-29 NOTE — Progress Notes (Signed)
PULMONARY / CRITICAL CARE MEDICINE   Name: [name] MRN: [id number] DOB: [birth year]    ADMISSION DATE:  08/28/2017 CONSULTATION DATE:  08/28/2017  REFERRING MD:  Family medicine resident service  CHIEF COMPLAINT:  Wheezing and SOB   HISTORY OF PRESENT ILLNESS:   [name] is a [age] yoM with obesity hyperventilation syndrome presenting with increased wheezing, shortness of breath, and higher than baseline oxygen demand. 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 Kavin LeechByrum's most recent clinic note. As per mother, he was in his usual state of health until this past Thursday 1/17 when he began to develop progressively worsening wheezing. In addition he had fallen asleep which is unusual for him. She stated that in addition he gained ~20lbs of weight but otherwise had been acting normally. She obtained a clinic visit due to the wheezing and weight gain whereupon it was noted that he was hypoxic on digital recording to ~50%. He was sent to the ED from the clinic. Mother attested that a close family contact was recently diagnosed with influenza.   At the ED he was noted to desaturate on two separate occassions whereupon he became very sleepy. He was placed on his BiPAP at his home settings with improvement. ABG 7.209/91.8/79.1/35.3, CBC unremarkable with WBC 10.4 and chronic anemia near baseline 10.3. BNP elevated near previously recorded from four months prior at 211.6. BMP indicated serum Cr 2.92 with Na/K of 137/4.5. This is indicative of an acute worsening in creatinine consistent with acute renal injury. D-dimer was elevated with VQ scan without significant perfusion defect noted.   SUBJECTIVE:  Feeling better this morning. Wants to eat. Very hungry.   VITAL SIGNS: BP 122/76 (BP Location: Right Wrist)   Pulse 98   Temp 98.7 F  (37.1 C) (Oral)   Resp (!) 26   Wt (!) 170.6 kg (376 lb)   SpO2 97%   BMI 79.96 kg/m   HEMODYNAMICS:   VENTILATOR SETTINGS: FiO2 (%):  [40 %-50 %] 40 % INTAKE / OUTPUT: I/O last 3 completed shifts: In: 200 [IV Piggyback:200] Out: 650 [Urine:650]  PHYSICAL EXAMINATION: General: super morbidly obese young male in NAD on BiPAP.  Neuro: Alert, oriented, answering questions as he would at baseline.  HEENT: [place]/AT, PERRL, unable to appreciate JVD.  Cardiovascular: Distant, tachy, regular.  Pulmonary: Clear bilateral breath sounds through anterior lung fields.  Abdomen:  Soft nontender, nondistended  Musculoskeletal:  nonedematous BLE, no evidence of volmue overload, dorsalis pedis pulses appreciable bilaterally. Skin:  Skin is dry, non-diaphoretic.  LABS:  BMET Recent Labs  Lab 08/28/17 1206 08/28/17 1848 08/29/17 0622  NA 137  --  139  K 4.5  --  5.5*  CL 91*  --  92*  CO2 31  --  28  BUN 135*  --  154*  CREATININE 2.92* 3.65* 3.24*  GLUCOSE 222*  --  127*    Electrolytes Recent Labs  Lab 08/28/17 1206 08/29/17 0622  CALCIUM 8.5* 8.9    CBC Recent Labs  Lab 08/28/17 1215 08/28/17 1848 08/29/17 0622  WBC 10.4 10.1 6.2  HGB 10.3* 10.5* 10.0*  HCT 35.9* 37.3* 34.2*  PLT 271 287 229    Coag's No results for input(s): APTT, INR in the last 168 hours.  Sepsis Markers Recent Labs  Lab 08/28/17 1848 08/29/17 0622 08/29/17 0708  LATICACIDVEN  --   --  0.8  PROCALCITON 1.29 1.60  --     ABG Recent Labs  Lab 08/28/17 1555 08/29/17 0001 08/29/17 0641  PHART 7.209* 7.269* 7.311*  PCO2ART 91.8* 80.7* 72.0*  PO2ART 79.1* 76.0* 102    Liver Enzymes Recent Labs  Lab 08/29/17 0622  AST 18  ALT 17  ALKPHOS 115  BILITOT 0.8  ALBUMIN 2.7*    Cardiac Enzymes Recent Labs  Lab 08/28/17 1848 08/28/17 2359 08/29/17 0622  TROPONINI 0.03* 0.03* <0.03    Glucose Recent Labs  Lab 08/28/17 2134 08/28/17 2145 08/29/17 0800  GLUCAP 154* 144*  134*    Imaging Nm Pulmonary Vent And Perf (v/q Scan)  Result Date: 08/28/2017 CLINICAL DATA:  Acute respiratory failure and shortness of Breath EXAM: NUCLEAR MEDICINE VENTILATION - PERFUSION LUNG SCAN TECHNIQUE: Ventilation images were obtained in multiple projections using inhaled aerosol Tc-32m DTPA. Perfusion images were obtained in multiple projections after intravenous injection of Tc-60m MAA. RADIOPHARMACEUTICALS:  Thirty-one mCi of Tc-26m DTPA aerosol inhalation and 4.2 mCi Tc69m MAA-IV COMPARISON:  Chest x-ray from earlier in the same day. FINDINGS: Ventilation: Ventilation images are significantly limited with only two views obtained due to patient limitations. Some overall decrease in ventilation is noted likely related to the diffuse edema seen on recent plain film exam. Perfusion: Limited to two views due to patient limitations. Adequate perfusion is noted throughout both lungs. Cardiac shadow remains enlarged. IMPRESSION: Significantly limited exam although no perfusion defect to suggest pulmonary embolism is noted. Electronically Signed   By: Alcide Clever M.D.   On: 08/28/2017 16:10   Dg Chest Portable 1 View  Result Date: 08/28/2017 CLINICAL DATA:  Two days of shortness of breath. History of chronic respiratory failure, pulmonary hypertension, CHF, and asthma. Also morbid obesity. EXAM: PORTABLE CHEST 1 VIEW COMPARISON:  Portable chest x-ray of April 29, 2017 FINDINGS: The images are limited due to scatter effects secondary to the patient's body habitus. The lungs are well-expanded. There are confluent interstitial and alveolar opacities bilaterally. The cardiac silhouette is enlarged. The pulmonary vascularity is engorged. The trachea is midline. There is no pleural effusion. The observed bony structures are unremarkable. IMPRESSION: CHF with pulmonary interstitial and alveolar edema. One certainly cannot exclude superimposed pneumonia. Electronically Signed   By: David  [place] M.D.    On: 08/28/2017 13:01   STUDIES:  VQ scan-non indicative of perfusion defect, exam not consistent with PE.  DISCUSSION: 21 yoM admitted to the [facility] service for worsening dyspnea and hypoxemia at the outpatient clinic. PCCM was consulted for respiratory status with ABG indicating a pH of 7.209/91.8/79.1/35.3 being indicative for notably acutely worsening chronic respiratory acidosis with metabolic alkalosis. Patient on BiPAP at home setting of 20/8 with FiO2 increased to 50%.   RVP negative Urine strep neg Urine Legionella pending  ASSESSMENT / PLAN:  PULMONARY A: Acute on chronic hypercapnic respiratory failure - placed on BiPAP with home settings. CXR of poor quality given body habitus, unable to sufficiently assess for significant variation from prior exams. Agree that pneumonia vs interstitial edema very possible but unclear.  Possible exacerbation of asthma Subclinical hypothyroid   P:   Titrate O2 down to SpO2 of ~88-95%. Will give a trial off BiPAP. Discussed with family. If tolerates well for a few hours we can try to give him some food. If he cannot tolerate, I think intubation is probably the best next step. CT pending to better characterize airspace. Scheduled and PRN bronchodilators. Steroids for possible asthma exac. Continue tamiflu  and Levaquin. Follow cultures, PCT. Trend fever, WBC curve.  Joneen Radigan, AGACNP-BC [facility] Pulmonology/Critical Care Pager [phone] or [phone]  08/29/2017 8:38 AM

## 2017-08-29 NOTE — Progress Notes (Signed)
Family Medicine Teaching Service Daily Progress Note Intern Pager: [phone]  Patient name: TRAMANE GORUM Medical record number: [id number] Date of birth: [birth year] Age: 22 y.o. Gender: male  Primary Care Provider: Campbell Stall, MD Consultants: CCM Code Status: full  Pt Overview and Major Events to Date:  1/24 admitted to fpts, ccm consulted 1/25 trial off bipap  Assessment and Plan:  Jessie Schrieber Roachis a 21 y.o.malepresenting with increased wheezing, shortness of breath, and increased oxygen requirement. PMH is significant for asthma, pulmonary HTN, Prader-Willi Syndrome, Type 2 diabetes.  Acute hypoxic hypercarbic respiratory failure  OHS: Unclear etiology. Hard to determine if infectious vs cardiac vs copd. CXR essentially unreadable due to body habitus. Elevated d-dimer but vq scan negative. troponins (-) x3. Procalcitonin and LA within normal limits. No evidence of significant volume overload, Echo with EF 55-60% in 02/2017. Prophylactically started on tamiflu and levaquin per CCM recs. Flu negative. Had a sick contact with flu last week. On 2L [place] at baseline. Does have COPD at baseline but hard to say this is an exacerbation. Still on BiPAP. Per CCM recs, plan to trial him off bipap this morning. If able to stay off can wean as tolerated, if worsens may need intubation as it is felt that he cannot maintain WOB forever. - Follow up CCM recs, appreciate their help - albuterol q 2 hours prn, duoneb q 6 hours - continue home flonase, loratidine, montelukast - solumedrol 125mg  daily - will dc lasix, restart home dose of torsemide - wean off bipap as tolerate, replace when tiring - continue levaquin (1/24- ) - follow up blood cultures, urine cultures, legionella, strep pneumo urine antigen - F/U CT chest  AKI: Baseline Cr around 1.0, on admission elevated to 2.92. Creatinine 3.24 down from 3.65. Not showing clinical signs of volume overload. Will stop IV lasix and switch to home  dose of torsemide - daily bmp - Avoid nephrotoxic agents  T2DM: Glucose of222on admission. A1C on7/26/18 was 6.6.On metformin 500 mg XR at home. A1C 7.4 on 1/24. - moderate SSI - qACHS CBGs  - repeat A1C - TSH and FT4  Enuresis: Has nighttime enuresis.  Takes oxybutynin and desmopressin at home - continue home oxybutynin at bedtime  - continue home desmopressin   HTN: BP of152/106on admission.On HCTZ at home. 1/25 bp 139/86. - hold home hydrochlorothiazide given aki - will continue to monitor - will switch to home dose torsemide  Morbid Obesity: Secondary toPrader-Willi syndrome - limit to 1200 kcal a day - Nutrition consult  Resting tremor: Mother reports that this was a problem in the past, but had gotten better.  However she had seen more of the tremor recently.   - will continue to monitor  GERD Chronic. On PPI. Stable. - Protonix 40 mg IV  FEN/GI: NPO while on BiPap, can advance to regular diet when off Prophylaxis: lovenox  Disposition: home   Subjective:  Doing well this morning. More alert per the mother. Complaining of hunger. No complaints  Objective: Temp:  [97.7 F (36.5 C)-98.8 F (37.1 C)] 98.7 F (37.1 C) (01/25 0803) Pulse Rate:  [78-116] 98 (01/25 0811) Resp:  [18-39] 26 (01/25 0811) BP: (118-158)/(72-106) 122/76 (01/25 0803) SpO2:  [52 %-100 %] 97 % (01/25 0811) FiO2 (%):  [40 %-50 %] 40 % (01/25 0811) Weight:  [376 lb (170.6 kg)] 376 lb (170.6 kg) (01/24 1202) Physical Exam: General: morbidly obese, comfortable on BiPAP, non-toxic appearance HEENT: normocephalic, atraumatic, mmm Neck: very thick neck 2/2 obesity,  no lymphadenopathy CV: Difficult to hear heart sounds 2/2 body habitus, no m/r/g appreciated Lungs: very difficult ausculation given body habitus, no chest wall tenderness Lungs: bilaterally wheeze though difficult to appreciate due to large body habitus Abdomen: soft, non-tender, non-distended, normoactive bowel  sounds Skin: warm, dry, several small healing scabs on belly, cap refill < 2 seconds Extremities: warm and well perfused, normal tone, mild pitting edema up to ankles Neuro: A&Ox4, grossly intact throughout, moves all 4 extremities  Laboratory: Recent Labs  Lab 08/28/17 1215 08/28/17 1848 08/29/17 0622  WBC 10.4 10.1 6.2  HGB 10.3* 10.5* 10.0*  HCT 35.9* 37.3* 34.2*  PLT 271 287 229   Recent Labs  Lab 08/28/17 1206 08/28/17 1848 08/29/17 0622  NA 137  --  139  K 4.5  --  5.5*  CL 91*  --  92*  CO2 31  --  28  BUN 135*  --  154*  CREATININE 2.92* 3.65* 3.24*  CALCIUM 8.5*  --  8.9  PROT  --   --  8.7*  BILITOT  --   --  0.8  ALKPHOS  --   --  115  ALT  --   --  17  AST  --   --  18  GLUCOSE 222*  --  127*    Imaging/Diagnostic Tests: CLINICAL DATA:  Acute respiratory failure and shortness of Breath  EXAM: NUCLEAR MEDICINE VENTILATION - PERFUSION LUNG SCAN  TECHNIQUE: Ventilation images were obtained in multiple projections using inhaled aerosol Tc-6101m DTPA. Perfusion images were obtained in multiple projections after intravenous injection of Tc-43101m MAA.  RADIOPHARMACEUTICALS:  Thirty-one mCi of Tc-22101m DTPA aerosol inhalation and 4.2 mCi Tc30101m MAA-IV  COMPARISON:  Chest x-ray from earlier in the same day.  FINDINGS: Ventilation: Ventilation images are significantly limited with only two views obtained due to patient limitations. Some overall decrease in ventilation is noted likely related to the diffuse edema seen on recent plain film exam.  Perfusion: Limited to two views due to patient limitations. Adequate perfusion is noted throughout both lungs. Cardiac shadow remains enlarged.  IMPRESSION: Significantly limited exam although no perfusion defect to suggest pulmonary embolism is noted.  Myrene BuddyFletcher, Tyller Bowlby, MD 08/29/2017, 9:23 AM PGY-1, [place] [facility] FPTS Intern pager: [phone], text pages welcome

## 2017-08-29 NOTE — Progress Notes (Signed)
Pt. Taken off bipap. Was on 6L [place] sats 92%. Pt was able to order a meal and eat lunch. After lunch pt. Became tired and had to switch over to high flow.

## 2017-08-30 DIAGNOSIS — R0603 Acute respiratory distress: Secondary | ICD-10-CM

## 2017-08-30 DIAGNOSIS — N289 Disorder of kidney and ureter, unspecified: Secondary | ICD-10-CM

## 2017-08-30 DIAGNOSIS — I509 Heart failure, unspecified: Secondary | ICD-10-CM

## 2017-08-30 DIAGNOSIS — R6889 Other general symptoms and signs: Secondary | ICD-10-CM

## 2017-08-30 LAB — LEGIONELLA PNEUMOPHILA SEROGP 1 UR AG: L. pneumophila Serogp 1 Ur Ag: NEGATIVE

## 2017-08-30 LAB — CBC WITH DIFFERENTIAL/PLATELET
BASOS PCT: 0 %
Basophils Absolute: 0 10*3/uL (ref 0.0–0.1)
EOS PCT: 0 %
Eosinophils Absolute: 0 10*3/uL (ref 0.0–0.7)
HEMATOCRIT: 35.8 % — AB (ref 39.0–52.0)
HEMOGLOBIN: 10.1 g/dL — AB (ref 13.0–17.0)
LYMPHS PCT: 12 %
Lymphs Abs: 0.5 10*3/uL — ABNORMAL LOW (ref 0.7–4.0)
MCH: 20.7 pg — ABNORMAL LOW (ref 26.0–34.0)
MCHC: 28.2 g/dL — ABNORMAL LOW (ref 30.0–36.0)
MCV: 73.5 fL — AB (ref 78.0–100.0)
MONOS PCT: 5 %
Monocytes Absolute: 0.2 10*3/uL (ref 0.1–1.0)
NEUTROS ABS: 3.5 10*3/uL (ref 1.7–7.7)
Neutrophils Relative %: 83 %
Platelets: 232 10*3/uL (ref 150–400)
RBC: 4.87 MIL/uL (ref 4.22–5.81)
RDW: 23.7 % — AB (ref 11.5–15.5)
WBC: 4.2 10*3/uL (ref 4.0–10.5)

## 2017-08-30 LAB — URINE CULTURE: Culture: NO GROWTH

## 2017-08-30 LAB — BASIC METABOLIC PANEL
ANION GAP: 13 (ref 5–15)
BUN: 158 mg/dL — ABNORMAL HIGH (ref 6–20)
CHLORIDE: 93 mmol/L — AB (ref 101–111)
CO2: 35 mmol/L — AB (ref 22–32)
Calcium: 8.9 mg/dL (ref 8.9–10.3)
Creatinine, Ser: 2.87 mg/dL — ABNORMAL HIGH (ref 0.61–1.24)
GFR calc Af Amer: 34 mL/min — ABNORMAL LOW (ref >60)
GFR calc non Af Amer: 29 mL/min — ABNORMAL LOW (ref >60)
GLUCOSE: 156 mg/dL — AB (ref 65–99)
POTASSIUM: 4.9 mmol/L (ref 3.5–5.1)
Sodium: 141 mmol/L (ref 135–145)

## 2017-08-30 LAB — GLUCOSE, CAPILLARY
GLUCOSE-CAPILLARY: 151 mg/dL — AB (ref 65–99)
Glucose-Capillary: 146 mg/dL — ABNORMAL HIGH (ref 65–99)
Glucose-Capillary: 223 mg/dL — ABNORMAL HIGH (ref 65–99)
Glucose-Capillary: 254 mg/dL — ABNORMAL HIGH (ref 65–99)

## 2017-08-30 LAB — PROCALCITONIN: Procalcitonin: 0.48 ng/mL

## 2017-08-30 MED ORDER — OSELTAMIVIR PHOSPHATE 30 MG PO CAPS
30.0000 mg | ORAL_CAPSULE | Freq: Two times a day (BID) | ORAL | Status: DC
  Administered 2017-08-30 – 2017-09-01 (×5): 30 mg via ORAL
  Filled 2017-08-30 (×5): qty 1

## 2017-08-30 MED ORDER — CALCIUM CARBONATE ANTACID 500 MG PO CHEW
1.0000 | CHEWABLE_TABLET | Freq: Three times a day (TID) | ORAL | Status: DC | PRN
  Administered 2017-08-30 – 2017-09-02 (×2): 200 mg via ORAL
  Filled 2017-08-30 (×2): qty 1

## 2017-08-30 MED ORDER — OSELTAMIVIR PHOSPHATE 75 MG PO CAPS: 75.0000 mg | ORAL_CAPSULE | Freq: Two times a day (BID) | ORAL | Status: DC

## 2017-08-30 NOTE — Progress Notes (Signed)
Droplet precautions initiated per physician request.  Physician is aware that respiratory panel and flu swabs are negative; however, he states family notified him that patient has been around multiple individuals that are flu positive and he wants patient in droplet precautions anyway.  Mom educated re: droplet precautions and requirement for mask in room.  Patient also to be started on Tamiflu.  Mom questions the use of Tamiflu outside of 48 hour window.  Physician will need to address.

## 2017-08-30 NOTE — Progress Notes (Signed)
FPTS Interim Progress Note  Patient off BiPAP with minimal increased work of breathing on 2 L nasal cannula (home dose).  He is able to speak to me in full sentences.  Auscultation difficult given large body habitus though no wheeze appreciated on exam.  Patient has walked around the floor with mom a few times today.  He denies any other complaints at this time.  Mother had questions regarding Tamiflu since influenza was negative on screening.  Discussed benefits outweighing risk with completion of 5-day course per pulmonology recommendations.  Mother reassured without further questions.  Will continue to monitor closely.  Wendee BeaversMcMullen, David J, DO 08/30/2017, 7:50 PM PGY-2, [facility] pager: [phone]

## 2017-08-30 NOTE — Progress Notes (Signed)
Patient not tolerating salter [place], sats continue to drop. RT placed patient on 2L [place] and sats are ranging from 93-98%. Vitals are stable. RT will continue to monitor.

## 2017-08-30 NOTE — Progress Notes (Signed)
Patient receives full bath from family member by choice; transferred up to bedside recliner.  Patient is on HFNC and sats low to mid 90s.  Tolerated well.  Anticipate walking shortly.

## 2017-08-30 NOTE — Progress Notes (Signed)
Call Pager [phone] for any questions or notifications regarding this patient  FMTS Attending Admission Note: Joshua LevySara Shahzain Kiester MD Attending pager:[phone] I  have seen and examined this patient, reviewed their chart. I have discussed this patient with the resident. I agree with the resident's findings, assessment and care plan.  Please see their note for details. He is symptomatically improving.  We will try to get him out of bed today.  He was ambulatory at home.  He is still not back to his baseline per mom's report.  Continue antibiotics.  His urine output is good..  Unclear whether or not the recorded output for the first day was accurate as they had some difficulty getting appropriate size catheter.  Acute kidney injury improved.

## 2017-08-30 NOTE — Progress Notes (Signed)
[facility] PCCM  S: feels better  O:  Vitals:   08/30/17 0754 08/30/17 0812 08/30/17 1119 08/30/17 1155  BP:  (!) 149/93 138/86   Pulse:  100 (!) 110 (!) 117  Resp:  (!) 35 (!) 22 19  Temp:  98.5 F (36.9 C) 98.6 F (37 C)   TempSrc:  Oral Oral   SpO2: 99% 95% 100% 93%  Weight:       5L   Gen: well appearing HENT: OP clear, TM's clear, neck supple PULM: Wheezing noted bilaterally B, normal percussion CV: RRR, no mgr, trace edema GI: BS+, soft, nontender Derm: no cyanosis or rash Psyche: normal mood and affect  CT chest images reviewed: patchy bilateral infiltrates, bilateral atelectasis  resp viral panel negative  Impression/plan:  Acute on chronic respiratory failure with hypercarbia and hypoxemia: I really think he has the flu based on multiple contacts and the appearance of his CXR.  I realize his respiratory viral panel is negative.  However I think the best approach is to treat with both tamiflu for 5 full days and levaquin empirically for CAP.   > wean off O2 > continue nightly BIPAP > droplet precautions  PCCM available PRN  Heber CarolinaBrent McQuaid, MD [place] P[facility]M Pager: [phone] Cell: [phone] After 3pm or if no response, call [phone]

## 2017-08-30 NOTE — Progress Notes (Signed)
Family Medicine Teaching Service Daily Progress Note Intern Pager: [phone]  Patient name: [name] M Rob[place]am Medical record number: [id number] Date of birth: Jun 09, 1996 Age: 22 y.o. Gender: male  Primary Care Provider: Mayo, Allyn KennerKaty Dodd, MD Consultants: CCM Code Status: full  Pt Overview and Major Events to Date:  1/24 admitted to fpts, ccm consulted 1/25 trial off bipap 1/26 off bipap for daytime, improved WOB  Assessment and Plan:  [name] 21 y.o.malepresenting with increased wheezing, shortness of breath, and increased oxygen requirement. PMH is significant for asthma, pulmonary HTN, Prader-Willi Syndrome, Type 2 diabetes.  Hypoxic hypercarbic respiratory failure  OHS: Acute.  Improved.  RVP, flu, UCx, Legionella, and Strep pneumo cultures negative.  Chest CT supports pneumonia.  Currently treating for community-acquired pneumonia and flu given recent contacts.  PCCM following and recommending such.  Continues to remain on BiPAP in the evening but able to tolerate wean in the daytime.  Increased work of breathing has improved.  Does not appear to be secondary to fluid overload.  Restarted home diuretics.  PE unlikely given unremarkable VQ scan on admission. - PCCM consulted, appreciate recommendations - Continue Levaquin renal dose, day 3 of 7 - Continue treatment dose Tamiflu, day 1 of 5, droplet precautions - Continue Solu-Medrol, day 3 of 5 - Trial off BiPAP with wean as tolerated in the daytime, BiPAP in the evening (baseline) - Continue home torsemide 60 mg daily - DuoNeb every 6 hours scheduled, albuterol nebulizer every 2 hours as needed - Tylenol as needed for pain or fever  AKI: Improving.  Creatinine down 2.87 from initial 3.65 (baseline 1.0).  Suspect this is secondary to prerenal.  Improving despite restarting home torsemide. - Monitor creatinine daily - Avoid nephrotoxic agents  Non-insulin-dependent 2 diabetes mellitus: Chronic.  Suboptimal control with A1c  7.4 on admission.  Currently on metformin at home.  CBGs remain in the mid 100s and low 200s. - Holding home metformin XR 500 mg  - CBG monitoring QACHS - Moderate sliding scale insulin  Subclinical hypothyroidism: Acute.  Does have history of subacute hypothyroidism in the past.  Given acute illness will need to follow-up outpatient. - Monitor outpatient  Enuresis: Chronic.  Takes oxybutynin and desmopressin at home. -Continue home oxybutynin 10 mg daily and desmopressin 0.4 mg daily  Hypertension: Chronic.  Stable.  BP 138/86.  Currently on HCTZ at home. - Continue home HCTZ 25 mg daily  Resting tremor: Chronic.  Stable. -Continue to monitor outpatient  GERD: Chronic.  Stable.  On PPI. - Continue Protonix 40 mg IV  FEN/GI:  Regular diet while off BiPAP Prophylaxis:  Lovenox SQ renally dose  Disposition: Home pending improvement of presumed CAP  Subjective:  Patient doing well this morning.  States he is not quite at baseline and still has some shortness of breath but says he feels much better.  He is interested in getting up and walking around later today and is asking for Mckinney meal.  Denies chest pain, nausea or vomiting, abdominal pain.  Continues to have some coughing but improved.  Objective: Temp:  [97.7 F (36.5 C)-99.2 F (37.3 C)] 98.6 F (37 C) (01/26 1119) Pulse Rate:  [90-122] 121 (01/26 1417) Resp:  [19-35] 23 (01/26 1417) BP: (117-149)/(82-93) 138/86 (01/26 1119) SpO2:  [89 %-100 %] 94 % (01/26 1417) FiO2 (%):  [28 %-55 %] 28 % (01/26 1417) Weight:  [374 lb 5.5 oz (169.8 kg)] 374 lb 5.5 oz (169.8 kg) (01/26 0556) Physical Exam: General: morbidly obese,  NAD with non-toxic appearance HEENT: normocephalic, atraumatic, moist mucous membranes Neck: supple, non-tender without lymphadenopathy, thick neck secondary to morbid obesity Cardiovascular: regular rate and rhythm without murmurs, rubs, or gallops Lungs: coarse breath sounds bilaterally with normal work of  breathing on room air Abdomen: soft, non-tender, non-distended, normoactive bowel sounds Skin: warm, dry, no rashes or lesions, cap refill < 2 seconds Extremities: warm and well perfused, normal tone, no edema Neuro: Mckinney&Ox3, grossly intact throughout  Laboratory: Recent Labs  Lab 08/28/17 1848 08/29/17 0622 08/30/17 0704  WBC 10.1 6.2 4.2  HGB 10.5* 10.0* 10.1*  HCT 37.3* 34.2* 35.8*  PLT 287 229 232   Recent Labs  Lab 08/28/17 1206 08/28/17 1848 08/29/17 0622 08/30/17 0704  NA 137  --  139 141  K 4.5  --  5.5* 4.9  CL 91*  --  92* 93*  CO2 31  --  28 35*  BUN 135*  --  154* 158*  CREATININE 2.92* 3.65* 3.24* 2.87*  CALCIUM 8.5*  --  8.9 8.9  PROT  --   --  8.7*  --   BILITOT  --   --  0.8  --   ALKPHOS  --   --  115  --   ALT  --   --  17  --   AST  --   --  18  --   GLUCOSE 222*  --  127* 156*    Imaging/Diagnostic Tests: CLINICAL DATA:  Acute respiratory failure and shortness of Breath  EXAM: NUCLEAR MEDICINE VENTILATION - PERFUSION LUNG SCAN  TECHNIQUE: Ventilation images were obtained in multiple projections using inhaled aerosol Tc-6m DTPA. Perfusion images were obtained in multiple projections after intravenous injection of Tc-74m MAA.  RADIOPHARMACEUTICALS:  Thirty-one mCi of Tc-41m DTPA aerosol inhalation and 4.2 mCi Tc45m MAA-IV  COMPARISON:  Chest x-ray from earlier in the same day.  FINDINGS: Ventilation: Ventilation images are significantly limited with only two views obtained due to patient limitations. Some overall decrease in ventilation is noted likely related to the diffuse edema seen on recent plain film exam.  Perfusion: Limited to two views due to patient limitations. Adequate perfusion is noted throughout both lungs. Cardiac shadow remains enlarged.  IMPRESSION: Significantly limited exam although no perfusion defect to suggest pulmonary embolism is noted.  Wendee Beavers, DO 08/30/2017, 2:25 PM PGY-2, [place]  Family [facility] FPTS Intern pager: [phone], text pages welcome

## 2017-08-31 LAB — CBC
HCT: 35.2 % — ABNORMAL LOW (ref 39.0–52.0)
HEMOGLOBIN: 10.2 g/dL — AB (ref 13.0–17.0)
MCH: 21.6 pg — AB (ref 26.0–34.0)
MCHC: 29 g/dL — ABNORMAL LOW (ref 30.0–36.0)
MCV: 74.6 fL — ABNORMAL LOW (ref 78.0–100.0)
PLATELETS: 221 10*3/uL (ref 150–400)
RBC: 4.72 MIL/uL (ref 4.22–5.81)
RDW: 24.3 % — ABNORMAL HIGH (ref 11.5–15.5)
WBC: 4.7 10*3/uL (ref 4.0–10.5)

## 2017-08-31 LAB — URINALYSIS, ROUTINE W REFLEX MICROSCOPIC
Bacteria, UA: NONE SEEN
Bilirubin Urine: NEGATIVE
Glucose, UA: NEGATIVE mg/dL
Ketones, ur: NEGATIVE mg/dL
NITRITE: NEGATIVE
PH: 6 (ref 5.0–8.0)
Protein, ur: 30 mg/dL — AB
Specific Gravity, Urine: 1.009 (ref 1.005–1.030)
Squamous Epithelial / LPF: NONE SEEN

## 2017-08-31 LAB — BASIC METABOLIC PANEL
Anion gap: 15 (ref 5–15)
BUN: 162 mg/dL — AB (ref 6–20)
CHLORIDE: 93 mmol/L — AB (ref 101–111)
CO2: 33 mmol/L — ABNORMAL HIGH (ref 22–32)
Calcium: 9 mg/dL (ref 8.9–10.3)
Creatinine, Ser: 2.51 mg/dL — ABNORMAL HIGH (ref 0.61–1.24)
GFR, EST AFRICAN AMERICAN: 40 mL/min — AB (ref >60)
GFR, EST NON AFRICAN AMERICAN: 34 mL/min — AB (ref >60)
Glucose, Bld: 194 mg/dL — ABNORMAL HIGH (ref 65–99)
POTASSIUM: 5 mmol/L (ref 3.5–5.1)
SODIUM: 141 mmol/L (ref 135–145)

## 2017-08-31 LAB — GLUCOSE, CAPILLARY
GLUCOSE-CAPILLARY: 147 mg/dL — AB (ref 65–99)
GLUCOSE-CAPILLARY: 115 mg/dL — AB (ref 65–99)
GLUCOSE-CAPILLARY: 150 mg/dL — AB (ref 65–99)
Glucose-Capillary: 118 mg/dL — ABNORMAL HIGH (ref 65–99)

## 2017-08-31 MED ORDER — DEXTROSE 5 % IV SOLN
1.0000 g | INTRAVENOUS | Status: DC
  Administered 2017-08-31: 1 g via INTRAVENOUS
  Filled 2017-08-31 (×2): qty 10

## 2017-08-31 NOTE — Progress Notes (Signed)
Patient's mother spoke with home health company to obtain home settings on patient's trilogy. Patient is on IPAP of 20 and EPAP of 15 with RR set at 10. RT will relay this information to night shift RT to ensure we are matching settings here that patient is used to at home. Vitals are stable. RT will continue to monitor.

## 2017-08-31 NOTE — Progress Notes (Signed)
FPTS Interim Progress Note  BP (!) 153/107   Pulse (!) 101   Temp 98.8 F (37.1 C) (Oral)   Resp 18   Wt (!) 374 lb 5.5 oz (169.8 kg)   SpO2 100%   BMI 79.60 kg/m    Called to bedside to evaluate patient for rapid response. Mother and family members at bedside concerned that [name] has been acting strange this morning. He keeps repeating the same phrases about not having any clothes on despite having a gown on and about not being able to see. Additionally, he urinated on the middle of the floor which is apparently not typical. Patient is responsive to questions but this provider does note that he repeats several phrases throughout the exam. Heart RRR. Lungs with wheezing bilaterally but good air movement. O2 saturations appropriate on 2L by [place]. PERRL. Patient able to identify some fingers correctly in visual field but unsure what his mental status would allow at baseline. He does correctly identify all family members at bedside. RT present to draw ABG. Will check UA given incontinence but low suspicion for infection given recent negative urine culture and being treated for CAP with Levaquin. Patient without any falls during hospitalization so very low suspicion for intracranial process. Discussed medications with pharmacy. There is a small risk that Levaquin could be causing anxiety/agitation/confusion. Will switch to CTX 1g daily per pharmacy recommendations. Patient has also been receiving Solumedrol 125 mg daily for 3 days. Steroid induced confusion is possible. May considering decreasing dose if respiratory status remains stable. Will continue to monitor closely.   Arvilla MarketWallace, Catherine Lauren, DO 08/31/2017, 10:12 AM PGY-3, [facility] pager [phone]

## 2017-08-31 NOTE — Significant Event (Signed)
Rapid Response Event Note  Overview:  Called by RN  Time Called: [phone] Arrival Time: 0940 Event Type: Other (Comment)  Initial Focused Assessment:  Called by RN at request of family.  Family is upset because patient is acting differently.  Patient keeps repeating same questions and states he can't see.  HR 107, RR26, 153/107 on nasal cannula.   Family requesting to speak to MD.    Interventions:  MD at bedside.  RT called to bedside for ABG.    Plan of Care (if not transferred):  RN to monitor and follow through MD orders Call RRT if need assistance  Event Summary:     at       at          [facility], Maryagnes Amosenise Ann

## 2017-08-31 NOTE — Progress Notes (Signed)
Family Medicine Teaching Service Daily Progress Note Intern Pager: [phone]  Patient name: [name] Medical record number: [id number] Date of birth: 05/19/1996 Age: 22 y.o. Gender: male  Primary Care Provider: Mayo, Allyn Kenner, MD Consultants: CCM Code Status: full  Pt Overview and Major Events to Date:  1/24 admitted to fpts, ccm consulted 1/25 trial off bipap 1/26 off bipap for daytime, improved WOB  Assessment and Plan:  [name] a 21 y.o.malepresenting with increased wheezing, shortness of breath, and increased oxygen requirement. PMH is significant for asthma, pulmonary HTN, Prader-Willi Syndrome, Type 2 diabetes.  Hypoxic hypercarbic respiratory failure  OHS: Acute.  Improved.  Chest CT concerning for pneumonia.  Recent flu contact.  Treating with Levaquin and Tamiflu.  PCCM following in agreement.  Able to wean O2 to home dose.  - PCCM consulted, appreciate recommendations - Continue Levaquin renal dose, day 4 of 7 - Continue treatment dose Tamiflu, day 2 of 5, droplet precautions - Continue Solu-Medrol, day 4 of 5 - Continue home O2 2 L [place] and BiPAP in the evening - Continue home torsemide 60 mg daily - DuoNeb every 6 hours scheduled, albuterol nebulizer every 2 hours as needed - Tylenol as needed for pain or fever  AKI: Improving.  Creatinine down 2.51 from initial 3.65 (baseline 1.0).  Suspect this is secondary to prerenal.  Improving despite restarting home torsemide. - Monitor creatinine daily - Avoid nephrotoxic agents  Non-insulin-dependent 2 diabetes mellitus: Chronic.  Suboptimal control with A1c 7.4 on admission.  Currently on metformin at home.  CBGs remain in the mid 100s and low 200s. - Holding home metformin XR 500 mg  - CBG monitoring QACHS - Moderate sliding scale insulin  Subclinical hypothyroidism: Acute.  Does have history of subacute hypothyroidism in the past.  Given acute illness will need to follow-up outpatient. - Monitor  outpatient  Enuresis: Chronic.  Takes oxybutynin and desmopressin at home. -Continue home oxybutynin 10 mg daily and desmopressin 0.4 mg daily  Hypertension: Chronic.  Stable.  BP 128/85.  Currently on HCTZ at home. - Continue home HCTZ 25 mg daily  Resting tremor: Chronic.  Stable. -Continue to monitor outpatient  GERD: Chronic.  Stable.  On PPI. - Continue Protonix 40 mg IV  FEN/GI:  Regular diet while off BiPAP Prophylaxis:  Lovenox SQ renally dose  Disposition: Continue treatment for CAP and monitor while weaning oxygen requirement.  Anticipate discharge home likely 09/01/17.  Subjective:  Patient states he is doing well this morning.  Shortness of breath is improved from yesterday.  He has been able to walk around the hospital for yesterday.  No complaints this morning.  Objective: Temp:  [98.1 F (36.7 C)-98.8 F (37.1 C)] 98.1 F (36.7 C) (01/27 0524) Pulse Rate:  [80-121] 90 (01/27 0700) Resp:  [15-35] 16 (01/27 0600) BP: (128-149)/(85-93) 128/85 (01/27 0524) SpO2:  [89 %-100 %] 98 % (01/27 0700) FiO2 (%):  [28 %-50 %] 50 % (01/27 0300) Physical Exam: General: morbidly obese resting comfortably, NAD with non-toxic appearance HEENT: normocephalic, atraumatic, moist mucous membranes Cardiovascular: regular rate and rhythm without murmurs, rubs, or gallops Lungs: clear to auscultation bilaterally with normal work of breathing on BiPAP Abdomen: soft, non-tender, non-distended, normoactive bowel sounds Skin: warm, dry, no rashes or lesions, cap refill < 2 seconds Extremities: warm and well perfused, normal tone, no edema  Laboratory: Recent Labs  Lab 08/29/17 0622 08/30/17 0704 08/31/17 0357  WBC 6.2 4.2 4.7  HGB 10.0* 10.1* 10.2*  HCT 34.2*  35.8* 35.2*  PLT 229 232 221   Recent Labs  Lab 08/29/17 0622 08/30/17 0704 08/31/17 0357  NA 139 141 141  K 5.5* 4.9 5.0  CL 92* 93* 93*  CO2 28 35* 33*  BUN 154* 158* 162*  CREATININE 3.24* 2.87* 2.51*  CALCIUM  8.9 8.9 9.0  PROT 8.7*  --   --   BILITOT 0.8  --   --   ALKPHOS 115  --   --   ALT 17  --   --   AST 18  --   --   GLUCOSE 127* 156* 194*    Imaging/Diagnostic Tests: CLINICAL DATA:  Acute respiratory failure and shortness of Breath  EXAM: NUCLEAR MEDICINE VENTILATION - PERFUSION LUNG SCAN  TECHNIQUE: Ventilation images were obtained in multiple projections using inhaled aerosol Tc-7982m DTPA. Perfusion images were obtained in multiple projections after intravenous injection of Tc-7182m MAA.  RADIOPHARMACEUTICALS:  Thirty-one mCi of Tc-7782m DTPA aerosol inhalation and 4.2 mCi Tc1482m MAA-IV  COMPARISON:  Chest x-ray from earlier in the same day.  FINDINGS: Ventilation: Ventilation images are significantly limited with only two views obtained due to patient limitations. Some overall decrease in ventilation is noted likely related to the diffuse edema seen on recent plain film exam.  Perfusion: Limited to two views due to patient limitations. Adequate perfusion is noted throughout both lungs. Cardiac shadow remains enlarged.  IMPRESSION: Significantly limited exam although no perfusion defect to suggest pulmonary embolism is noted.  Wendee BeaversMcMullen, Virginia Curl J, DO 08/31/2017, 8:09 AM PGY-2, [place] [facility] FPTS Intern pager: [phone], text pages welcome

## 2017-08-31 NOTE — Plan of Care (Signed)
Patient continues to improve gradually; tolerates BiPAP when needed.  Patient activity level was decreased today; however, remains able to get in/out of bed, assists w/turns in bed.  Patient is incontinent of urine; rectal pouch re-applied to peri-area as external catheter set up to maintain skin integrity and patient dignity.  Will continue to monitor for improved respiratory status and increased activity tolerance.

## 2017-08-31 NOTE — Progress Notes (Signed)
RT called MD Dr. Earlene PlaterWallace concerning blood gas results. RT unable to obtain arterial blood gas but was able to run venous blood. CO2 is currently 80.3 and pH is 7.334. MD advised to hold off on bipap currently as pH is compensated and patient's baseline CO2 tends to run higher than normal. Vitals are stable. RT will continue to monitor.

## 2017-08-31 NOTE — Progress Notes (Signed)
Ipap changed to 20 and EPAP changed to 15 per patients home regimen.

## 2017-08-31 NOTE — Progress Notes (Signed)
This note also relates to the following rows which could not be included: Resp - Cannot attach notes to unvalidated device data  Patient just ambulated to bedside commode. Patient is very tired and wants to rest and take a nap. Patient placed on bipap QHS as per order. Vitals are stable and sats are 100%. RT will continue to monitor.

## 2017-09-01 DIAGNOSIS — Q871 Congenital malformation syndromes predominantly associated with short stature: Secondary | ICD-10-CM

## 2017-09-01 LAB — BLOOD GAS, VENOUS
Acid-Base Excess: 15.1 mmol/L — ABNORMAL HIGH (ref 0.0–2.0)
Bicarbonate: 41.6 mmol/L — ABNORMAL HIGH (ref 20.0–28.0)
Drawn by: 51185
O2 CONTENT: 2 L/min
O2 Saturation: 36 %
PATIENT TEMPERATURE: 98.6
PH VEN: 7.334 (ref 7.250–7.430)
pCO2, Ven: 80.3 mmHg (ref 44.0–60.0)

## 2017-09-01 LAB — BASIC METABOLIC PANEL
ANION GAP: 15 (ref 5–15)
BUN: 152 mg/dL — ABNORMAL HIGH (ref 6–20)
CALCIUM: 8.8 mg/dL — AB (ref 8.9–10.3)
CO2: 34 mmol/L — ABNORMAL HIGH (ref 22–32)
CREATININE: 2.09 mg/dL — AB (ref 0.61–1.24)
Chloride: 94 mmol/L — ABNORMAL LOW (ref 101–111)
GFR calc Af Amer: 50 mL/min — ABNORMAL LOW (ref >60)
GFR, EST NON AFRICAN AMERICAN: 43 mL/min — AB (ref >60)
GLUCOSE: 128 mg/dL — AB (ref 65–99)
Potassium: 5.6 mmol/L — ABNORMAL HIGH (ref 3.5–5.1)
Sodium: 143 mmol/L (ref 135–145)

## 2017-09-01 LAB — GLUCOSE, CAPILLARY
Glucose-Capillary: 114 mg/dL — ABNORMAL HIGH (ref 65–99)
Glucose-Capillary: 152 mg/dL — ABNORMAL HIGH (ref 65–99)

## 2017-09-01 MED ORDER — PANTOPRAZOLE SODIUM 40 MG PO TBEC
40.0000 mg | DELAYED_RELEASE_TABLET | Freq: Every day | ORAL | Status: DC
  Administered 2017-09-02: 40 mg via ORAL
  Filled 2017-09-01: qty 1

## 2017-09-01 MED ORDER — OSELTAMIVIR PHOSPHATE 75 MG PO CAPS
75.0000 mg | ORAL_CAPSULE | Freq: Two times a day (BID) | ORAL | Status: DC
  Administered 2017-09-01 – 2017-09-02 (×2): 75 mg via ORAL
  Filled 2017-09-01 (×3): qty 1

## 2017-09-01 MED ORDER — DEXTROSE 5 % IV SOLN
2.0000 g | INTRAVENOUS | Status: DC
  Administered 2017-09-01 – 2017-09-02 (×2): 2 g via INTRAVENOUS
  Filled 2017-09-01 (×2): qty 2

## 2017-09-01 NOTE — Progress Notes (Signed)
Patient resting comfortably on BIPAP at this time. RT will monitor as needed.

## 2017-09-01 NOTE — Plan of Care (Signed)
Patient continues to progress toward overall goals of care.  Patient up to chair and ambulated several times throughout the day.  Back on BiPAP at night.  VSS.  Continue to monitor.

## 2017-09-01 NOTE — Progress Notes (Signed)
Family Medicine Teaching Service Daily Progress Note Intern Pager: [phone]  Patient name: [name] Medical record number: [id number] Date of birth: November 11, 1995 Age: 22 y.o. Gender: male  Primary Care Provider: Mayo, Allyn KennerKaty Dodd, MD Consultants: CCM Code Status: full  Pt Overview and Major Events to Date:  1/24 admitted to fpts, ccm consulted 1/25 trial off bipap 1/26 off bipap for daytime, improved WOB  Assessment and Plan:  Joshua Circleazhae M Roachis a 21 y.o.malepresenting with increased wheezing, shortness of breath, and increased oxygen requirement. PMH is significant for asthma, pulmonary HTN, Prader-Willi Syndrome, Type 2 diabetes.  Hypoxic hypercarbic respiratory failure  OHS: Acute.  Much improved.  Chest CT concerning for pneumonia.  Recent flu contact.  Treating with Ceftriaxone and Tamiflu. S/p 4 days Levaquin, d/c use due to mental status changes yesterday, tolerating well.  PCCM following in agreement.  Able to wean O2 to home dose (2L). On BiPAP at night at home. May likely be able to discharge tomorrow after finishing antibiotic course. - PCCM consulted, appreciate recommendations - Continue Ceftriaxone, will finish antibiotic course tomorrow - Continue treatment dose Tamiflu, will finish tomorrow - S/p 4 days Solu-Medrol - Continue home O2 2 L [place] and BiPAP in the evening - Continue home torsemide 60 mg daily - DuoNeb every 6 hours scheduled, albuterol nebulizer every 2 hours as needed - Tylenol as needed for pain or fever - ambulate with pulse ox  AKI: Improving.  Creatinine down 2.09 from initial 3.65 (baseline 1.0).  Suspect this is secondary to prerenal.  Improving despite restarting home torsemide. - Monitor creatinine daily - Avoid nephrotoxic agents  Non-insulin-dependent 2 diabetes mellitus: Chronic.  Suboptimal control with A1c 7.4 on admission.  Currently on metformin at home.  CBGs remain in the mid 100s and low 200s. - Holding home metformin XR 500 mg  -  CBG monitoring QACHS - Moderate sliding scale insulin  Subclinical hypothyroidism: Acute.  Does have history of subacute hypothyroidism in the past.  Given acute illness will need to follow-up outpatient. - Monitor outpatient  Enuresis: Chronic.  Takes oxybutynin and desmopressin at home. - Holding home oxybutynin 10 mg daily and desmopressin 0.4 mg   Hypertension: Chronic.  Stable.  BP 144/96.  Currently on HCTZ at home. - Holding home HCTZ 25 mg   Resting tremor: Chronic.  Stable. -Continue to monitor outpatient  GERD: Chronic.  Stable.  On PPI. - transition Protonix 40 mg IV to PO  FEN/GI:  Regular diet while off BiPAP Prophylaxis:  Lovenox SQ renally dose  Disposition: Continue treatment for CAP and monitor while weaning oxygen requirement.  Anticipate discharge home likely 09/02/17.  Subjective:  Patient feeling well this morning. Ambulated from bed to chair with assistance. Breathing improved.  Objective: Temp:  [97.6 F (36.4 C)-98.9 F (37.2 C)] 98.1 F (36.7 C) (01/28 1701) Pulse Rate:  [89-115] 107 (01/28 1701) Resp:  [17-25] 22 (01/28 1701) BP: (125-144)/(87-96) 144/96 (01/28 1701) SpO2:  [71 %-100 %] 98 % (01/28 1701) FiO2 (%):  [35 %] 35 % (01/28 0122) Physical Exam: General: morbidly obese sitting in chair, resting comfortably, NAD with non-toxic appearance HEENT: normocephalic, atraumatic, moist mucous membranes Cardiovascular: regular rate and rhythm without murmurs, rubs, or gallops Lungs: clear to auscultation bilaterally with normal work of breathing on 2L O2 [place] Abdomen: soft, non-tender, non-distended, normoactive bowel sounds Skin: warm, dry, no rashes or lesions, cap refill < 2 seconds Extremities: warm and well perfused, normal tone, no edema  Laboratory: Recent Labs  Lab  08/29/17 0622 08/30/17 0704 08/31/17 0357  WBC 6.2 4.2 4.7  HGB 10.0* 10.1* 10.2*  HCT 34.2* 35.8* 35.2*  PLT 229 232 221   Recent Labs  Lab 08/29/17 0622  08/30/17 0704 08/31/17 0357 09/01/17 0136  NA 139 141 141 143  K 5.5* 4.9 5.0 5.6*  CL 92* 93* 93* 94*  CO2 28 35* 33* 34*  BUN 154* 158* 162* 152*  CREATININE 3.24* 2.87* 2.51* 2.09*  CALCIUM 8.9 8.9 9.0 8.8*  PROT 8.7*  --   --   --   BILITOT 0.8  --   --   --   ALKPHOS 115  --   --   --   ALT 17  --   --   --   AST 18  --   --   --   GLUCOSE 127* 156* 194* 128*    Imaging/Diagnostic Tests: CLINICAL DATA:  Acute respiratory failure and shortness of Breath  EXAM: NUCLEAR MEDICINE VENTILATION - PERFUSION LUNG SCAN  TECHNIQUE: Ventilation images were obtained in multiple projections using inhaled aerosol Tc-89m DTPA. Perfusion images were obtained in multiple projections after intravenous injection of Tc-51m MAA.  RADIOPHARMACEUTICALS:  Thirty-one mCi of Tc-16m DTPA aerosol inhalation and 4.2 mCi Tc59m MAA-IV  COMPARISON:  Chest x-ray from earlier in the same day.  FINDINGS: Ventilation: Ventilation images are significantly limited with only two views obtained due to patient limitations. Some overall decrease in ventilation is noted likely related to the diffuse edema seen on recent plain film exam.  Perfusion: Limited to two views due to patient limitations. Adequate perfusion is noted throughout both lungs. Cardiac shadow remains enlarged.  IMPRESSION: Significantly limited exam although no perfusion defect to suggest pulmonary embolism is noted.  Ellwood Dense, DO 09/01/2017, 5:42 PM PGY-1, [facility] Family Medicine FPTS Intern pager: [phone], text pages welcome

## 2017-09-02 DIAGNOSIS — R0602 Shortness of breath: Secondary | ICD-10-CM

## 2017-09-02 DIAGNOSIS — I509 Heart failure, unspecified: Secondary | ICD-10-CM

## 2017-09-02 DIAGNOSIS — N289 Disorder of kidney and ureter, unspecified: Secondary | ICD-10-CM

## 2017-09-02 LAB — GLUCOSE, CAPILLARY
GLUCOSE-CAPILLARY: 147 mg/dL — AB (ref 65–99)
Glucose-Capillary: 152 mg/dL — ABNORMAL HIGH (ref 65–99)
Glucose-Capillary: 148 mg/dL — ABNORMAL HIGH (ref 65–99)
Glucose-Capillary: 114 mg/dL — ABNORMAL HIGH (ref 65–99)

## 2017-09-02 NOTE — Progress Notes (Signed)
Discharge note. Patient and patient's mother educated at bedside. RN educated on follow up appointments, diet and activity recommendations, medications and when to take them, and instructions with home oxygen.  PIV removed without complications.

## 2017-09-02 NOTE — Care Management Note (Addendum)
Case Management Note  Patient Details  Name: [name] MRN: [id number] Date of Birth: 1995-08-17  Subjective/Objective:    Pt admitted with hypoxia, wheezing and SOB                Action/Plan:  PTA from home with mom.  Pt has hd of Prader- Willi syndrome and is on BIPAP and 4 liters at home.  Pt currently requiring continuous BIPAP   Expected Discharge Date:  09/02/17               Expected Discharge Plan:  Home/Self Care  In-House Referral:     Discharge planning Services  CM Consult  Post Acute Care Choice:    Choice offered to:     DME Arranged:    DME Agency:     HH Arranged:    HH Agency:     Status of Service:     If discussed at [employer] Meetings, dates discussed:    Additional Comments: 09/02/2017  Pt has PCP and mom denied barriers with obtaining/paying for medications.  CM informed by mom that pt now has trilogy (supp[facility])  in the home at night only - prior to that he used BIPAP at night .  Mom requested increase in bleed in LPM with Trilogy;  current order from Pulmonologist is  specified as 4 liters - however mom request for bleed in with Triology to be increased to  5 liters while pt is healing from current infection.  Attending agrees with increase and will write orders.  [facility] contacted and confirmed they can provide adequate equipment in the home for increase oxygen need today (pt has discharge order). [facility] informed that the Trilogy equipment should be ordered as BIPAP and specify Trilogy in the comments.  Pt will discharge home on baseline home liter flow for continuous oxygen to be used during the day ([facility] Mom informed CM that she has a portable tank for transport home.  Pt will transport home via private vehicle  Cherylann ParrClaxton, Alister Staver S, [place]RN 09/02/2017, 2:07 PM

## 2017-09-02 NOTE — Progress Notes (Signed)
Family Medicine Teaching Service Daily Progress Note Intern Pager: [phone]  Patient name: [name] Medical record number: [id number] Date of birth: 29-Aug-1995 Age: 22 y.o. Gender: male  Primary Care Provider: Mayo, Allyn Kenner, MD Consultants: CCM Code Status: full  Pt Overview and Major Events to Date:  1/24 admitted to fpts, ccm consulted 1/25 trial off bipap 1/26 off bipap for daytime, improved WOB  Assessment and Plan:  [name] a 21 y.o.malepresenting with increased wheezing, shortness of breath, and increased oxygen requirement. PMH is significant for asthma, pulmonary HTN, Prader-Willi Syndrome, Type 2 diabetes.  Hypoxic hypercarbic respiratory failure  OHS:  Treating as a pneumonia and is now on day 5 of antibiotics. Has received CTX and levaquin since admission (1/24- ). Is back to baseline from a respiratory standpoint. On 2L in day time, satting well. On BiPAP at night. Can likely discharge later on 1/29 after abx course is completed later in day. - To finish CTX dose this am, likely early afternoon dc - Continue treatment dose Tamiflu, will finish in afternoon - S/p 4 days Solu-Medrol - Continue home O2 2 L [place] and BiPAP in the evening - Continue home torsemide 60 mg daily - DuoNeb every 6 hours scheduled, albuterol nebulizer every 2 hours as needed - Tylenol as needed for pain or fever - ambulate with pulse ox  AKI: Creatinine down 2.09 from initial 3.65 (baseline 1.0).  Suspect this is secondary to prerenal.  Improving despite restarting home torsemide. - Monitor creatinine daily - Avoid nephrotoxic agents  Non-insulin-dependent 2 diabetes mellitus: Chronic.  Suboptimal control with A1c 7.4 on admission.  Currently on metformin at home.  CBGs remain in the low-mid 100s. - Holding home metformin XR 500 mg  - CBG monitoring QACHS - Moderate sliding scale insulin  Subclinical hypothyroidism: Does have history of subacute hypothyroidism in the past.   Given acute illness will need to follow-up outpatient. - Monitor outpatient  Enuresis: Chronic.  Takes oxybutynin and desmopressin at home. - Holding home oxybutynin 10 mg daily and desmopressin 0.4 mg   Hypertension: Chronic.  Stable.  BP 128/99.  Currently on HCTZ at home. - Holding home HCTZ 25 mg   Resting tremor: Chronic.  Stable. -Continue to monitor outpatient  GERD: Chronic.  Stable.  On PPI. - continue Protonix 40 mg IV to PO  FEN/GI:  Regular diet while off BiPAP Prophylaxis:  Lovenox SQ renally dose  Disposition: home 1/29  Subjective:  Feeling well this morning. Able to get up in bed. Pleasant with no complaints.  Objective: Temp:  [97.6 F (36.4 C)-98.9 F (37.2 C)] 98.3 F (36.8 C) (01/29 0410) Pulse Rate:  [95-108] 101 (01/29 0410) Resp:  [17-23] 23 (01/29 0410) BP: (126-144)/(82-99) 128/99 (01/29 0410) SpO2:  [87 %-100 %] 100 % (01/29 0410) FiO2 (%):  [35 %] 35 % (01/29 0311) Weight:  [365 lb 4.8 oz (165.7 kg)] 365 lb 4.8 oz (165.7 kg) (01/29 0436) Physical Exam: General: morbidly obese sitting in chair, resting comfortably, NAD with non-toxic appearance HEENT: normocephalic, atraumatic, moist mucous membranes Cardiovascular: rrr, no m/r/g Lungs: CTAB, normal WOB on 2L O2 [place] Abdomen: soft, non-tender, non-distended, normoactive bowel sounds Skin: warm, dry, no rashes or lesions, cap refill < 2 seconds Extremities: warm and well perfused, normal tone, no edema  Laboratory: Recent Labs  Lab 08/29/17 0622 08/30/17 0704 08/31/17 0357  WBC 6.2 4.2 4.7  HGB 10.0* 10.1* 10.2*  HCT 34.2* 35.8* 35.2*  PLT 229 232 221  Recent Labs  Lab 08/29/17 0622 08/30/17 0704 08/31/17 0357 09/01/17 0136  NA 139 141 141 143  K 5.5* 4.9 5.0 5.6*  CL 92* 93* 93* 94*  CO2 28 35* 33* 34*  BUN 154* 158* 162* 152*  CREATININE 3.24* 2.87* 2.51* 2.09*  CALCIUM 8.9 8.9 9.0 8.8*  PROT 8.7*  --   --   --   BILITOT 0.8  --   --   --   ALKPHOS 115  --   --   --    ALT 17  --   --   --   AST 18  --   --   --   GLUCOSE 127* 156* 194* 128*    Imaging/Diagnostic Tests: CLINICAL DATA:  Acute respiratory failure and shortness of Breath  EXAM: NUCLEAR MEDICINE VENTILATION - PERFUSION LUNG SCAN  TECHNIQUE: Ventilation images were obtained in multiple projections using inhaled aerosol Tc-1217m DTPA. Perfusion images were obtained in multiple projections after intravenous injection of Tc-1017m MAA.  RADIOPHARMACEUTICALS:  Thirty-one mCi of Tc-6117m DTPA aerosol inhalation and 4.2 mCi Tc9517m MAA-IV  COMPARISON:  Chest x-ray from earlier in the same day.  FINDINGS: Ventilation: Ventilation images are significantly limited with only two views obtained due to patient limitations. Some overall decrease in ventilation is noted likely related to the diffuse edema seen on recent plain film exam.  Perfusion: Limited to two views due to patient limitations. Adequate perfusion is noted throughout both lungs. Cardiac shadow remains enlarged.  IMPRESSION: Significantly limited exam although no perfusion defect to suggest pulmonary embolism is noted.  Myrene BuddyFletcher, Cumi Sanagustin, MD 09/02/2017, 7:02 AM PGY-1, [facility] FPTS Intern pager: [phone], text pages welcome

## 2017-09-02 NOTE — Discharge Instructions (Signed)
You were admitted with a presumed pneumonia which caused you to not breath as well. You rapidly improved on antibiotics and were back to normal prior to discharge. You completed the course of antibiotics so you were not sent home with any. A follow up appointment was scheduled on 2/11 at 8:30AM with Dr. Sampson GoonFitzgerald for a hospital follow up. Please keep this appointment.

## 2017-09-02 NOTE — Progress Notes (Signed)
Pt taken off bipap at this time. Per mother's request, pt on Room Air at this time to see how he will do without O2. RT will closely monitor pt for need to go on [place]

## 2017-09-03 ENCOUNTER — Telehealth: Payer: Self-pay | Admitting: Internal Medicine

## 2017-09-03 NOTE — Telephone Encounter (Signed)
Spoke with Arrington's mother about CPAP settings. She is concerned and wants his CPAP settings back to what they were set at originally. [facility] apparently needs a physician's order to change the settings. As I am unsure of [facility] or what kind of order they would require, I have asked her to reach out and request the [facility] or for the RT to call for verbal order.  Marcy Sirenatherine Wallace, D.O. 09/03/2017, 4:22 PM PGY-3, [facility]

## 2017-09-03 NOTE — Telephone Encounter (Signed)
Will forward to inpatient team.  [facility]

## 2017-09-03 NOTE — Telephone Encounter (Signed)
Mom called about settings.  [facility] is at the home changing Ipap to 20 and EPap to 8.  Previously Epap was 15. Mom was not aware these changes.  Apparently the orders were sent from Dr Leveda AnnaHensel.  Please advise

## 2017-09-03 NOTE — Discharge Summary (Signed)
[facility] Discharge Summary  Patient name: [name] Medical record number: [id number] Date of birth: Aug 18, 1995 Age: 22 y.o. Gender: male Date of Admission: 08/28/2017  Date of Discharge: 09/02/2017 Admitting Physician: Zenia Resides, MD  Primary Care Provider: Sela Hua, MD Consultants: CCM  Indication for Hospitalization: Hypoxic respiratory failure   Discharge Diagnoses/Problem List:  Hypoxic Hypercarbic Respiratory Failure Obesity Hypoventilation syndrome Acute Kidney Injury T2DM Subclincal hypothyroidism  Disposition: home  Discharge Condition: stable  Discharge Exam: General: morbidly obese sitting in chair, resting comfortably, NAD with non-toxic appearance HEENT: normocephalic, atraumatic, moist mucous membranes Cardiovascular: rrr, no m/r/g Lungs: CTAB, normal WOB on 2L O2 [place] Abdomen: soft, non-tender, non-distended, normoactive bowel sounds Skin: warm, dry, no rashes or lesions, cap refill < 2 seconds Extremities: warm and well perfused, normal tone, no edema  Brief Hospital Course:  22 year old who presented on 08/28/2017 2/2 increased shortness of breath that began on 1/22 and worsened until 1/24 prompting presentation to hospital. Was found to have aki on admission, with presenting cr of 2.92 (baseline 1.0). He had an elevated d-dimer to 5 but VQ scan was negative in the ED. He was found to have a ct scan and chest xray consistent with pneumonia.  Respiratory failure/pneumonia Due to his oxygen demand and BiPAP use PCCM was consulted on 1/24. He was started on levaquin and tamiflu and recommendations were made to wean him off bipap was tolerated. Patient did have wheezing noted on exam, and he also received breathing treatments and prednisone while admitted. He gradually improved over the course of his admission to where he was back on 2L (home O2 requirement) and was only using BiPAP at night. On 1/27 he developed acute  confusion. It was felt that this confusion was either due to the Levaquin or prednisone. His prednisone wad discontinues and his antibiotic was switched to ceftriaxone at that time. He continued to symptomatically improve until he finished his antibiotic course on 1/29. He wad discharged home on his home 2L of oxygen and BiPAP at night. Of note the patient did receive one dose of lasix early in his admission but this was not felt to have improved his respiratory status at all. He only received one dose.  AKI/Hyperkalemia Initial cr 2.92. It was felt that this was due to a combination of dehydration during his illness and likely hypertension (present on admission). He was not given fluid hydration in setting of possible fluid overload. His creatinine did improve to 2.09 during admission. It was felt that this would continue to improve. He did have slight hyperkalemia (5.6) on 1/28. It was thought that his combination of thiazide diuretic and torsemide would likely bring this back to normal range after discharge.  Issues for Follow Up:  1. Follow up respiratory status 2. Consider rechecking k and creatinine in clinic  Significant Procedures: none  Significant Labs and Imaging:  Recent Labs  Lab 08/29/17 0622 08/30/17 0704 08/31/17 0357  WBC 6.2 4.2 4.7  HGB 10.0* 10.1* 10.2*  HCT 34.2* 35.8* 35.2*  PLT 229 232 221   Recent Labs  Lab 08/28/17 1206 08/28/17 1848 08/29/17 0622 08/30/17 0704 08/31/17 0357 09/01/17 0136  NA 137  --  139 141 141 143  K 4.5  --  5.5* 4.9 5.0 5.6*  CL 91*  --  92* 93* 93* 94*  CO2 31  --  28 35* 33* 34*  GLUCOSE 222*  --  127* 156* 194* 128*  BUN 135*  --  154* 158* 162* 152*  CREATININE 2.92* 3.65* 3.24* 2.87* 2.51* 2.09*  CALCIUM 8.5*  --  8.9 8.9 9.0 8.8*  ALKPHOS  --   --  115  --   --   --   AST  --   --  18  --   --   --   ALT  --   --  17  --   --   --   ALBUMIN  --   --  2.7*  --   --   --     Results/Tests Pending at Time of  Discharge:  Discharge Medications:  Allergies as of 09/02/2017   No Known Allergies     Medication List    STOP taking these medications   cephALEXin 500 MG capsule Commonly known as:  KEFLEX     TAKE these medications   ACCU-CHEK AVIVA PLUS test strip Generic drug:  glucose blood as directed   AEROCHAMBER MV inhaler Use with albuterol inhaler   calcium carbonate 500 MG chewable tablet Commonly known as:  TUMS - dosed in mg elemental calcium Chew 2 tablets (400 mg of elemental calcium total) by mouth 3 (three) times daily between meals as needed for indigestion or heartburn.   cetirizine 10 MG tablet Commonly known as:  ZYRTEC Take 1 tablet (10 mg total) by mouth daily.   desmopressin 0.2 MG tablet Commonly known as:  DDAVP Take 0.4 mg by mouth at bedtime.   fluticasone 110 MCG/ACT inhaler Commonly known as:  FLOVENT HFA Inhale 2 puffs into the lungs 2 (two) times daily.   fluticasone 50 MCG/ACT nasal spray Commonly known as:  FLONASE instill 2 sprays into each nostril once daily   hydrochlorothiazide 25 MG tablet Commonly known as:  HYDRODIURIL Take 1 tablet (25 mg total) by mouth daily.   ibuprofen 200 MG tablet Commonly known as:  ADVIL,MOTRIN Take 600-800 mg by mouth every 6 (six) hours as needed for mild pain.   Lancets 30G Misc 1 Device by Does not apply route daily before breakfast.   ACCU-CHEK SOFTCLIX LANCETS lancets Use as instructed   metFORMIN 500 MG 24 hr tablet Commonly known as:  GLUCOPHAGE-XR take 1 tablet by mouth once daily WITH BREAKFAST   Misc. Devices Misc Please provide a pulse oximeter for the ear lobe. Patient has diagnosis of [place], asthma   montelukast 10 MG tablet Commonly known as:  SINGULAIR Take 1 tablet (10 mg total) by mouth at bedtime.   Nebulizer/Adult Mask Kit 1 Device by Does not apply route once.   Respiratory Therapy Supplies Misc BiPAP IPAP:20, EPAP: 8, back up respiratory rate: 10, FIO2: 35%.    olopatadine 0.1 % ophthalmic solution Commonly known as:  PATANOL Place 1 drop into both eyes 2 (two) times daily as needed for allergies.   oxybutynin 5 MG tablet Commonly known as:  DITROPAN Take 2 tablets (10 mg total) by mouth at bedtime.   pantoprazole 20 MG tablet Commonly known as:  PROTONIX Take 1 tablet (20 mg total) by mouth daily.   PREVIDENT 5000 ENAMEL PROTECT 1.1-5 % Pste Generic drug:  Sod Fluoride-Potassium Nitrate Take 1 application by mouth 2 (two) times daily.   PROAIR HFA 108 (90 Base) MCG/ACT inhaler Generic drug:  albuterol inhale 2 puff by mouth every 6 hours if needed   albuterol (2.5 MG/3ML) 0.083% nebulizer solution Commonly known as:  PROVENTIL inhale contents of 1 vial in nebulizer once daily   torsemide  20 MG tablet Commonly known as:  DEMADEX Take 3 tablets (60 mg total) by mouth daily.       Discharge Instructions: Please refer to Patient Instructions section of EMR for full details.  Patient was counseled important signs and symptoms that should prompt return to medical care, changes in medications, dietary instructions, activity restrictions, and follow up appointments.   Follow-Up Appointments: Follow-up Information    Rogue Bussing, MD Follow up on 09/15/2017.   Specialty:  Family Medicine Why:  Please arrive for your 8:30 appointment by 8:15 Contact information: [place] [address] [phone]           Guadalupe Dawn, MD 09/03/2017, 10:13 PM PGY-1, [place]

## 2017-09-05 ENCOUNTER — Encounter: Payer: Self-pay | Admitting: Internal Medicine

## 2017-09-05 NOTE — Telephone Encounter (Signed)
Letter placed in fax pile [facility] Thanks!

## 2017-09-05 NOTE — Telephone Encounter (Signed)
Patient's mother left message on nurse line regarding settings on Trilogy machine. I spoke with mother and she states that [facility] when need [facility] over an order to change settings on machine from 12 and 8 back to 12 and 15. The order must come from here for it to be changed. F[facility] number to [facility] [phone]. Ples SpecterAlisa Kalen Neidert, RN [facility] RN)

## 2017-09-08 NOTE — Telephone Encounter (Signed)
[name]. [facility] and it was faxed on 09/05/17. I refaxed it and told her it will be coming. I also read the letter to her. She will call back if it is not received. Ples SpecterAlisa Wilmary Levit, RN [facility] RN)

## 2017-09-09 ENCOUNTER — Telehealth: Payer: Self-pay | Admitting: Emergency Medicine

## 2017-09-09 NOTE — Telephone Encounter (Signed)
Form signed and faxed back to [place] Park[place].  Nothing further needed.

## 2017-09-09 NOTE — Telephone Encounter (Signed)
Spoke with [employer]. Advised him that we did not receive the fax. He is currently faxing it to both fax machines.   Form has been located in RB's cubby. Left form on Lindsay's desk for RB to sign.

## 2017-09-15 ENCOUNTER — Encounter: Payer: Self-pay | Admitting: Internal Medicine

## 2017-09-15 ENCOUNTER — Ambulatory Visit (INDEPENDENT_AMBULATORY_CARE_PROVIDER_SITE_OTHER): Payer: Medicaid Other | Admitting: Internal Medicine

## 2017-09-15 ENCOUNTER — Other Ambulatory Visit: Payer: Self-pay

## 2017-09-15 VITALS — BP 102/78 | HR 95 | Temp 98.8°F | Ht 59.0 in | Wt 358.2 lb

## 2017-09-15 DIAGNOSIS — J9622 Acute and chronic respiratory failure with hypercapnia: Secondary | ICD-10-CM | POA: Diagnosis not present

## 2017-09-15 DIAGNOSIS — N179 Acute kidney failure, unspecified: Secondary | ICD-10-CM

## 2017-09-15 DIAGNOSIS — J9621 Acute and chronic respiratory failure with hypoxia: Secondary | ICD-10-CM | POA: Diagnosis not present

## 2017-09-15 DIAGNOSIS — J9611 Chronic respiratory failure with hypoxia: Secondary | ICD-10-CM | POA: Diagnosis not present

## 2017-09-15 DIAGNOSIS — J9612 Chronic respiratory failure with hypercapnia: Secondary | ICD-10-CM

## 2017-09-15 NOTE — Patient Instructions (Addendum)
Thank you for bringing in Alecsander,  Please call Dr. Kavin LeechByrum's office at [phone] for an appointment at your earliest convenience. Please confirm with them that he has had the pneumococcal 23 vaccine.  I will get back to you about his lab results and whether there is a different type of oxygen saturation monitor he can use.   Best, Dr. Sampson GoonFitzgerald

## 2017-09-15 NOTE — Progress Notes (Signed)
[facility] Progress Note  Subjective:  [name] is a 22 y.o. male with history of Prader-Willi syndrome, T2DM, subclinical hypothyroidism, and obesity hypoventilation syndrome on chronic supplemental O2 who was admitted for hypoxic respiratory failure in setting of pneumonia from 1/24-1/29/19. He completed a course of ceftriaxone after switching from levaquin and prednisone due to some new confusion. He presents for follow-up.   He is accompanied by his mother who says he has been doing well since leaving the hospital. He is down to his regular 2L of supplemental O2. He is eating and drinking normally and back on his monitored diet. Mom has kept him out of his daycare program as recommended to avoid exposure to anyone who might be sick as he recovers but has had him start to increase physical activity again. She denies that he has had fevers. She requests a different type of pulse oximeter for [name] because he has Raynaud's and the regular monitor does not provide consistent readings. She says she has been instructed to try to wean [name]'s supplemental oxygen as able by his pulmonologist Dr. Delton CoombesByrum with goal >/=88% but cannot do so without accurate readings.  ROS: No n/v/d, no diaphoresis  No Known Allergies  Social History   Tobacco Use  . Smoking status: Never Smoker  . Smokeless tobacco: Never Used  Substance Use Topics  . Alcohol use: No    Objective: Blood pressure 102/78, pulse 95, temperature 98.8 F (37.1 C), temperature source Oral, height 4\' 11"  (1.499 m), weight (!) 358 lb 3.2 oz (162.5 kg), SpO2 92 %. Constitutional: Morbidly obese male, pleasant HENT: Dry mucous membranes, no nasal congestion present Cardiovascular: RRR, S1, S2, no m/r/g.  Pulmonary/Chest: Effort normal and no crackles or wheezes of upper posterior lung fields but decreased breath sounds at bases.  Abdominal: Soft. +BS, NT Musculoskeletal: No LE edema Neurological: Alert and  interactive Skin: Trace edema of lower abdomen panniculus with slight hyperpigmentation but no increased warmth or erythema (had recent treatment for cellulitis) Vitals reviewed  Assessment/Plan: Chronic respiratory failure (HCC) - Stable after recent hospitalization for pneumonia. - Recommended follow-up with Dr. Delton CoombesByrum - [facility] asking for different type of pulse oximeter to assist more accurate readings - Will obtain BMP to follow-up AKI and mild hyperkalemia from hospitalization - Recommended mother asks about pneumoccocal 23 vaccination history at pulmonology visit (mother thinks [name] has had [facility])   Follow-up prn.  Dani GobbleHillary Rilee Wendling, MD [facility], PGY-3

## 2017-09-16 ENCOUNTER — Encounter: Payer: Self-pay | Admitting: Internal Medicine

## 2017-09-16 ENCOUNTER — Telehealth: Payer: Self-pay

## 2017-09-16 LAB — BASIC METABOLIC PANEL
BUN / CREAT RATIO: 50 — AB (ref 9–20)
BUN: 76 mg/dL — AB (ref 6–20)
CHLORIDE: 90 mmol/L — AB (ref 96–106)
CO2: 36 mmol/L — ABNORMAL HIGH (ref 20–29)
Calcium: 9.3 mg/dL (ref 8.7–10.2)
Creatinine, Ser: 1.53 mg/dL — ABNORMAL HIGH (ref 0.76–1.27)
GFR calc Af Amer: 73 mL/min/{1.73_m2} (ref >59)
GFR calc non Af Amer: 63 mL/min/{1.73_m2} (ref >59)
GLUCOSE: 186 mg/dL — AB (ref 65–99)
Potassium: 3.9 mmol/L (ref 3.5–5.2)
Sodium: 143 mmol/L (ref 134–144)

## 2017-09-16 NOTE — Telephone Encounter (Signed)
Shanda BumpsJessica with [facility] left message on nurse line asking to speak with [facility]. Callback is [phone]. Ples SpecterAlisa Bryler Dibble, RN [facility] RN)

## 2017-09-16 NOTE — Assessment & Plan Note (Addendum)
-   Stable after recent hospitalization for pneumonia. - Recommended follow-up with Dr. Delton CoombesByrum - [facility] different type of pulse oximeter to assist more accurate readings - Will obtain BMP to follow-up AKI and mild hyperkalemia from hospitalization - Recommended mother asks about pneumoccocal 23 vaccination history at pulmonology visit (mother thinks [name] has had this though [facility] is [facility])

## 2017-09-17 NOTE — Telephone Encounter (Signed)
Spoke with Shanda BumpsJessica of [facility]. She will order some neonate pulse oximetry probes for patient to try.

## 2017-09-17 NOTE — Telephone Encounter (Signed)
[name] asking to speak with Dr Sampson GoonFitzgerald again. Ples SpecterAlisa Brake, RN [facility] RN)

## 2017-09-19 NOTE — Telephone Encounter (Signed)
Spoke with Shanda BumpsJessica. She says probes were sent and no questions or need for further orders currently.

## 2017-09-22 ENCOUNTER — Other Ambulatory Visit: Payer: Self-pay | Admitting: Internal Medicine

## 2017-09-23 ENCOUNTER — Other Ambulatory Visit: Payer: Self-pay | Admitting: *Deleted

## 2017-09-23 MED ORDER — TORSEMIDE 20 MG PO TABS: 60.0000 mg | ORAL_TABLET | Freq: Every day | ORAL | 5 refills | Status: DC

## 2017-09-30 ENCOUNTER — Encounter: Payer: Self-pay | Admitting: Adult Health

## 2017-09-30 ENCOUNTER — Telehealth: Payer: Self-pay

## 2017-09-30 ENCOUNTER — Ambulatory Visit (INDEPENDENT_AMBULATORY_CARE_PROVIDER_SITE_OTHER)
Admission: RE | Admit: 2017-09-30 | Discharge: 2017-09-30 | Disposition: A | Discharge status: Home / Self Care | Payer: Medicaid Other | Source: Ambulatory Visit | Attending: Adult Health | Admitting: Adult Health

## 2017-09-30 ENCOUNTER — Ambulatory Visit: Payer: Medicaid Other | Admitting: Adult Health

## 2017-09-30 DIAGNOSIS — J9612 Chronic respiratory failure with hypercapnia: Secondary | ICD-10-CM

## 2017-09-30 DIAGNOSIS — J9611 Chronic respiratory failure with hypoxia: Secondary | ICD-10-CM

## 2017-09-30 DIAGNOSIS — J45909 Unspecified asthma, uncomplicated: Secondary | ICD-10-CM | POA: Diagnosis not present

## 2017-09-30 DIAGNOSIS — J189 Pneumonia, unspecified organism: Secondary | ICD-10-CM | POA: Diagnosis not present

## 2017-09-30 NOTE — Progress Notes (Signed)
_0  ID: [name], male    DOB: Nov 02, 1995, 22 y.o.   MRN: [id number]  Chief Complaint  Patient presents with  . Follow-up    OHS     Referring provider: Mayo, Pete Pelt, MD  HPI: 22 year old never smoker with a history of Prader-Willi syndrome complicated by severe obesity and OSA/OHS on Trilogy vent  /O2 At bedtime  . Has Asthma   09/30/2017 Follow up : OSA/OHS , Asthma  Patient presents for a post hospital follow-up.  Patient was admitted last month for acute on chronic hypoxic and hypercarbic respiratory failure with PNA . Marland Kitchen  Hospital records were reviewed.  Patient had possible pneumonia was treated with Levaquin and Tamiflu.  Flu was neg. He did require initial BiPAP support.  Patient did have some acute kidney injury on admission with creatinine at 2.92. Improved at discharge with creatinine at 2.09.  Since discharge patient is feeling he is feeling much better.  Breathing is improving.  He wants to get back to the [facility] and water exercises.  He remains on oxygen at 2 L.  Wants to get a more portable system so he can be more active O2 saturations at rest on room air 87%.  Patient required 2 L of gin walking to keep O2 saturations greater than 90%.  Recently changed from BIPAP to Trilogy vent At bedtime  In last month.  Feels that this is helping him he is more alert in the mornings has more energy and is less sleepy. Patient is accompanied by his mother and caregiver.  Denies chest pain orthopnea PND or increased leg swelling   No Known Allergies  Immunization History  Administered Date(s) Administered  . Hepatitis A 10/16/2006  . Influenza Split 07/02/2011, 04/20/2012  . Influenza,inj,Quad PF,6+ Mos 06/30/2013, 05/12/2017  . Td 10/16/2006  . Tdap 01/08/2016    Past Medical History:  Diagnosis Date  . Asthma   . Diabetes mellitus without complication ([place])   . Hypertension    Pulmomary htn  . Kidney dysfunction    left  . Obesity   . Obesity  hypoventilation syndrome ([place])   . Prader-Willi syndrome   . Raynaud's phenomenon   . Sleep apnea     Tobacco History: Social History   Tobacco Use  Smoking Status Never Smoker  Smokeless Tobacco Never Used   Counseling given: Not Answered   Outpatient Encounter Medications as of 09/30/2017  Medication Sig  . ACCU-CHEK AVIVA PLUS test strip as directed  . ACCU-CHEK SOFTCLIX LANCETS lancets Use as instructed  . albuterol (PROVENTIL) (2.5 MG/3ML) 0.083% nebulizer solution inhale contents of 1 vial in nebulizer once daily  . calcium carbonate (TUMS - DOSED IN MG ELEMENTAL CALCIUM) 500 MG chewable tablet Chew 2 tablets (400 mg of elemental calcium total) by mouth 3 (three) times daily between meals as needed for indigestion or heartburn.  . cetirizine (ZYRTEC) 10 MG tablet take 1 tablet by mouth once daily  . desmopressin (DDAVP) 0.2 MG tablet Take 0.4 mg by mouth at bedtime.   . fluticasone (FLONASE) 50 MCG/ACT nasal spray instill 2 sprays into each nostril once daily  . fluticasone (FLOVENT HFA) 110 MCG/ACT inhaler Inhale 2 puffs into the lungs 2 (two) times daily.  . hydrochlorothiazide (HYDRODIURIL) 25 MG tablet Take 1 tablet (25 mg total) by mouth daily.  . Lancets 30G MISC 1 Device by Does not apply route daily before breakfast.  . metFORMIN (GLUCOPHAGE-XR) 500 MG 24 hr tablet take 1 tablet by mouth  once daily WITH BREAKFAST  . Misc. Devices MISC Please provide a pulse oximeter for the ear lobe. Patient has diagnosis of Raynaud, asthma  . montelukast (SINGULAIR) 10 MG tablet Take 1 tablet (10 mg total) by mouth at bedtime.  Marland Kitchen olopatadine (PATANOL) 0.1 % ophthalmic solution Place 1 drop into both eyes 2 (two) times daily as needed for allergies.  Marland Kitchen oxybutynin (DITROPAN) 5 MG tablet Take 2 tablets (10 mg total) by mouth at bedtime.  . pantoprazole (PROTONIX) 20 MG tablet Take 1 tablet (20 mg total) by mouth daily.  Marland Kitchen PREVIDENT 5000 ENAMEL PROTECT 1.1-5 % PSTE Take 1 application by  mouth 2 (two) times daily.   Marland Kitchen PROAIR HFA 108 (90 Base) MCG/ACT inhaler inhale 2 puff by mouth every 6 hours if needed  . Respiratory Therapy Supplies (NEBULIZER/ADULT MASK) KIT 1 Device by Does not apply route once.  Marland Kitchen Respiratory Therapy Supplies MISC BiPAP IPAP:20, EPAP: 8, back up respiratory rate: 10, FIO2: 35%.  Marland Kitchen Spacer/Aero-Holding Chambers (AEROCHAMBER MV) inhaler Use with albuterol inhaler  . torsemide (DEMADEX) 20 MG tablet Take 3 tablets (60 mg total) by mouth daily.  . [DISCONTINUED] ibuprofen (ADVIL,MOTRIN) 200 MG tablet Take 600-800 mg by mouth every 6 (six) hours as needed for mild pain.   No facility-administered encounter medications on file as of 09/30/2017.      Review of Systems  Constitutional:   No  weight loss, night sweats,  Fevers, chills,  +fatigue, or  lassitude.  HEENT:   No headaches,  Difficulty swallowing,  Tooth/dental problems, or  Sore throat,                No sneezing, itching, ear ache, nasal congestion, post nasal drip,   CV:  No chest pain,  Orthopnea, PND, swelling in lower extremities, anasarca, dizziness, palpitations, syncope.   GI  No heartburn, indigestion, abdominal pain, nausea, vomiting, diarrhea, change in bowel habits, loss of appetite, bloody stools.   Resp:   No chest wall deformity  Skin: no rash or lesions.  GU: no dysuria, change in color of urine, no urgency or frequency.  No flank pain, no hematuria   MS:  No joint pain or swelling.  No decreased range of motion.  No back pain.    Physical Exam  BP 128/68 (BP Location: Right Arm, Cuff Size: Normal)   Pulse 98   Ht 4' 9.75" (1.467 m)   Wt (!) 349 lb (158.3 kg)   SpO2 93%   BMI 73.57 kg/m   GEN: A/Ox3; pleasant , NAD, morbidly obese on O2 .    HEENT:  /AT,  EACs-clear, TMs-wnl, NOSE-clear, THROAT-clear, no lesions, no postnasal drip or exudate noted. Class 3 MP airway   NECK:  Supple w/ fair ROM; no JVD; normal carotid impulses w/o bruits; no thyromegaly or  nodules palpated; no lymphadenopathy.    RESP  Clear  P & A; w/o, wheezes/ rales/ or rhonchi. no accessory muscle use, no dullness to percussion  CARD:  RRR, no m/r/g, tr  peripheral edema, pulses intact, no cyanosis or clubbing.  GI:   Soft & nt; nml bowel sounds; no organomegaly or masses detected.   Musco: Warm bil, no deformities or joint swelling noted.   Neuro: alert, no focal deficits noted.    Skin: Warm, no lesions or rashes    Lab Results:  CBC    Component Value Date/Time   WBC 4.7 08/31/2017 0357   RBC 4.72 08/31/2017 0357   HGB 10.2 (L)  08/31/2017 0357   HCT 35.2 (L) 08/31/2017 0357   PLT 221 08/31/2017 0357   MCV 74.6 (L) 08/31/2017 0357   MCH 21.6 (L) 08/31/2017 0357   MCHC 29.0 (L) 08/31/2017 0357   RDW 24.3 (H) 08/31/2017 0357   LYMPHSABS 0.5 (L) 08/30/2017 0704   MONOABS 0.2 08/30/2017 0704   EOSABS 0.0 08/30/2017 0704   BASOSABS 0.0 08/30/2017 0704    BMET    Component Value Date/Time   NA 143 09/15/2017 0921   K 3.9 09/15/2017 0921   CL 90 (L) 09/15/2017 0921   CO2 36 (H) 09/15/2017 0921   GLUCOSE 186 (H) 09/15/2017 0921   GLUCOSE 128 (H) 09/01/2017 0136   BUN 76 (HH) 09/15/2017 0921   CREATININE 1.53 (H) 09/15/2017 0921   CREATININE 1.00 03/06/2016 1242   CALCIUM 9.3 09/15/2017 0921   GFRNONAA 63 09/15/2017 0921   GFRNONAA >89 03/08/2015 1559   GFRAA 73 09/15/2017 0921   GFRAA >89 03/08/2015 1559    BNP    Component Value Date/Time   BNP 211.6 (H) 08/28/2017 1207    ProBNP    Component Value Date/Time   PROBNP 32.0 10/06/2010 1237    Imaging: No results found.   Assessment & Plan:   Chronic respiratory failure (HCC) Continue on oxygen Order for portable oxygen concentrator sent to the DME  Plan  Patient Instructions  Chest x-ray today Continue on trilogy vent at bedtime with oxygen Order for portable oxygen concentrator Continue on oxygen 2 L Continue on Flovent twice daily.   May use pro-air as needed  wheezing Follow-up in 6-8 weeks with Dr. Lamonte Sakai and as needed Please contact office for sooner follow up if symptoms do not improve or worsen or seek emergency care       Asthma Controlled on current regimen   Plan  Patient Instructions  Chest x-ray today Continue on trilogy vent at bedtime with oxygen Order for portable oxygen concentrator Continue on oxygen 2 L Continue on Flovent twice daily.   May use pro-air as needed wheezing Follow-up in 6-8 weeks with Dr. Lamonte Sakai and as needed Please contact office for sooner follow up if symptoms do not improve or worsen or seek emergency care       Pneumonia Clinically improved on abx   Plan  Patient Instructions  Chest x-ray today Continue on trilogy vent at bedtime with oxygen Order for portable oxygen concentrator Continue on oxygen 2 L Continue on Flovent twice daily.   May use pro-air as needed wheezing Follow-up in 6-8 weeks with Dr. Lamonte Sakai and as needed Please contact office for sooner follow up if symptoms do not improve or worsen or seek emergency care          Rexene Edison, NP 09/30/2017

## 2017-09-30 NOTE — Patient Instructions (Addendum)
Chest x-ray today Continue on trilogy vent at bedtime with oxygen Order for portable oxygen concentrator Continue on oxygen 2 L Continue on Flovent twice daily.   May use pro-air as needed wheezing Follow-up in 6-8 weeks with Dr. Delton CoombesByrum and as needed Please contact office for sooner follow up if symptoms do not improve or worsen or seek emergency care

## 2017-09-30 NOTE — Addendum Note (Signed)
Addended by: Pilar GrammesEYNOLDS, Shateria Paternostro C on: 09/30/2017 10:27 AM   Modules accepted: Orders

## 2017-09-30 NOTE — Assessment & Plan Note (Signed)
Continue on oxygen Order for portable oxygen concentrator sent to the DME  Plan  Patient Instructions  Chest x-ray today Continue on trilogy vent at bedtime with oxygen Order for portable oxygen concentrator Continue on oxygen 2 L Continue on Flovent twice daily.   May use pro-air as needed wheezing Follow-up in 6-8 weeks with [name] and as needed Please contact office for sooner follow up if symptoms do not improve or worsen or seek emergency care

## 2017-09-30 NOTE — Assessment & Plan Note (Signed)
Controlled on current regimen   Plan  Patient Instructions  Chest x-ray today Continue on trilogy vent at bedtime with oxygen Order for portable oxygen concentrator Continue on oxygen 2 L Continue on Flovent twice daily.   May use pro-air as needed wheezing Follow-up in 6-8 weeks with [name] and as needed Please contact office for sooner follow up if symptoms do not improve or worsen or seek emergency care

## 2017-09-30 NOTE — Assessment & Plan Note (Signed)
Clinically improved on abx   Plan  Patient Instructions  Chest x-ray today Continue on trilogy vent at bedtime with oxygen Order for portable oxygen concentrator Continue on oxygen 2 L Continue on Flovent twice daily.   May use pro-air as needed wheezing Follow-up in 6-8 weeks with Dr. Delton CoombesByrum and as needed Please contact office for sooner follow up if symptoms do not improve or worsen or seek emergency care

## 2017-09-30 NOTE — Telephone Encounter (Signed)
Patient mother called nurse line. States patient needs a new hospital bed. The one he has is over 24 years old doesn't elevate anymore.  States he needs the "Big Boy Bed". Current weight is 350 lbs.   Mother's Octavio Graves(Bonita) number is [phone]. Ples SpecterAlisa Brake, RN [facility] RN)

## 2017-10-01 ENCOUNTER — Telehealth: Payer: Self-pay | Admitting: Internal Medicine

## 2017-10-01 ENCOUNTER — Other Ambulatory Visit: Payer: Self-pay | Admitting: Adult Health

## 2017-10-01 ENCOUNTER — Other Ambulatory Visit: Payer: Self-pay | Admitting: *Deleted

## 2017-10-01 DIAGNOSIS — J189 Pneumonia, unspecified organism: Secondary | ICD-10-CM

## 2017-10-01 NOTE — Progress Notes (Signed)
Was able to talk to the patient and mother regarding results.  They verbalized an understanding of what was discussed. No further questions at this time.   CXR placed for ROV with Dr. Delton CoombesByrum in April

## 2017-10-01 NOTE — Telephone Encounter (Signed)
Clinical info completed on handicap placard form.  Place form in Dr. Enriqueta ShutterMayo's box for completion.  Feliz BeamHARTSELL,  Jazariah Teall, CMA

## 2017-10-01 NOTE — Telephone Encounter (Signed)
Disability parking placard form dropped off  at front desk for completion.  Verified that patient section of form has been completed.  Last DOS with PCP was 05/05/17.  Placed form in blue team folder to be completed by clinical staff.  Lina Sarheryl A Stanley

## 2017-10-02 ENCOUNTER — Other Ambulatory Visit: Payer: Self-pay | Admitting: Internal Medicine

## 2017-10-03 ENCOUNTER — Other Ambulatory Visit: Payer: Self-pay | Admitting: Internal Medicine

## 2017-10-03 DIAGNOSIS — Q8711 Prader-Willi syndrome: Secondary | ICD-10-CM

## 2017-10-03 DIAGNOSIS — E662 Morbid (severe) obesity with alveolar hypoventilation: Secondary | ICD-10-CM

## 2017-10-03 NOTE — Telephone Encounter (Signed)
Form completed and placed on clip outside nursing office.

## 2017-10-03 NOTE — Telephone Encounter (Signed)
Mother informed that [facility] for bed is [facility] [phone]. Jazmin Hartsell,CMA

## 2017-10-03 NOTE — Telephone Encounter (Signed)
Pt mother notified forms ready for pickup at front desk.  Shawna OrleansMeredith B Garfield Coiner, RN

## 2017-10-03 NOTE — Progress Notes (Unsigned)
dme

## 2017-10-23 ENCOUNTER — Other Ambulatory Visit: Payer: Self-pay

## 2017-10-24 MED ORDER — OXYBUTYNIN CHLORIDE 5 MG PO TABS: 10.0000 mg | ORAL_TABLET | Freq: Every day | ORAL | 0 refills | Status: DC

## 2017-11-13 ENCOUNTER — Ambulatory Visit: Payer: Medicaid Other | Admitting: Emergency Medicine

## 2017-11-13 ENCOUNTER — Encounter: Payer: Self-pay | Admitting: Emergency Medicine

## 2017-11-13 DIAGNOSIS — J45909 Unspecified asthma, uncomplicated: Secondary | ICD-10-CM

## 2017-11-13 DIAGNOSIS — E662 Morbid (severe) obesity with alveolar hypoventilation: Secondary | ICD-10-CM | POA: Diagnosis not present

## 2017-11-13 NOTE — Patient Instructions (Addendum)
Please continue your oxygen at 2L/min during the day Continue your good compliance with your trilogy device during the night. We will confirm your ventilator settings today.  We will ask [facility] whether there is a cleaning device for your ventilator mask Use your albuterol 2 puffs as needed for shortness of breath Continue your exercise program! Follow with Dr Delton CoombesByrum in 6 months or sooner if you have any problems

## 2017-11-13 NOTE — Assessment & Plan Note (Signed)
Presumed. Unable to perform spirometry.   Use your albuterol 2 puffs as needed for shortness of breath

## 2017-11-13 NOTE — Progress Notes (Signed)
Subjective:    Patient ID: [name], male    DOB: 02/13/1996, 22 y.o.   MRN: [id number]   Asthma  He complains of shortness of breath and wheezing. There is no cough. Associated symptoms include postnasal drip. Pertinent negatives include no ear pain, fever, headaches, rhinorrhea, sneezing, sore throat or trouble swallowing. His past medical history is significant for asthma.   22 year old never smoker with a history of Prader-Willi syndrome complicated by severe obesity and OSA/OHS. History of atrophic kidney, allergic rhinitis, hypertension. He was also diagnosed as a child with asthma. I have followed him on BiPAP 20/15+4 L/m oxygen. He was just in the hospital with acute on chronic hypercapnic respiratory failure in setting , discharge 9/26 on BiPAP 20/8 + O2 4L/min. He was diuresed and sent home on torsemide 60mg  qd.  Some increased cough and clear mucous last few days.  Note ABG below post discharge with significant compensated hypercapnia  ROV 07/04/17 --pleasant 22 year old young man with Prader-Willi syndrome, obesity, OSA/OHS.  He has been managed in the past with BiPAP to support his chronic ventilatory failure.  We have made arrangements to change him to a trilogy ventilator.  Received it about 10 days ago. Has tolerated well. More active during the day. Tremors also on torsemide which we adjusted last visit.  Serum creatinine stable on evaluation 10/8. Takes {Pro Air every morning. Wants to get back to the pool, needs to be weaned off of daytime oxygen in order to accomplish this.  ROV 11/13/17 -- 22 yo man with Prader-Willi, associated progressive obesity, severe OSA with OHS. Also with possible obstructive lung disease.  he was admitted to the hospital the beginning of this year with acute on chronic hypercapnic and hypoxemic respiratory failure, possibly in the setting of an acute infection.  He had renal insufficiency at the time as well.  He had been managed on BiPAP but is device  was changed to a Trilogy ventilator. He has a full face mask, great compliance. He is on 2L/min. He is doing water exercises several times a week. He rarely needs extra albuterol, uses it every morning. He is interested in a possible cleaning device for the BiPAP mask.    Review of Systems  Constitutional: Negative for fever and unexpected weight change.  HENT: Positive for congestion, postnasal drip and sinus pressure. Negative for dental problem, ear pain, nosebleeds, rhinorrhea, sneezing, sore throat and trouble swallowing.   Eyes: Negative for redness and itching.  Respiratory: Positive for shortness of breath and wheezing. Negative for cough and chest tightness.   Cardiovascular: Negative for palpitations and leg swelling.  Gastrointestinal: Negative for nausea and vomiting.  Genitourinary: Negative for dysuria.  Musculoskeletal: Negative for joint swelling.  Skin: Negative for rash.  Neurological: Negative for headaches.  Hematological: Does not bruise/bleed easily.  Psychiatric/Behavioral: Negative for dysphoric mood. The patient is not nervous/anxious.        Objective:   Physical Exam Vitals:   11/13/17 0910  BP: 116/64  Pulse: (!) 130  SpO2: 91%  Weight: (!) 346 lb (156.9 kg)   Gen: Pleasant, morbidly obese, in no distress, Interacting and answering questions. O2 in place  ENT: No lesions,  mouth clear,  oropharynx clear, no active  postnasal drip  Neck: No JVD, soft insp and exp stridor  Cardiovascular: RRR, heart sounds normal, no murmur or gallops, no peripheral edema  Musculoskeletal: No deformities, no cyanosis or clubbing  Neuro: alert, answers simple questions, moves all ext, faces consistent w  his hx P-W syndrome.   Skin: Warm, no lesions or rashes     Assessment & Plan:  Asthma Presumed. Unable to perform spirometry.   Use your albuterol 2 puffs as needed for shortness of breath  Obesity hypoventilation syndrome (HCC) Please continue your oxygen at  2L/min during the day Continue your good compliance with your trilogy device during the night. We will confirm your ventilator settings today.  We will ask [facility] whether there is a cleaning device for your ventilator mask Continue your exercise program! Follow with Dr Delton CoombesByrum in 6 months or sooner if you have any problems  Levy Pupaobert Ascencion Coye, MD, PhD 11/13/2017, 9:41 AM [facility] [phone] or if no answer [phone]

## 2017-11-13 NOTE — Assessment & Plan Note (Signed)
Please continue your oxygen at 2L/min during the day Continue your good compliance with your trilogy device during the night. We will confirm your ventilator settings today.  We will ask [facility] whether there is a cleaning device for your ventilator mask Continue your exercise program! Follow with Dr Delton CoombesByrum in 6 months or sooner if you have any problems

## 2017-11-18 ENCOUNTER — Other Ambulatory Visit: Payer: Self-pay | Admitting: Internal Medicine

## 2017-12-16 ENCOUNTER — Other Ambulatory Visit: Payer: Self-pay | Admitting: Internal Medicine

## 2017-12-31 ENCOUNTER — Other Ambulatory Visit: Payer: Self-pay

## 2017-12-31 NOTE — Telephone Encounter (Signed)
Patient mother called and needs albuterol for nebs refilled.   Call back is [phone].  Ples Specter, RN [facility] RN)

## 2018-01-01 ENCOUNTER — Emergency Department (HOSPITAL_COMMUNITY)
Admission: EM | Admit: 2018-01-01 | Discharge: 2018-01-01 | Disposition: A | Discharge status: Home / Self Care | Payer: Medicaid Other | Attending: Physician Assistant | Admitting: Physician Assistant

## 2018-01-01 ENCOUNTER — Encounter (HOSPITAL_COMMUNITY): Payer: Self-pay | Admitting: *Deleted

## 2018-01-01 ENCOUNTER — Telehealth: Payer: Self-pay | Admitting: Family Medicine

## 2018-01-01 ENCOUNTER — Emergency Department (HOSPITAL_COMMUNITY): Payer: Medicaid Other

## 2018-01-01 ENCOUNTER — Other Ambulatory Visit: Payer: Self-pay

## 2018-01-01 DIAGNOSIS — W19XXXA Unspecified fall, initial encounter: Secondary | ICD-10-CM

## 2018-01-01 DIAGNOSIS — Y998 Other external cause status: Secondary | ICD-10-CM | POA: Insufficient documentation

## 2018-01-01 DIAGNOSIS — Y929 Unspecified place or not applicable: Secondary | ICD-10-CM | POA: Diagnosis not present

## 2018-01-01 DIAGNOSIS — Y939 Activity, unspecified: Secondary | ICD-10-CM | POA: Insufficient documentation

## 2018-01-01 DIAGNOSIS — S0083XA Contusion of other part of head, initial encounter: Secondary | ICD-10-CM | POA: Diagnosis not present

## 2018-01-01 DIAGNOSIS — I110 Hypertensive heart disease with heart failure: Secondary | ICD-10-CM | POA: Insufficient documentation

## 2018-01-01 DIAGNOSIS — Z794 Long term (current) use of insulin: Secondary | ICD-10-CM | POA: Insufficient documentation

## 2018-01-01 DIAGNOSIS — I5032 Chronic diastolic (congestive) heart failure: Secondary | ICD-10-CM | POA: Diagnosis not present

## 2018-01-01 DIAGNOSIS — E119 Type 2 diabetes mellitus without complications: Secondary | ICD-10-CM | POA: Insufficient documentation

## 2018-01-01 DIAGNOSIS — Z79899 Other long term (current) drug therapy: Secondary | ICD-10-CM | POA: Diagnosis not present

## 2018-01-01 DIAGNOSIS — W07XXXA Fall from chair, initial encounter: Secondary | ICD-10-CM | POA: Diagnosis not present

## 2018-01-01 DIAGNOSIS — S0990XA Unspecified injury of head, initial encounter: Secondary | ICD-10-CM | POA: Diagnosis present

## 2018-01-01 MED ORDER — ALBUTEROL SULFATE (2.5 MG/3ML) 0.083% IN NEBU: INHALATION_SOLUTION | RESPIRATORY_TRACT | 12 refills | Status: DC

## 2018-01-01 NOTE — ED Provider Notes (Signed)
[facility] EMERGENCY DEPARTMENT Provider Note   CSN: [id number] Arrival date & time: 01/01/18  1913     History   Chief Complaint Chief Complaint  Patient presents with  . Fall    HPI [name] is a 22 y.o. male.  HPI   22 year old male with past medical history significant for Prader-Willi syndrome, obesity.  Presenting after mechanical fall out of his chair.  Patient hit the left side of his head.  Patient had a large amount of swelling to that area.  Mom concerned brought him here to the emergency department.  No loss of consciousness.  No current complaints.  Past Medical History:  Diagnosis Date  . Asthma   . Diabetes mellitus without complication ([place])   . Hypertension    Pulmomary htn  . Kidney dysfunction    left  . Obesity   . Obesity hypoventilation syndrome ([place])   . Prader-Willi syndrome   . Raynaud's phenomenon   . Sleep apnea     Patient Active Problem List   Diagnosis Date Noted  . Pneumonia 09/30/2017  . Acute renal insufficiency   . Congestive heart failure ([place])   . Acute respiratory distress 08/28/2017  . Cellulitis of abdominal wall 08/08/2017  . Calculus of gallbladder without cholecystitis without obstruction 05/07/2017  . Diastolic congestive heart failure ([place])   . Tremor bilateral hands 10/27/2014  . Diabetes mellitus type 2 in obese ([place]) 09/27/2014  . Shortness of breath   . Chronic respiratory failure ([place]) 09/15/2014  . Allergic rhinitis 01/07/2013  . Obesity hypoventilation syndrome ([place]) 10/05/2010  . VISUAL IMPAIRMENT 11/13/2009  . RAYNAUD'S SYNDROME 08/10/2009  . Essential hypertension, benign 01/07/2008  . ORTHOSTATIC PROTEINURIA 01/07/2008  . OBESITY, MORBID 02/27/2007  . 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 Asthma 10/16/2006  . SYMPTOM, ENURESIS, NOCTURNAL 10/16/2006    Past Surgical History:  Procedure Laterality Date  . NISSEN FUNDOPLICATION    . TONSILLECTOMY          Home  Medications    Prior to Admission medications   Medication Sig Start Date End Date Taking? Authorizing Provider  ACCU-CHEK AVIVA PLUS test strip as directed 11/02/15   Leone Haven, MD  ACCU-CHEK SOFTCLIX LANCETS lancets Use as instructed 11/02/15   Melancon, York Ram, MD  albuterol (PROVENTIL) (2.5 MG/3ML) 0.083% nebulizer solution inhale contents of 1 vial in nebulizer once daily 01/01/18   Mayo, Pete Pelt, MD  calcium carbonate (TUMS - DOSED IN MG ELEMENTAL CALCIUM) 500 MG chewable tablet Chew 2 tablets (400 mg of elemental calcium total) by mouth 3 (three) times daily between meals as needed for indigestion or heartburn. 04/30/17   Sela Hilding, MD  cetirizine (ZYRTEC) 10 MG tablet take 1 tablet by mouth once daily 09/23/17   Mayo, Pete Pelt, MD  desmopressin (DDAVP) 0.2 MG tablet Take 0.4 mg by mouth at bedtime.     [provider]  fluticasone (FLONASE) 50 MCG/ACT nasal spray INSTILL 2 SPRAYS INTO EACH NOSTRIL ONCE DAILY 12/17/17   Mayo, Pete Pelt, MD  fluticasone (FLOVENT HFA) 110 MCG/ACT inhaler Inhale 2 puffs into the lungs 2 (two) times daily. 04/16/17   Mayo, Pete Pelt, MD  hydrochlorothiazide (HYDRODIURIL) 25 MG tablet Take 1 tablet (25 mg total) by mouth daily. 03/06/17   Mercy Riding, MD  Lancets 30G MISC 1 Device by Does not apply route daily before breakfast. 10/06/14   Leone Haven, MD  metFORMIN (GLUCOPHAGE-XR) 500 MG 24 hr tablet TAKE 1  TABLET BY MOUTH EVERY DAY WITH BREAKFAST 12/17/17   Mayo, Pete Pelt, MD  Misc. Devices MISC Please provide a pulse oximeter for the ear lobe. Patient has diagnosis of Raynaud, asthma 09/16/11   Judithann Sheen, MD  montelukast (SINGULAIR) 10 MG tablet Take 1 tablet (10 mg total) by mouth at bedtime. 04/16/17   Mayo, Pete Pelt, MD  olopatadine (PATANOL) 0.1 % ophthalmic solution Place 1 drop into both eyes 2 (two) times daily as needed for allergies. 01/20/17   [provider]  oxybutynin (DITROPAN) 5 MG tablet TAKE 2  TABLETS BY MOUTH AT BEDTIME 11/19/17   Mayo, Pete Pelt, MD  pantoprazole (PROTONIX) 20 MG tablet Take 1 tablet (20 mg total) by mouth daily. 04/30/17   Sela Hilding, MD  PREVIDENT 5000 ENAMEL PROTECT 1.1-5 % PSTE Take 1 application by mouth 2 (two) times daily.  01/14/17   [provider]  PROAIR HFA 108 (90 Base) MCG/ACT inhaler inhale 2 puff by mouth every 6 hours if needed 05/09/16   Collene Gobble, MD  Respiratory Therapy Supplies (NEBULIZER/ADULT MASK) KIT 1 Device by Does not apply route once. 05/20/14   Leone Haven, MD  Respiratory Therapy Supplies MISC BiPAP IPAP:20, EPAP: 8, back up respiratory rate: 10, FIO2: 35%. 04/30/17   Sela Hilding, MD  Spacer/Aero-Holding Chambers (AEROCHAMBER MV) inhaler Use with albuterol inhaler 04/20/12   Verdie Drown, Samuel Germany, MD  torsemide (DEMADEX) 20 MG tablet Take 3 tablets (60 mg total) by mouth daily. 09/23/17   Collene Gobble, MD    Family History Family History  Problem Relation Age of Onset  . Migraines Mother     Social History Social History   Tobacco Use  . Smoking status: Never Smoker  . Smokeless tobacco: Never Used  Substance Use Topics  . Alcohol use: No  . Drug use: No     Allergies   Patient has no known allergies.   Review of Systems Review of Systems  Unable to perform ROS: Other  Neurological: Negative for dizziness.     Physical Exam Updated Vital Signs BP (!) 163/99 (BP Location: Right Arm)   Pulse 96   Temp 98.5 F (36.9 C) (Oral)   Resp 18   SpO2 99%   Physical Exam  Constitutional: He is oriented to person, place, and time. He appears well-nourished.  Morbidly obese African American male.  HENT:  Head: Normocephalic.  Eyes: Conjunctivae are normal. Right eye exhibits no discharge. Left eye exhibits no discharge.  Cardiovascular: Normal rate and regular rhythm.  No murmur heard. Pulmonary/Chest: Effort normal and breath sounds normal. No respiratory distress.  Abdominal: Soft.  He exhibits no distension. There is no tenderness.  Neurological: He is oriented to person, place, and time.  Hematoma to left temporal frontal bone.  6 x 3.  No overlying abrasions.  Skin: Skin is warm and dry. He is not diaphoretic.  Psychiatric: He has a normal mood and affect. His behavior is normal.  Nursing note and vitals reviewed.    ED Treatments / Results  Labs (all labs ordered are listed, but only abnormal results are displayed) Labs Reviewed - No data to display  EKG None  Radiology Ct Head Wo Contrast  Result Date: 01/01/2018 CLINICAL DATA:  Fall, head injury, left hematoma EXAM: CT HEAD WITHOUT CONTRAST CT CERVICAL SPINE WITHOUT CONTRAST TECHNIQUE: Multidetector CT imaging of the head and cervical spine was performed following the standard protocol without intravenous contrast. Multiplanar CT image reconstructions of  the cervical spine were also generated. COMPARISON:  04/16/2017 FINDINGS: CT HEAD FINDINGS Brain: No evidence of acute infarction, hemorrhage, hydrocephalus, extra-axial collection or mass lesion/mass effect. Vascular: No hyperdense vessel or unexpected calcification. Skull: Normal. Negative for fracture or focal lesion. Sinuses/Orbits: Mild partial opacification of the bilateral sphenoid sinuses. Mastoid air cells are clear. Other: Soft tissue swelling/hematoma overlying the left frontal bone (series 3/image 30). CT CERVICAL SPINE FINDINGS Alignment: Straightening of the cervical spine, likely positional. Skull base and vertebrae: No acute fracture. No primary bone lesion or focal pathologic process. Soft tissues and spinal canal: No prevertebral fluid or swelling. No visible canal hematoma. Disc levels: Vertebral body heights and intervertebral disc spaces are maintained. Spinal canal is patent. Upper chest: Visualized lung apices are clear. Other: Visualized thyroid is unremarkable. IMPRESSION: Soft tissue swelling/hematoma overlying the left frontal bone. No  evidence of calvarial fracture. No evidence of acute intracranial abnormality. Normal cervical spine CT. Electronically Signed   By: Julian Hy M.D.   On: 01/01/2018 20:42   Ct Cervical Spine Wo Contrast  Result Date: 01/01/2018 CLINICAL DATA:  Fall, head injury, left hematoma EXAM: CT HEAD WITHOUT CONTRAST CT CERVICAL SPINE WITHOUT CONTRAST TECHNIQUE: Multidetector CT imaging of the head and cervical spine was performed following the standard protocol without intravenous contrast. Multiplanar CT image reconstructions of the cervical spine were also generated. COMPARISON:  04/16/2017 FINDINGS: CT HEAD FINDINGS Brain: No evidence of acute infarction, hemorrhage, hydrocephalus, extra-axial collection or mass lesion/mass effect. Vascular: No hyperdense vessel or unexpected calcification. Skull: Normal. Negative for fracture or focal lesion. Sinuses/Orbits: Mild partial opacification of the bilateral sphenoid sinuses. Mastoid air cells are clear. Other: Soft tissue swelling/hematoma overlying the left frontal bone (series 3/image 30). CT CERVICAL SPINE FINDINGS Alignment: Straightening of the cervical spine, likely positional. Skull base and vertebrae: No acute fracture. No primary bone lesion or focal pathologic process. Soft tissues and spinal canal: No prevertebral fluid or swelling. No visible canal hematoma. Disc levels: Vertebral body heights and intervertebral disc spaces are maintained. Spinal canal is patent. Upper chest: Visualized lung apices are clear. Other: Visualized thyroid is unremarkable. IMPRESSION: Soft tissue swelling/hematoma overlying the left frontal bone. No evidence of calvarial fracture. No evidence of acute intracranial abnormality. Normal cervical spine CT. Electronically Signed   By: Julian Hy M.D.   On: 01/01/2018 20:42    Procedures Procedures (including critical care time)  Medications Ordered in ED Medications - No data to display   Initial Impression /  Assessment and Plan / ED Course  I have reviewed the triage vital signs and the nursing notes.  Pertinent labs & imaging results that were available during my care of the patient were reviewed by me and considered in my medical decision making (see chart for details).     22 year old male with past medical history significant for Prader-Willi syndrome, obesity.  Presenting after mechanical fall out of his chair.  Patient hit the left side of his head.  Patient had a large amount of swelling to that area.  Mom concerned brought him here to the emergency department.  No loss of consciousness.  No current complaints.  11:04 PM CT is negative for intracranial bleed.  We will have him use ice, Tylenol as an outpatient.   Final Clinical Impressions(s) / ED Diagnoses   Final diagnoses:  None    ED Discharge Orders    None       Macarthur Critchley, MD 01/01/18 2304

## 2018-01-01 NOTE — Discharge Instructions (Signed)
Your son's CT was reassuring.  No evidence of bleeding inside the brain.  Please use Tylenol and ice.

## 2018-01-01 NOTE — ED Provider Notes (Signed)
Patient placed in Quick Look pathway, seen and evaluated   Chief Complaint: Fall, head pain  HPI:   Patient was reaching forward in his chair when he fell, hitting the left parietal head on a small shelf.  No loss of consciousness.  No bleeding.  He has been acting normal since the fall.  Patient has a large tender area of swelling.  No injury noted elsewhere.  Pain improved with some ibuprofen.  ROS: Head injury  Physical Exam:   Gen: No distress  Neuro: Alert to baseline per mom  Skin: Warm    Focused Exam: 4 cm area of swelling to the left parietal without active bleeding.  Moving neck with no pain.  No obvious new neurologic deficits. Perrla and eomi.  Will order head ct and neck to r/o injury   Initiation of care has begun. The patient has been counseled on the process, plan, and necessity for staying for the completion/evaluation, and the remainder of the medical screening examination    Alveria Apley, PA-C 01/01/18 2001    Raeford Razor, MD 01/02/18 [phone]

## 2018-01-01 NOTE — Telephone Encounter (Signed)
After Hours Emergency Line  [name]'s mother called regarding a fall that occurred about 20 min ago from his rolling chair.  She states he is special needs and is usually seated in this chair. He was reaching for something when he fell out. Mom's sister witnessed the fall and he did hit his head on the side of a small shelf but no LOC.  Fall was from about 2-3 feet max.  He is able to verbalize pain and reports a headache. No episodes of vomiting.  He has been acting like himself.  Has been alert and talkative since the fall.  Mom is particularly concerned because he has a "knot" on his head which is about the size of her palm.  No evidence of open lacerations or bleeding of the scalp.  She has not yet given him any pain medication because she wanted to speak with a doctor first.    Advised mother to give him some Ibuprofen for the pain and bring him to the ED for evaluation. I am more concerned about the knot the size of her palm being a subdural hematoma and may warrant a head CT.  Recommended that mom monitor him for signs of worsening or confusion.  She expressed good understanding and is bringing him in now.   Will route this note to PCP.   Freddrick March MD [facility] PGY-2

## 2018-01-01 NOTE — ED Triage Notes (Signed)
Pt came from medical [facility]. Staff stated that he leaned forward and fell out of his chair. Hit head on furniture. No loc. Has hematoma noted to left side of head.

## 2018-01-01 NOTE — ED Notes (Signed)
ED Provider at bedside. 

## 2018-01-15 ENCOUNTER — Other Ambulatory Visit: Payer: Self-pay | Admitting: Internal Medicine

## 2018-02-12 ENCOUNTER — Other Ambulatory Visit: Payer: Self-pay | Admitting: *Deleted

## 2018-02-16 MED ORDER — FLUTICASONE PROPIONATE 50 MCG/ACT NA SUSP: NASAL | 0 refills | Status: DC

## 2018-02-16 MED ORDER — METFORMIN HCL ER 500 MG PO TB24: ORAL_TABLET | ORAL | 0 refills | Status: DC

## 2018-02-16 MED ORDER — PANTOPRAZOLE SODIUM 20 MG PO TBEC: 20.0000 mg | DELAYED_RELEASE_TABLET | Freq: Every day | ORAL | 0 refills | Status: DC

## 2018-03-02 ENCOUNTER — Ambulatory Visit (HOSPITAL_COMMUNITY)
Admission: RE | Admit: 2018-03-02 | Discharge: 2018-03-02 | Disposition: A | Discharge status: Home / Self Care | Payer: Medicaid Other | Source: Ambulatory Visit | Attending: Family Medicine | Admitting: Family Medicine

## 2018-03-02 ENCOUNTER — Ambulatory Visit (INDEPENDENT_AMBULATORY_CARE_PROVIDER_SITE_OTHER): Payer: Medicaid Other | Admitting: Family Medicine

## 2018-03-02 ENCOUNTER — Other Ambulatory Visit: Payer: Self-pay

## 2018-03-02 VITALS — BP 110/78 | HR 111 | Temp 98.7°F

## 2018-03-02 DIAGNOSIS — M79671 Pain in right foot: Secondary | ICD-10-CM | POA: Diagnosis not present

## 2018-03-02 DIAGNOSIS — M19071 Primary osteoarthritis, right ankle and foot: Secondary | ICD-10-CM | POA: Insufficient documentation

## 2018-03-02 NOTE — Assessment & Plan Note (Signed)
Acute.  Uncertain etiology.  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  There is report of limping by history by caregiver.  No signs of cellulitis. - Please x-ray of right foot to rule out obvious facture - Reviewed return precautions - RTC 1 week if symptoms persist

## 2018-03-02 NOTE — Patient Instructions (Signed)
Thank you for coming in to see us today. Please see below to review our plan for today's visit.  I am not certain what is causing the right foot pain and limping.  We will check with an x-ray to make sure there are no signs of fractures.  If this results is normal, we will continue to monitor.  I will call if there is any abnormalities otherwise you should receive results in the mail.  I usually will hear back within 24 hours.  Return to the clinic in 1 week if symptoms persist.  Please call the clinic at [phone] if your symptoms worsen or you have any concerns. It was our pleasure to serve you.  Durward Parcelavid McMullen, DO [facility], PGY-3

## 2018-03-02 NOTE — Progress Notes (Signed)
   Subjective   Patient ID: Joshua Buryazhae M Camera    DOB: Dec 15, 1995, 22 y.o. male   MRN: [id number]  CC: "right foot swelling"  HPI: [name] is a 22 y.o. male who presents to clinic today for the following:  EXTREMITY PAIN  Location: right foot Pain started: 1.5 weeks ago Pain is: cannot describe Severity: "worse" Medications tried: none Recent trauma: no Similar pain previously: no  Symptoms Redness: no Swelling: some Fever: no Weakness: no Weight loss: no Rash: no  ROS: see HPI for pertinent.  PMFSH: Prader-Willi syndrome, OSA and OHS, HTN, diastolic HF, morbid obesity.  Surgical history tonsillectomy, Nissen.  Family history of migraines.  Smoking status reviewed. Medications reviewed.  Objective   BP 110/78   Pulse (!) 111   Temp 98.7 F (37.1 C) (Oral)   SpO2 95% Comment: 2L of O2 Vitals and nursing note reviewed.  General: morbidly obese young adult male in wheelchair, well nourished, well developed, NAD with non-toxic appearance, texting on phone HEENT: normocephalic, atraumatic, moist mucous membranes Cardiovascular: regular rate and rhythm without murmurs, rubs, or gallops Lungs: clear to auscultation bilaterally with normal work of breathing Skin: warm, dry, no rashes or lesions, cap refill < 2 seconds Extremities: warm and well perfused, normal tone, subtle edema to right foot without obvious tenderness, no erythema or fluctuance  Assessment & Plan   Acute pain of right foot Acute.  Uncertain etiology.  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  There is report of limping by history by caregiver.  No signs of cellulitis. - Please x-ray of right foot to rule out obvious facture - Reviewed return precautions - RTC 1 week if symptoms persist  Orders Placed This Encounter  Procedures  . DG Foot Complete Right    Right foot pain for 2 weeks.    Standing Status:   Future      Number of Occurrences:   1    Standing Expiration Date:   05/04/2019    Order Specific Question:   Reason for Exam (SYMPTOM  OR DIAGNOSIS REQUIRED)    Answer:   foot pain    Order Specific Question:   Preferred imaging location?    Answer[facility]    Order Specific Question:   Call Results- Best Contact Number?    Answer:   [phone]    Order Specific Question:   Radiology Contrast Protocol - do NOT remove file path    Answer:   [email or web address]   No orders of the defined types were placed in this encounter.   Durward Parcelavid Misti Towle, DO [facility], PGY-3 03/02/2018, 12:28 PM

## 2018-03-03 ENCOUNTER — Encounter: Payer: Self-pay | Admitting: Family Medicine

## 2018-03-04 ENCOUNTER — Telehealth: Payer: Self-pay

## 2018-03-04 NOTE — Telephone Encounter (Signed)
Update given for normal xray.  Durward Parcelavid McMullen, DO [facility], PGY-3

## 2018-03-04 NOTE — Telephone Encounter (Signed)
Patient mother, Octavio GravesBonita, calling for xray results.  Call back is [phone]  Ples SpecterAlisa Athea Haley, RN [facility] RN)

## 2018-03-10 ENCOUNTER — Encounter: Payer: Self-pay | Admitting: Family Medicine

## 2018-03-10 ENCOUNTER — Ambulatory Visit (INDEPENDENT_AMBULATORY_CARE_PROVIDER_SITE_OTHER): Payer: Medicaid Other | Admitting: Family Medicine

## 2018-03-10 ENCOUNTER — Other Ambulatory Visit: Payer: Self-pay

## 2018-03-10 VITALS — BP 118/80 | HR 104 | Temp 98.1°F | Wt 353.0 lb

## 2018-03-10 DIAGNOSIS — E1169 Type 2 diabetes mellitus with other specified complication: Secondary | ICD-10-CM | POA: Diagnosis not present

## 2018-03-10 DIAGNOSIS — M79671 Pain in right foot: Secondary | ICD-10-CM

## 2018-03-10 DIAGNOSIS — E669 Obesity, unspecified: Secondary | ICD-10-CM

## 2018-03-10 LAB — POCT GLYCOSYLATED HEMOGLOBIN (HGB A1C): HBA1C, POC (CONTROLLED DIABETIC RANGE): 7.2 % — AB (ref 0.0–7.0)

## 2018-03-10 NOTE — Progress Notes (Signed)
    Subjective:  [name] is a 22 y.o. male [facility] with a chief complaint of R foot pain. Caregiver is historian.  HPI:  Last seen for this on 03/02/18, had negative R foot xray at that time.  Caregiver states that R foot pain seems to be improving because last visit he had to be wheeled in by wheelchair but now is walking on it. Still limping. Unable to localize where the pain is or the character. Just able to say that like wearing crocs shoes more than sneakers right now.  No redness or swelling. No rashes. Did try ibuprofen for a couple of days when this first started a few weeks ago. Not currently doing anything for it. No h/o trauma or previous foot pain. Has continued to gain weight because of Prader Willi syndrome.   ROS: Per HPI  Objective:  Physical Exam: BP 118/80   Pulse (!) 104   Temp 98.1 F (36.7 C) (Oral)   Wt (!) 353 lb (160.1 kg)   SpO2 94% Comment: 2L of O2  BMI 74.42 kg/m   Gen: morbidly obese AA male, in NAD MSK: R foot without erythema or swelling. No TTP over joints in all toes, ankles, knees bilaterally. Gait is wide based with heel strike and slight limp that minimizes pressure on R foot. Can bear weight on R foot.  Skin: warm, dry. No rashes.    Assessment/Plan:  Acute pain of right foot Uncertain etiology. Intellectual disability greatly limits history. DDx includes overuse given morbid obesity causing increased pressure on foot or arthritis or stress fracture. Xray was negative on 03/02/18 however so would not reimage at this time, could consider if not improved after another 3-4 weeks. DDx also includes gout since he has many risk factors, although has no joint swelling/redness to suggest this. Will check uric acid level to rule this out.    Leland HerElsia J Mirah Nevins, DO PGY-3, [facility] 03/10/2018 2:03 PM

## 2018-03-10 NOTE — Patient Instructions (Addendum)
If not better in another couple of weeks can think about reimaging.    We are checking some uric acid level lab today. We will call you with the results  Please bring all of your medications with you to each visit.   Sign up for My Chart to have easy access to your labs results, and communication with your primary care physician.  Feel free to call with any questions or concerns at any time, at [phone].   Take care,  Dr. Leland HerElsia J Marland Reine, DO [facility]

## 2018-03-10 NOTE — Assessment & Plan Note (Signed)
Uncertain etiology. Intellectual disability greatly limits history. DDx includes overuse given morbid obesity causing increased pressure on foot or arthritis or stress fracture. Xray was negative on 03/02/18 however so would not reimage at this time, could consider if not improved after another 3-4 weeks. DDx also includes gout since he has many risk factors, although has no joint swelling/redness to suggest this. Will check uric acid level to rule this out.

## 2018-03-11 ENCOUNTER — Telehealth: Payer: Self-pay | Admitting: Family Medicine

## 2018-03-11 DIAGNOSIS — I10 Essential (primary) hypertension: Secondary | ICD-10-CM

## 2018-03-11 DIAGNOSIS — M10272 Drug-induced gout, left ankle and foot: Secondary | ICD-10-CM

## 2018-03-11 LAB — URIC ACID: URIC ACID: 16.5 mg/dL — AB (ref 3.7–8.6)

## 2018-03-11 MED ORDER — AMLODIPINE BESYLATE 10 MG PO TABS: 10.0000 mg | ORAL_TABLET | Freq: Every day | ORAL | 3 refills | Status: DC

## 2018-03-11 MED ORDER — PREDNISONE 20 MG PO TABS: 40.0000 mg | ORAL_TABLET | Freq: Every day | ORAL | 0 refills | Status: AC

## 2018-03-11 NOTE — Telephone Encounter (Signed)
Talked with patient's mother who is pharmacy tech that patient has thiazide induced gout. Stop HCTZ, start morvasc 10mg  for BP. Unfortunately due to CKD cannot do colchicine. Start prednisone 40mg  qd and instructed to continnue until pain is gone and then continue for 2 more days. Patient is to follow up with PCP in 2-3 weeks for BP recheck and possible uric acid recheck in the setting of discussion about need for urate lowering therapy. All questions were answered and mother voiced good understanding.

## 2018-03-16 ENCOUNTER — Other Ambulatory Visit: Payer: Self-pay | Admitting: Family Medicine

## 2018-03-16 ENCOUNTER — Other Ambulatory Visit: Payer: Self-pay | Admitting: Emergency Medicine

## 2018-03-19 MED ORDER — HYDROCHLOROTHIAZIDE 25 MG PO TABS: 25.0000 mg | ORAL_TABLET | Freq: Every day | ORAL | 3 refills | Status: DC

## 2018-03-19 NOTE — Telephone Encounter (Signed)
2nd request. [name],CMA  

## 2018-03-25 ENCOUNTER — Telehealth: Payer: Self-pay | Admitting: Family Medicine

## 2018-03-25 MED ORDER — PREDNISONE 20 MG PO TABS: 40.0000 mg | ORAL_TABLET | Freq: Every day | ORAL | 0 refills | Status: DC

## 2018-03-25 NOTE — Telephone Encounter (Signed)
Will forward to Dr. Artist PaisYoo who saw patient most recently for this concern.  Jazmin Hartsell,CMA

## 2018-03-25 NOTE — Telephone Encounter (Signed)
Will send in a 1 week refill of prednisone but needs appointment for uric acid recheck and discussion of urate lower therapy. Will forward to pool resident that will see patient tomorrow.

## 2018-03-25 NOTE — Telephone Encounter (Signed)
Mother called back to check if provider has responded. Told her that provider has not had a chance to respond yet but that we will go ahead and make an appt for tomorrow in case provider felt he needed to be seen again. Told her if provider calls in more medication or has a different plan we will call her and cancel this appt. Mother appreciative.  Mother, Octavio GravesBonita, call back is [phone].  Ples SpecterAlisa Tasean Mancha, RN [facility] RN)

## 2018-03-25 NOTE — Telephone Encounter (Signed)
Pt has finished gout medication but he is still limping.  He is dragging it and not picking it up. It is back to where it was initially. It had been getting better until the medicine ended. Could he get a refill or should he be seen again?  Please advise

## 2018-03-26 ENCOUNTER — Other Ambulatory Visit: Payer: Self-pay

## 2018-03-26 ENCOUNTER — Ambulatory Visit (INDEPENDENT_AMBULATORY_CARE_PROVIDER_SITE_OTHER): Payer: Medicaid Other | Admitting: Family Medicine

## 2018-03-26 VITALS — BP 110/86 | HR 101 | Temp 98.7°F | Ht 59.0 in | Wt 368.0 lb

## 2018-03-26 DIAGNOSIS — M109 Gout, unspecified: Secondary | ICD-10-CM

## 2018-03-26 MED ORDER — COLCHICINE 0.6 MG PO TABS: 0.6000 mg | ORAL_TABLET | Freq: Every day | ORAL | 0 refills | Status: DC

## 2018-03-26 MED ORDER — ALLOPURINOL 100 MG PO TABS: 100.0000 mg | ORAL_TABLET | Freq: Every day | ORAL | 6 refills | Status: DC

## 2018-03-26 NOTE — Progress Notes (Signed)
   CC: Right foot pain  HPI  Comes with daytime caregiver, mom is at work.  R foot pain - we think it's gout. 7 days of prednisone, foot felt great on meds. 2 days after stopping prednisone (stopped thurs, started back Monday) he began limping again. No known trauma. This seems to be completely new gout, never had gout before.  Pain is mainly over right calcaneus.  They have not visualized any wounds.  They wonder whether he needs long-term gout medicine.  He was frustrated that his other medication was for a short course.  ROS: Denies CP, SOB, abdominal pain, dysuria, changes in BMs.   CC, SH/smoking status, and VS noted  Objective: BP 110/86   Pulse (!) 101   Temp 98.7 F (37.1 C) (Oral)   Ht 4\' 11"  (1.499 m)   Wt (!) 368 lb (166.9 kg)   SpO2 91%   BMI 74.33 kg/m  Gen: NAD, alert, cooperative, and pleasant obese male. HEENT: NCAT, EOMI, PERRL CV: RRR, no murmur Resp: CTAB, no wheezes, non-labored Abd: SNTND, BS present, no guarding or organomegaly Ext: No edema, warm.  Right calcaneus tender to palpation without overlying erythema or wound.  Range of motion exam limited by patient cooperation and body habitus but grossly normal. Neuro: Alert and oriented, Speech clear, No gross deficits  Assessment and plan:  Acute gout of right foot New onset of gout, likely secondary to multiple risk factors including obesity, DM.  Patient has what appears to be a rebound gout flare after short course of prednisone.  Given underlying CKD, will repeat BMP as well as avoid NSAIDs for treatment.  Will begin colchicine and allopurinol together today.  Patient and caregiver counseled that he will need to continue on both these until next visit at 4 weeks.  Would recommend 3 to 6 months of colchicine therapy while initiating allopurinol, which will likely be a long-term medicine for him.  Return in 4 weeks to reassess pain as well as consider titrating doses of both these medicines.   Orders Placed  This Encounter  Procedures  . Basic metabolic panel    Meds ordered this encounter  Medications  . allopurinol (ZYLOPRIM) 100 MG tablet    Sig: Take 1 tablet (100 mg total) by mouth daily. Take EVEN AFTER pain has resolved, this suppresses gout.    Dispense:  30 tablet    Refill:  6  . colchicine 0.6 MG tablet    Sig: Take 1 tablet (0.6 mg total) by mouth daily.    Dispense:  90 tablet    Refill:  0     Loni MuseKate Aronda Burford, MD, PGY3 03/27/2018 2:38 PM

## 2018-03-26 NOTE — Patient Instructions (Signed)
It was a pleasure to see you today! Thank you for choosing [facility] for your primary care. [name] was seen for gout pain.   Our plans for today were:  You should start the TWO new medicines for your gout. Take both of these once per day until you come back to see us in 1 month.   Do not take advil, ibuprofen, aleeve, etc, these are not safe with your kidney problems.   You can use tylenol for extra pain.   Best,  Dr. Chanetta Marshallimberlake

## 2018-03-27 DIAGNOSIS — M109 Gout, unspecified: Secondary | ICD-10-CM | POA: Insufficient documentation

## 2018-03-27 LAB — BASIC METABOLIC PANEL
BUN/Creatinine Ratio: 42 — ABNORMAL HIGH (ref 9–20)
BUN: 48 mg/dL — ABNORMAL HIGH (ref 6–20)
CO2: 29 mmol/L (ref 20–29)
Calcium: 9.5 mg/dL (ref 8.7–10.2)
Chloride: 94 mmol/L — ABNORMAL LOW (ref 96–106)
Creatinine, Ser: 1.14 mg/dL (ref 0.76–1.27)
GFR calc Af Amer: 103 mL/min/{1.73_m2} (ref >59)
GFR calc non Af Amer: 90 mL/min/{1.73_m2} (ref >59)
Glucose: 272 mg/dL — ABNORMAL HIGH (ref 65–99)
Potassium: 4.7 mmol/L (ref 3.5–5.2)
Sodium: 142 mmol/L (ref 134–144)

## 2018-03-27 NOTE — Assessment & Plan Note (Signed)
New onset of gout, likely secondary to multiple risk factors including obesity, DM.  Patient has what appears to be a rebound gout flare after short course of prednisone.  Given underlying CKD, will repeat BMP as well as avoid NSAIDs for treatment.  Will begin colchicine and allopurinol together today.  Patient and caregiver counseled that he will need to continue on both these until next visit at 4 weeks.  Would recommend 3 to 6 months of colchicine therapy while initiating allopurinol, which will likely be a long-term medicine for him.  Return in 4 weeks to reassess pain as well as consider titrating doses of both these medicines.

## 2018-04-21 ENCOUNTER — Other Ambulatory Visit: Payer: Self-pay | Admitting: Family Medicine

## 2018-04-25 ENCOUNTER — Inpatient Hospital Stay (HOSPITAL_COMMUNITY)
Admission: EM | Admit: 2018-04-25 | Discharge: 2018-05-03 | DRG: 193 | Disposition: A | Discharge status: Home Health | Payer: Medicaid Other | Attending: Family Medicine | Admitting: Family Medicine

## 2018-04-25 ENCOUNTER — Other Ambulatory Visit: Payer: Self-pay

## 2018-04-25 ENCOUNTER — Encounter (HOSPITAL_COMMUNITY): Payer: Self-pay | Admitting: Pharmacy Technician

## 2018-04-25 ENCOUNTER — Inpatient Hospital Stay (HOSPITAL_COMMUNITY): Payer: Medicaid Other

## 2018-04-25 ENCOUNTER — Emergency Department (HOSPITAL_COMMUNITY): Payer: Medicaid Other

## 2018-04-25 DIAGNOSIS — J9621 Acute and chronic respiratory failure with hypoxia: Secondary | ICD-10-CM | POA: Diagnosis present

## 2018-04-25 DIAGNOSIS — J189 Pneumonia, unspecified organism: Secondary | ICD-10-CM | POA: Diagnosis present

## 2018-04-25 DIAGNOSIS — J181 Lobar pneumonia, unspecified organism: Secondary | ICD-10-CM | POA: Diagnosis not present

## 2018-04-25 DIAGNOSIS — J811 Chronic pulmonary edema: Secondary | ICD-10-CM | POA: Diagnosis present

## 2018-04-25 DIAGNOSIS — Z7951 Long term (current) use of inhaled steroids: Secondary | ICD-10-CM

## 2018-04-25 DIAGNOSIS — K219 Gastro-esophageal reflux disease without esophagitis: Secondary | ICD-10-CM | POA: Diagnosis present

## 2018-04-25 DIAGNOSIS — I110 Hypertensive heart disease with heart failure: Secondary | ICD-10-CM | POA: Diagnosis present

## 2018-04-25 DIAGNOSIS — Q871 Congenital malformation syndromes predominantly associated with short stature: Secondary | ICD-10-CM | POA: Diagnosis not present

## 2018-04-25 DIAGNOSIS — I5030 Unspecified diastolic (congestive) heart failure: Secondary | ICD-10-CM | POA: Diagnosis not present

## 2018-04-25 DIAGNOSIS — D509 Iron deficiency anemia, unspecified: Secondary | ICD-10-CM | POA: Diagnosis present

## 2018-04-25 DIAGNOSIS — J810 Acute pulmonary edema: Secondary | ICD-10-CM | POA: Insufficient documentation

## 2018-04-25 DIAGNOSIS — R0602 Shortness of breath: Secondary | ICD-10-CM | POA: Diagnosis present

## 2018-04-25 DIAGNOSIS — J9690 Respiratory failure, unspecified, unspecified whether with hypoxia or hypercapnia: Secondary | ICD-10-CM | POA: Diagnosis not present

## 2018-04-25 DIAGNOSIS — I272 Pulmonary hypertension, unspecified: Secondary | ICD-10-CM | POA: Diagnosis present

## 2018-04-25 DIAGNOSIS — I5033 Acute on chronic diastolic (congestive) heart failure: Secondary | ICD-10-CM | POA: Diagnosis present

## 2018-04-25 DIAGNOSIS — E119 Type 2 diabetes mellitus without complications: Secondary | ICD-10-CM | POA: Diagnosis present

## 2018-04-25 DIAGNOSIS — J45909 Unspecified asthma, uncomplicated: Secondary | ICD-10-CM | POA: Diagnosis present

## 2018-04-25 DIAGNOSIS — Z7984 Long term (current) use of oral hypoglycemic drugs: Secondary | ICD-10-CM | POA: Diagnosis not present

## 2018-04-25 DIAGNOSIS — Z79899 Other long term (current) drug therapy: Secondary | ICD-10-CM | POA: Diagnosis not present

## 2018-04-25 DIAGNOSIS — N3944 Nocturnal enuresis: Secondary | ICD-10-CM | POA: Diagnosis present

## 2018-04-25 DIAGNOSIS — E1169 Type 2 diabetes mellitus with other specified complication: Secondary | ICD-10-CM

## 2018-04-25 DIAGNOSIS — I509 Heart failure, unspecified: Secondary | ICD-10-CM

## 2018-04-25 DIAGNOSIS — J9600 Acute respiratory failure, unspecified whether with hypoxia or hypercapnia: Secondary | ICD-10-CM | POA: Diagnosis not present

## 2018-04-25 DIAGNOSIS — Z6841 Body Mass Index (BMI) 40.0 and over, adult: Secondary | ICD-10-CM | POA: Diagnosis not present

## 2018-04-25 DIAGNOSIS — E118 Type 2 diabetes mellitus with unspecified complications: Secondary | ICD-10-CM

## 2018-04-25 DIAGNOSIS — J9622 Acute and chronic respiratory failure with hypercapnia: Secondary | ICD-10-CM | POA: Diagnosis present

## 2018-04-25 DIAGNOSIS — I7300 Raynaud's syndrome without gangrene: Secondary | ICD-10-CM | POA: Diagnosis present

## 2018-04-25 DIAGNOSIS — E662 Morbid (severe) obesity with alveolar hypoventilation: Secondary | ICD-10-CM

## 2018-04-25 DIAGNOSIS — N179 Acute kidney failure, unspecified: Secondary | ICD-10-CM | POA: Diagnosis not present

## 2018-04-25 DIAGNOSIS — Q8711 Prader-Willi syndrome: Secondary | ICD-10-CM

## 2018-04-25 DIAGNOSIS — Z7952 Long term (current) use of systemic steroids: Secondary | ICD-10-CM

## 2018-04-25 DIAGNOSIS — M1A9XX0 Chronic gout, unspecified, without tophus (tophi): Secondary | ICD-10-CM | POA: Diagnosis present

## 2018-04-25 HISTORY — DX: Heart failure, unspecified: I50.9

## 2018-04-25 LAB — I-STAT VENOUS BLOOD GAS, ED
Acid-Base Excess: 11 mmol/L — ABNORMAL HIGH (ref 0.0–2.0)
Bicarbonate: 41.5 mmol/L — ABNORMAL HIGH (ref 20.0–28.0)
O2 SAT: 94 %
PCO2 VEN: 86.8 mmHg — AB (ref 44.0–60.0)
PO2 VEN: 86 mmHg — AB (ref 32.0–45.0)
TCO2: 44 mmol/L — AB (ref 22–32)
pH, Ven: 7.287 (ref 7.250–7.430)

## 2018-04-25 LAB — CBC WITH DIFFERENTIAL/PLATELET
ABS IMMATURE GRANULOCYTES: 0.1 10*3/uL (ref 0.0–0.1)
BASOS ABS: 0.1 10*3/uL (ref 0.0–0.1)
Basophils Relative: 1 %
Eosinophils Absolute: 0.2 10*3/uL (ref 0.0–0.7)
Eosinophils Relative: 2 %
HCT: 42 % (ref 39.0–52.0)
HEMOGLOBIN: 12.3 g/dL — AB (ref 13.0–17.0)
Immature Granulocytes: 1 %
LYMPHS ABS: 1.8 10*3/uL (ref 0.7–4.0)
LYMPHS PCT: 13 %
MCH: 22.1 pg — AB (ref 26.0–34.0)
MCHC: 29.3 g/dL — ABNORMAL LOW (ref 30.0–36.0)
MCV: 75.5 fL — ABNORMAL LOW (ref 78.0–100.0)
MONO ABS: 1 10*3/uL (ref 0.1–1.0)
Monocytes Relative: 8 %
NEUTROS ABS: 10 10*3/uL — AB (ref 1.7–7.7)
Neutrophils Relative %: 75 %
Platelets: 270 10*3/uL (ref 150–400)
RBC: 5.56 MIL/uL (ref 4.22–5.81)
RDW: 19.1 % — ABNORMAL HIGH (ref 11.5–15.5)
WBC: 13.2 10*3/uL — ABNORMAL HIGH (ref 4.0–10.5)

## 2018-04-25 LAB — MRSA PCR SCREENING: MRSA by PCR: NEGATIVE

## 2018-04-25 LAB — COMPREHENSIVE METABOLIC PANEL
ALBUMIN: 3.3 g/dL — AB (ref 3.5–5.0)
ALK PHOS: 160 U/L — AB (ref 38–126)
ALT: 30 U/L (ref 0–44)
AST: 39 U/L (ref 15–41)
Anion gap: 14 (ref 5–15)
BUN: 28 mg/dL — ABNORMAL HIGH (ref 6–20)
CALCIUM: 8.7 mg/dL — AB (ref 8.9–10.3)
CO2: 32 mmol/L (ref 22–32)
CREATININE: 1.11 mg/dL (ref 0.61–1.24)
Chloride: 94 mmol/L — ABNORMAL LOW (ref 98–111)
GFR calc Af Amer: >60 mL/min (ref >60)
GFR calc non Af Amer: >60 mL/min (ref >60)
GLUCOSE: 239 mg/dL — AB (ref 70–99)
Potassium: 3.8 mmol/L (ref 3.5–5.1)
SODIUM: 140 mmol/L (ref 135–145)
Total Bilirubin: 1.5 mg/dL — ABNORMAL HIGH (ref 0.3–1.2)
Total Protein: 7.7 g/dL (ref 6.5–8.1)

## 2018-04-25 LAB — BRAIN NATRIURETIC PEPTIDE: B Natriuretic Peptide: 52.1 pg/mL (ref 0.0–100.0)

## 2018-04-25 LAB — GLUCOSE, CAPILLARY: GLUCOSE-CAPILLARY: 109 mg/dL — AB (ref 70–99)

## 2018-04-25 LAB — TROPONIN I: Troponin I: <0.03 ng/mL (ref <0.03)

## 2018-04-25 LAB — I-STAT CG4 LACTIC ACID, ED: Lactic Acid, Venous: 1.17 mmol/L (ref 0.5–1.9)

## 2018-04-25 LAB — I-STAT TROPONIN, ED: Troponin i, poc: 0 ng/mL (ref 0.00–0.08)

## 2018-04-25 MED ORDER — ACETAMINOPHEN 325 MG PO TABS
650.0000 mg | ORAL_TABLET | Freq: Four times a day (QID) | ORAL | Status: DC | PRN
  Administered 2018-04-26 – 2018-05-03 (×13): 650 mg via ORAL
  Filled 2018-04-25 (×14): qty 2

## 2018-04-25 MED ORDER — METOCLOPRAMIDE HCL 5 MG/ML IJ SOLN
10.0000 mg | Freq: Once | INTRAMUSCULAR | Status: AC
  Administered 2018-04-25: 10 mg via INTRAVENOUS
  Filled 2018-04-25: qty 2

## 2018-04-25 MED ORDER — ENOXAPARIN SODIUM 40 MG/0.4ML ~~LOC~~ SOLN
40.0000 mg | SUBCUTANEOUS | Status: DC
  Administered 2018-04-25 – 2018-04-28 (×4): 40 mg via SUBCUTANEOUS
  Filled 2018-04-25 (×4): qty 0.4

## 2018-04-25 MED ORDER — ACETAMINOPHEN 650 MG RE SUPP
650.0000 mg | Freq: Four times a day (QID) | RECTAL | Status: DC | PRN
  Filled 2018-04-25: qty 1

## 2018-04-25 MED ORDER — FUROSEMIDE 10 MG/ML IJ SOLN
60.0000 mg | Freq: Once | INTRAMUSCULAR | Status: AC
  Administered 2018-04-25: 60 mg via INTRAVENOUS
  Filled 2018-04-25: qty 6

## 2018-04-25 MED ORDER — IOPAMIDOL (ISOVUE-370) INJECTION 76%
100.0000 mL | Freq: Once | INTRAVENOUS | Status: AC | PRN
  Administered 2018-04-25: 100 mL via INTRAVENOUS

## 2018-04-25 MED ORDER — POLYETHYLENE GLYCOL 3350 17 G PO PACK: 17.0000 g | PACK | Freq: Every day | ORAL | Status: DC | PRN

## 2018-04-25 MED ORDER — INSULIN ASPART 100 UNIT/ML ~~LOC~~ SOLN: 0.0000 [IU] | Freq: Three times a day (TID) | SUBCUTANEOUS | Status: DC

## 2018-04-25 MED ORDER — INSULIN ASPART 100 UNIT/ML ~~LOC~~ SOLN
0.0000 [IU] | Freq: Three times a day (TID) | SUBCUTANEOUS | Status: DC
  Administered 2018-04-26 (×2): 3 [IU] via SUBCUTANEOUS
  Administered 2018-04-27 – 2018-04-28 (×2): 2 [IU] via SUBCUTANEOUS

## 2018-04-25 MED ORDER — IOPAMIDOL (ISOVUE-370) INJECTION 76%
INTRAVENOUS | Status: AC
  Filled 2018-04-25: qty 100

## 2018-04-25 NOTE — Progress Notes (Signed)
Placed pt on previously charted settings for home use.

## 2018-04-25 NOTE — H&P (Addendum)
[facility] Admission History and Physical Service Pager: [phone]  Patient name: [name] Medical record number: [id number] Date of birth: 1996-06-07 Age: 22 y.o. Gender: male  Primary Care Provider: Daisy Floro, DO Consultants: None Code Status: Full  Chief Complaint: Shortness of breath  Assessment and Plan: [name] is a 22 y.o. male presenting with shortness of breath secondary to flash pulmonary edema. PMH is significant for Prader-Willi Syndrome, Morbid Obesity, OHS, HTN, Chronic Respiratory Failure, Raynaud's Syndrome, Asthma, T2DM, Chronic Respiratory Failure, and Gout.  Acute respiratory failure 2/2 Flash Pulmonary Edema: Presented with sudden onset shortness of breath and respiratory distress requiring Bipap. CXR acutely with cardiomegaly and pulmonary edema c/w with possible acute exacerbation of HFpEF. BNP 52.1 unreliable because of body habitus. Takes Torsemide 30m daily at home. Differential Diagnosis includes Cardiac Ischemia/Heart Attack given patient had chest pain on admission, but istat trop 0.00 and EKG showing sinus tachycardia without ST changes. The sudden onset is also suspicious for Pulmonary Embolism given tachycardic to 130s but Well's Score low at 1.5. Unlikely hypertensive emergency or aortic dissection as BPs on admission normotensive at 145/96. Aspiration pneumonia on differential as well since patient was at the pool the day prior and has elevated WBC 13.2 but denies submerging head or choking at pool making this unlikely. - Admit to Stepdown, Attending Dr. BOwens Shark- s/p IV lasix 60 x1 in ED, will redose as indicated - continue BiPAP, wean as tolerated. Patient on triology vent at home - I&Os - Daily Weight - CXR 2-View now, could consider CTA Chest overnight if no improvement - Trending troponins x 3 - AM BMP, CBC, EKG  - echocardiogram  Asthma and Allergies: well controlled with home Albuterol nebs, cetirizine  167mdaily, Flovent 11033mBID, Flonase, Singulair 28m61mS. - Hold PO meds d/t BiPAP - Pulse Ox  HTN: Blood Pressures on admission between 115-145/64-96, tachycardic to 130s. Takes Amlodipine 28mg17mly, HCTZ 25mg 1mold PO Meds d/t BiPAP - monitor BPs  T2DM: HbA1c Aug 2019 7.2% down from 7.4%. Controls with Metformin-XR 500mg d74m. - mSSI - monitor CBGs  Gout: takes colchicine 0.6mg and1mlopurinol 100mg dai36m - Hold home PO meds 2/2 BiPAP  Nocturnal enuresis: desmopressin 0.4mg qHS a38mOxybutynin 28mg qHS, 41mold PO Meds 2/2 BiPAP  GERD: Protonix 20mg daily 33mld PO Meds 2/2 BiPAP  FEN/GI: Saline locked, NPO, Miralax Prophylaxis: Lovenox  Disposition: Admit to Stepdown  History of Present Illness:  [name]. male23ith history of Chronic Respiratory Failure on 2L [place], OHS, HFpEF, and Prader-Willi Syndrome presenting with 1 day of progressively and rapidly worsening shortness of breath with chest pain.   He went swimming at the pool the day prior to admission (9/20) and was in his usual state of health. He denies inhaling any of the pool water or putting his head under the water. After getting out of the pool, mom states it took him an hour to get dressed which is not normal for him. At home mom checked his O2 stats and found them in the 80s. She put him on the oxygen concentrator which helped bring the sats back up.   That night he went to bed and slept fine. Mom states he slept longer than usual this morning (9/21) and that she had to go in and wake him up. After getting up he felt tired, so she put him on the oxygen concentrator which  helped. They had planned to run some errands on the day of admission but he was walking much slower than normal, She took him to get his pre-made meals when he said he had chest pain and a fast-beating heart. Mom drove him straight to the hospital.   In the ED the patient was started on Nasal Cannula at 6L but was only satting  around 40%. Oxygen was increased to 10L while prepping the NRB mask without improvement in O2 sats. NRB Mask was used and increased the sats to 88%. Patient was then placed on BiPAP and saturations improved to 95%+ and patient felt less short of breath.    Mom and the patient deny and sick contacts, changes to food or drink, lower extremity swelling (difficult to evaluate due to obesity), cough or fever.   Review Of Systems: Per HPI with the following additions:  Review of Systems  Constitutional: Negative for chills and fever.  Respiratory: Positive for shortness of breath.   Cardiovascular: Negative for chest pain, palpitations and leg swelling.  Gastrointestinal: Negative for constipation, diarrhea, nausea and vomiting.  Genitourinary: Negative for dysuria, frequency and urgency.  Neurological: Positive for headaches.   Patient Active Problem List   Diagnosis Date Noted  . Acute gout of right foot 03/27/2018  . Acute pain of right foot 03/02/2018  . Pneumonia 09/30/2017  . Congestive heart failure ([place])   . Cellulitis of abdominal wall 08/08/2017  . Calculus of gallbladder without cholecystitis without obstruction 05/07/2017  . Diastolic congestive heart failure ([place])   . Tremor bilateral hands 10/27/2014  . Diabetes mellitus type 2 in obese ([place]) 09/27/2014  . Shortness of breath   . Chronic respiratory failure ([place]) 09/15/2014  . Allergic rhinitis 01/07/2013  . Obesity hypoventilation syndrome ([place]) 10/05/2010  . VISUAL IMPAIRMENT 11/13/2009  . RAYNAUD'S SYNDROME 08/10/2009  . Essential hypertension, benign 01/07/2008  . ORTHOSTATIC PROTEINURIA 01/07/2008  . OBESITY, MORBID 02/27/2007  . 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 Asthma 10/16/2006  . SYMPTOM, ENURESIS, NOCTURNAL 10/16/2006   Past Medical History: Past Medical History:  Diagnosis Date  . Asthma   . CHF (congestive heart failure) ([place])   . Diabetes mellitus without complication ([place])   . Hypertension     Pulmomary htn  . Kidney dysfunction    left  . Obesity   . Obesity hypoventilation syndrome ([place])   . Prader-Willi syndrome   . Raynaud's phenomenon   . Sleep apnea    Past Surgical History: Past Surgical History:  Procedure Laterality Date  . NISSEN FUNDOPLICATION    . TONSILLECTOMY     Social History: Social History   Tobacco Use  . Smoking status: Never Smoker  . Smokeless tobacco: Never Used  Substance Use Topics  . Alcohol use: No  . Drug use: No   Additional social history: lives at home with mother.   Please also refer to relevant sections of EMR.  Family History: Family History  Problem Relation Age of Onset  . Migraines Mother    Allergies and Medications: No Known Allergies No current facility-administered medications on file prior to encounter.    Current Outpatient Medications on File Prior to Encounter  Medication Sig Dispense Refill  . ACCU-CHEK AVIVA PLUS test strip as directed 100 each 12  . ACCU-CHEK SOFTCLIX LANCETS lancets Use as instructed 100 each 12  . albuterol (PROVENTIL) (2.5 MG/3ML) 0.083% nebulizer solution inhale contents of 1 vial in nebulizer once daily 75 mL 12  . allopurinol (ZYLOPRIM) 100 MG tablet  Take 1 tablet (100 mg total) by mouth daily. Take EVEN AFTER pain has resolved, this suppresses gout. 30 tablet 6  . amLODipine (NORVASC) 10 MG tablet Take 1 tablet (10 mg total) by mouth daily. 90 tablet 3  . calcium carbonate (TUMS - DOSED IN MG ELEMENTAL CALCIUM) 500 MG chewable tablet Chew 2 tablets (400 mg of elemental calcium total) by mouth 3 (three) times daily between meals as needed for indigestion or heartburn. 60 tablet 3  . cetirizine (ZYRTEC) 10 MG tablet take 1 tablet by mouth once daily 90 tablet 1  . colchicine 0.6 MG tablet Take 1 tablet (0.6 mg total) by mouth daily. 90 tablet 0  . desmopressin (DDAVP) 0.2 MG tablet Take 0.4 mg by mouth at bedtime.     . fluticasone (FLONASE) 50 MCG/ACT nasal spray SHAKE LIQUID AND USE 2  SPRAYS IN EACH NOSTRIL EVERY DAY 16 g 0  . fluticasone (FLOVENT HFA) 110 MCG/ACT inhaler Inhale 2 puffs into the lungs 2 (two) times daily. 1 Inhaler 12  . hydrochlorothiazide (HYDRODIURIL) 25 MG tablet Take 1 tablet (25 mg total) by mouth daily. 90 tablet 3  . Lancets 30G MISC 1 Device by Does not apply route daily before breakfast. 100 each 3  . metFORMIN (GLUCOPHAGE-XR) 500 MG 24 hr tablet TAKE 1 TABLET BY MOUTH EVERY DAY WITH BREAKFAST 90 tablet 0  . Misc. Devices MISC Please provide a pulse oximeter for the ear lobe. Patient has diagnosis of Raynaud, asthma 1 each 0  . montelukast (SINGULAIR) 10 MG tablet Take 1 tablet (10 mg total) by mouth at bedtime. 90 tablet 3  . olopatadine (PATANOL) 0.1 % ophthalmic solution Place 1 drop into both eyes 2 (two) times daily as needed for allergies.  0  . oxybutynin (DITROPAN) 5 MG tablet TAKE 2 TABLETS BY MOUTH AT BEDTIME 60 tablet 0  . pantoprazole (PROTONIX) 20 MG tablet Take 1 tablet (20 mg total) by mouth daily. 90 tablet 0  . predniSONE (DELTASONE) 20 MG tablet Take 2 tablets (40 mg total) by mouth daily with breakfast. 7 tablet 0  . PREVIDENT 5000 ENAMEL PROTECT 1.1-5 % PSTE Take 1 application by mouth 2 (two) times daily.   0  . PROAIR HFA 108 (90 Base) MCG/ACT inhaler inhale 2 puff by mouth every 6 hours if needed 8.5 Inhaler 0  . Respiratory Therapy Supplies (NEBULIZER/ADULT MASK) KIT 1 Device by Does not apply route once. 1 each 0  . Respiratory Therapy Supplies MISC BiPAP IPAP:20, EPAP: 8, back up respiratory rate: 10, FIO2: 35%. 1 each 0  . Spacer/Aero-Holding Chambers (AEROCHAMBER MV) inhaler Use with albuterol inhaler 1 each 1  . torsemide (DEMADEX) 20 MG tablet TAKE 3 TABLETS BY MOUTH ONCE DAILY 90 tablet 2   Objective: BP (!) 145/96   Pulse (!) 118   Temp 99.7 F (37.6 C) (Axillary)   Resp (!) 26   Ht '4\' 7"'  (1.397 m)   Wt (!) 163.3 kg   SpO2 97%   BMI 83.67 kg/m   Physical Exam  Constitutional: He appears well-developed and  well-nourished. He appears distressed.  Obese, alert and interactive  HENT:  Head: Normocephalic and atraumatic.  Eyes: Conjunctivae and EOM are normal. No scleral icterus.  Neck: Normal range of motion. Neck supple. No thyromegaly present.  Unable to appreciate JVD due to enlarged neck circumference  Cardiovascular: Regular rhythm.  Tachycardic, distant heart sounds  Pulmonary/Chest: He is in respiratory distress. He has no wheezes.  Abdominal: Soft. Bowel  sounds are normal. There is no tenderness.  Obese; small 0.5cm scab to RUQ with minimal surrounding erythema from previous infection; clean, healing  Musculoskeletal:  Large extremities with reduced ROM 2/2 obesity; otherwise without deformity  Lymphadenopathy:    He has no cervical adenopathy.  Neurological: He is alert.  Skin: Skin is warm and dry. Capillary refill takes less than 2 seconds.  Psychiatric:  Cognitively functions at level of 29-66 year old   Labs and Imaging: CBC BMET  Recent Labs  Lab 04/25/18 1302  WBC 13.2*  HGB 12.3*  HCT 42.0  PLT 270   Recent Labs  Lab 04/25/18 1302  NA 140  K 3.8  CL 94*  CO2 32  BUN 28*  CREATININE 1.11  GLUCOSE 239*  CALCIUM 8.7*       BNP    Component Value Date/Time   BNP 52.1 04/25/2018 1302    Troponin (Point of Care Test) Recent Labs    04/25/18 1306  TROPIPOC 0.00    Dg Chest Port 1 View  Result Date: 04/25/2018 CLINICAL DATA:  Shortness of breath. EXAM: PORTABLE CHEST 1 VIEW COMPARISON:  09/30/2017. FINDINGS: 1349 hours. The cardio pericardial silhouette is enlarged. Vascular congestion with pulmonary edema pattern bilaterally. The visualized bony structures of the thorax are intact. Telemetry leads overlie the chest. IMPRESSION: Cardiomegaly with pulmonary edema. Electronically Signed   By: Misty Stanley M.D.   On: 04/25/2018 14:20    Milus Banister, [place], PGY-1 04/25/2018 5:03 PM   FPTS Upper-Level Resident Addendum  I  have independently interviewed and examined the patient. I have discussed the above with the original author and agree with their documentation. My edits for correction/addition/clarification are in blue. Please see also any attending notes.   Bufford Lope, DO PGY-3, [facility] 04/25/2018 6:50 PM  FPTS Service pager: [phone] (text pages welcome through [place])

## 2018-04-25 NOTE — Progress Notes (Signed)
Pt transported to CT and back without complications. 

## 2018-04-25 NOTE — ED Notes (Signed)
Pt was 40% on Nasal cannula at 6L, O2 increased to 10L with no improvement while setting up for NRB mask. Pt placed on NRB, sats increased to 88% and pt taken back to room.

## 2018-04-25 NOTE — Progress Notes (Signed)
Patient was transferred to 2W04 without incident. Report called to Meredith Pelimolyn Cole,  RRT

## 2018-04-25 NOTE — ED Provider Notes (Signed)
[place] EMERGENCY DEPARTMENT Provider Note   CSN: [id number] Arrival date & time: 04/25/18  1241     History   Chief Complaint Chief Complaint  Patient presents with  . Respiratory Distress    HPI [name] is a 22 y.o. male.  Patient is a 22 year old male with a history of Prader-Willi syndrome, severe morbid obesity, diabetes, CHF, asthma, pulmonary hypertension who is presenting with mom today for acute respiratory distress.  Mom states that yesterday he seemed short of breath when he went to the pool like he normally does to the [facility].  When she got to him his oxygen saturation was approximately 80% but she then put his oxygen on the concentrator and his sats resolved and he appeared to be okay.  This morning they were planning on going somewhere but it took him quite a while to walk out to the car when he got to the car he was having severe shortness of breath.  She drove him immediately here.  Upon arrival here patient was having oxygen saturations in the 50s and was in distress.  He is complaining of being short of breath.  Mom states he developed a cough today but has not been aware of him having any fever.  He has not had any nausea or vomiting.  She has not noted any new swelling but he does not get regular weights.  He does take 60 of torsemide daily and has not missed any doses.  She also states the kitchen is locked and he gets very strict diet with no salt.  He does use the trilogy at night and oxygen during the day and has done that without missing any.  The history is provided by a parent.    Past Medical History:  Diagnosis Date  . Asthma   . CHF (congestive heart failure) ([place])   . Diabetes mellitus without complication ([place])   . Hypertension    Pulmomary htn  . Kidney dysfunction    left  . Obesity   . Obesity hypoventilation syndrome ([place])   . Prader-Willi syndrome   . Raynaud's phenomenon   . Sleep apnea     Patient Active Problem  List   Diagnosis Date Noted  . Acute gout of right foot 03/27/2018  . Acute pain of right foot 03/02/2018  . Pneumonia 09/30/2017  . Congestive heart failure ([place])   . Cellulitis of abdominal wall 08/08/2017  . Calculus of gallbladder without cholecystitis without obstruction 05/07/2017  . Diastolic congestive heart failure ([place])   . Tremor bilateral hands 10/27/2014  . Diabetes mellitus type 2 in obese ([place]) 09/27/2014  . Shortness of breath   . Chronic respiratory failure ([place]) 09/15/2014  . Allergic rhinitis 01/07/2013  . Obesity hypoventilation syndrome ([place]) 10/05/2010  . VISUAL IMPAIRMENT 11/13/2009  . RAYNAUD'S SYNDROME 08/10/2009  . Essential hypertension, benign 01/07/2008  . ORTHOSTATIC PROTEINURIA 01/07/2008  . OBESITY, MORBID 02/27/2007  . 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 Asthma 10/16/2006  . SYMPTOM, ENURESIS, NOCTURNAL 10/16/2006    Past Surgical History:  Procedure Laterality Date  . NISSEN FUNDOPLICATION    . TONSILLECTOMY          Home Medications    Prior to Admission medications   Medication Sig Start Date End Date Taking? Authorizing Provider  ACCU-CHEK AVIVA PLUS test strip as directed 11/02/15   Leone Haven, MD  ACCU-CHEK [facility] LANCETS lancets Use as instructed 11/02/15   Melancon, York Ram, MD  albuterol ([place]) (  2.5 MG/3ML) 0.083% nebulizer solution inhale contents of 1 vial in nebulizer once daily 01/01/18   Mayo, Pete Pelt, MD  allopurinol (ZYLOPRIM) 100 MG tablet Take 1 tablet (100 mg total) by mouth daily. Take EVEN AFTER pain has resolved, this suppresses gout. 03/26/18   Sela Hilding, MD  amLODipine (NORVASC) 10 MG tablet Take 1 tablet (10 mg total) by mouth daily. 03/11/18   Bufford Lope, DO  calcium carbonate (TUMS - DOSED IN MG ELEMENTAL CALCIUM) 500 MG chewable tablet Chew 2 tablets (400 mg of elemental calcium total) by mouth 3 (three) times daily between meals as needed for indigestion or heartburn. 04/30/17   Sela Hilding, MD  cetirizine (ZYRTEC) 10 MG tablet take 1 tablet by mouth once daily 09/23/17   Mayo, Pete Pelt, MD  colchicine 0.6 MG tablet Take 1 tablet (0.6 mg total) by mouth daily. 03/26/18   Sela Hilding, MD  desmopressin (DDAVP) 0.2 MG tablet Take 0.4 mg by mouth at bedtime.     [provider]  fluticasone (FLONASE) 50 MCG/ACT nasal spray SHAKE LIQUID AND USE 2 SPRAYS IN EACH NOSTRIL EVERY DAY 04/24/18   Milus Banister C, DO  fluticasone (FLOVENT HFA) 110 MCG/ACT inhaler Inhale 2 puffs into the lungs 2 (two) times daily. 04/16/17   Mayo, Pete Pelt, MD  hydrochlorothiazide (HYDRODIURIL) 25 MG tablet Take 1 tablet (25 mg total) by mouth daily. 03/19/18   Daisy Floro, DO  Lancets 30G MISC 1 Device by Does not apply route daily before breakfast. 10/06/14   Leone Haven, MD  metFORMIN (GLUCOPHAGE-XR) 500 MG 24 hr tablet TAKE 1 TABLET BY MOUTH EVERY DAY WITH BREAKFAST 02/16/18   Daisy Floro, DO  Misc. Devices MISC Please provide a pulse oximeter for the ear lobe. Patient has diagnosis of Raynaud, asthma 09/16/11   Judithann Sheen, MD  montelukast (SINGULAIR) 10 MG tablet Take 1 tablet (10 mg total) by mouth at bedtime. 04/16/17   Mayo, Pete Pelt, MD  olopatadine (PATANOL) 0.1 % ophthalmic solution Place 1 drop into both eyes 2 (two) times daily as needed for allergies. 01/20/17   [provider]  oxybutynin (DITROPAN) 5 MG tablet TAKE 2 TABLETS BY MOUTH AT BEDTIME 11/19/17   Mayo, Pete Pelt, MD  pantoprazole (PROTONIX) 20 MG tablet Take 1 tablet (20 mg total) by mouth daily. 02/16/18   Daisy Floro, DO  predniSONE (DELTASONE) 20 MG tablet Take 2 tablets (40 mg total) by mouth daily with breakfast. 03/25/18   Bufford Lope, DO  PREVIDENT 5000 ENAMEL PROTECT 1.1-5 % PSTE Take 1 application by mouth 2 (two) times daily.  01/14/17   [provider]  PROAIR HFA 108 (90 Base) MCG/ACT inhaler inhale 2 puff by mouth every 6 hours if needed 05/09/16   Collene Gobble, MD  Respiratory Therapy Supplies (NEBULIZER/ADULT MASK) KIT 1 Device by Does not apply route once. 05/20/14   Leone Haven, MD  Respiratory Therapy Supplies MISC BiPAP IPAP:20, EPAP: 8, back up respiratory rate: 10, FIO2: 35%. 04/30/17   Sela Hilding, MD  Spacer/Aero-Holding Chambers (AEROCHAMBER MV) inhaler Use with albuterol inhaler 04/20/12   Verdie Drown, Samuel Germany, MD  torsemide (DEMADEX) 20 MG tablet TAKE 3 TABLETS BY MOUTH ONCE DAILY 03/16/18   Collene Gobble, MD    Family History Family History  Problem Relation Age of Onset  . Migraines Mother     Social History Social History   Tobacco Use  . Smoking status:  Never Smoker  . Smokeless tobacco: Never Used  Substance Use Topics  . Alcohol use: No  . Drug use: No     Allergies   Patient has no known allergies.   Review of Systems Review of Systems  All other systems reviewed and are negative.    Physical Exam Updated Vital Signs BP 132/64 (BP Location: Right Arm)   Pulse (!) 131   Temp 99.7 F (37.6 C) (Axillary)   Resp (!) 30   Ht _0  (1.397 m)   Wt (!) 163.3 kg   SpO2 96%   BMI 83.67 kg/m   Physical Exam  Constitutional: He is oriented to person, place, and time. He appears well-developed and well-nourished. He appears distressed.  Severe morbid obesity  HENT:  Head: Normocephalic and atraumatic.  Mouth/Throat: Oropharynx is clear and moist.  Eyes: Pupils are equal, round, and reactive to light. Conjunctivae and EOM are normal.  Neck: Normal range of motion. Neck supple.  Cardiovascular: Regular rhythm and intact distal pulses. Tachycardia present.  No murmur heard. Pulmonary/Chest: Effort normal and breath sounds normal. No respiratory distress. He has no wheezes. He has no rales.  Abdominal: Soft. He exhibits no distension. There is no tenderness. There is no rebound and no guarding.  Musculoskeletal: Normal range of motion. He exhibits no edema or tenderness.  Neurological: He is alert  and oriented to person, place, and time.  Skin: Skin is warm and dry. No rash noted. No erythema.  Psychiatric: He has a normal mood and affect. His behavior is normal.  Nursing note and vitals reviewed.    ED Treatments / Results  Labs (all labs ordered are listed, but only abnormal results are displayed) Labs Reviewed  CBC WITH DIFFERENTIAL/PLATELET - Abnormal; Notable for the following components:      Result Value   WBC 13.2 (*)    Hemoglobin 12.3 (*)    MCV 75.5 (*)    MCH 22.1 (*)    MCHC 29.3 (*)    RDW 19.1 (*)    Neutro Abs 10.0 (*)    All other components within normal limits  COMPREHENSIVE METABOLIC PANEL - Abnormal; Notable for the following components:   Chloride 94 (*)    Glucose, Bld 239 (*)    BUN 28 (*)    Calcium 8.7 (*)    Albumin 3.3 (*)    Alkaline Phosphatase 160 (*)    Total Bilirubin 1.5 (*)    All other components within normal limits  I-STAT VENOUS BLOOD GAS, ED - Abnormal; Notable for the following components:   pCO2, Ven 86.8 (*)    pO2, Ven 86.0 (*)    Bicarbonate 41.5 (*)    TCO2 44 (*)    Acid-Base Excess 11.0 (*)    All other components within normal limits  CULTURE, BLOOD (ROUTINE X 2)  CULTURE, BLOOD (ROUTINE X 2)  BRAIN NATRIURETIC PEPTIDE  I-STAT TROPONIN, ED  I-STAT CG4 LACTIC ACID, ED    EKG EKG Interpretation  Date/Time:  Saturday April 25 2018 13:13:03 EDT Ventricular Rate:  130 PR Interval:    QRS Duration: 66 QT Interval:  285 QTC Calculation: 420 R Axis:   73 Text Interpretation:  Sinus tachycardia Consider right atrial enlargement Low voltage, precordial leads Borderline T abnormalities, inferior leads No significant change since last tracing Confirmed by Blanchie Dessert ([id number]) on 04/25/2018 1:17:26 PM   Radiology Dg Chest Port 1 View  Result Date: 04/25/2018 CLINICAL DATA:  Shortness of breath. EXAM: PORTABLE  CHEST 1 VIEW COMPARISON:  09/30/2017. FINDINGS: 1349 hours. The cardio pericardial silhouette is  enlarged. Vascular congestion with pulmonary edema pattern bilaterally. The visualized bony structures of the thorax are intact. Telemetry leads overlie the chest. IMPRESSION: Cardiomegaly with pulmonary edema. Electronically Signed   By: Misty Stanley M.D.   On: 04/25/2018 14:20    Procedures Procedures (including critical care time)  Medications Ordered in ED Medications - No data to display   Initial Impression / Assessment and Plan / ED Course  I have reviewed the triage vital signs and the nursing notes.  Pertinent labs & imaging results that were available during my care of the patient were reviewed by me and considered in my medical decision making (see chart for details).     22 year old male with developmental delay who is presenting today with abrupt onset of shortness of breath and concern for flash pulmonary edema.  Upon arrival here patient's oxygen saturation was in the 50s with tachypnea and rales.  Patient takes his diuretic regularly and has not been eating salty foods.  Mom states the cough today that is been nonproductive but no prior symptoms.  No fever that she is aware of.  Upon arrival here patient was immediately placed on BiPAP with improvement of oxygen saturations.  Patient's tachycardia and tachypnea have slowly improved.  Patient's temperature is 99.7 here.  EKG is unchanged.  Patient's labs are consistent with respiratory acidosis with a CO2 of 86 which is not terribly different than when he was here in January.  Patient's chest x-ray shows signs of fluid overload today and renal function is stable.  Patient given IV Lasix.  Will admit to a stepdown bed.  CRITICAL CARE Performed by: Cataleyah Colborn Total critical care time: 30 minutes Critical care time was exclusive of separately billable procedures and treating other patients. Critical care was necessary to treat or prevent imminent or life-threatening deterioration. Critical care was time spent personally by  me on the following activities: development of treatment plan with patient and/or surrogate as well as nursing, discussions with consultants, evaluation of patient's response to treatment, examination of patient, obtaining history from patient or surrogate, ordering and performing treatments and interventions, ordering and review of laboratory studies, ordering and review of radiographic studies, pulse oximetry and re-evaluation of patient's condition.   Final Clinical Impressions(s) / ED Diagnoses   Final diagnoses:  Acute on chronic congestive heart failure, unspecified heart failure type ([place])  Flash pulmonary edema [facility])    ED Discharge Orders    None       Blanchie Dessert, MD 04/25/18 1513

## 2018-04-26 ENCOUNTER — Inpatient Hospital Stay (HOSPITAL_COMMUNITY): Payer: Medicaid Other

## 2018-04-26 DIAGNOSIS — J45909 Unspecified asthma, uncomplicated: Secondary | ICD-10-CM

## 2018-04-26 DIAGNOSIS — J181 Lobar pneumonia, unspecified organism: Secondary | ICD-10-CM

## 2018-04-26 DIAGNOSIS — I509 Heart failure, unspecified: Secondary | ICD-10-CM

## 2018-04-26 LAB — GLUCOSE, CAPILLARY
GLUCOSE-CAPILLARY: 154 mg/dL — AB (ref 70–99)
GLUCOSE-CAPILLARY: 102 mg/dL — AB (ref 70–99)
Glucose-Capillary: 108 mg/dL — ABNORMAL HIGH (ref 70–99)
Glucose-Capillary: 165 mg/dL — ABNORMAL HIGH (ref 70–99)

## 2018-04-26 LAB — ECHOCARDIOGRAM COMPLETE
Height: 55 in
Weight: 5760 oz

## 2018-04-26 LAB — BASIC METABOLIC PANEL
Anion gap: 17 — ABNORMAL HIGH (ref 5–15)
BUN: 26 mg/dL — AB (ref 6–20)
CHLORIDE: 100 mmol/L (ref 98–111)
CO2: 28 mmol/L (ref 22–32)
Calcium: 8.8 mg/dL — ABNORMAL LOW (ref 8.9–10.3)
Creatinine, Ser: 1.01 mg/dL (ref 0.61–1.24)
GFR calc Af Amer: >60 mL/min (ref >60)
GFR calc non Af Amer: >60 mL/min (ref >60)
GLUCOSE: 110 mg/dL — AB (ref 70–99)
POTASSIUM: 4.2 mmol/L (ref 3.5–5.1)
Sodium: 145 mmol/L (ref 135–145)

## 2018-04-26 LAB — CBC
HCT: 37.5 % — ABNORMAL LOW (ref 39.0–52.0)
HEMOGLOBIN: 10.8 g/dL — AB (ref 13.0–17.0)
MCH: 21.9 pg — AB (ref 26.0–34.0)
MCHC: 28.8 g/dL — ABNORMAL LOW (ref 30.0–36.0)
MCV: 75.9 fL — AB (ref 78.0–100.0)
Platelets: 235 10*3/uL (ref 150–400)
RBC: 4.94 MIL/uL (ref 4.22–5.81)
RDW: 18.7 % — ABNORMAL HIGH (ref 11.5–15.5)
WBC: 8.6 10*3/uL (ref 4.0–10.5)

## 2018-04-26 LAB — TROPONIN I: Troponin I: <0.03 ng/mL (ref <0.03)

## 2018-04-26 LAB — HIV ANTIBODY (ROUTINE TESTING W REFLEX): HIV Screen 4th Generation wRfx: NONREACTIVE

## 2018-04-26 LAB — BLOOD GAS, VENOUS
Acid-Base Excess: 14.1 mmol/L — ABNORMAL HIGH (ref 0.0–2.0)
Bicarbonate: 40.3 mmol/L — ABNORMAL HIGH (ref 20.0–28.0)
O2 Saturation: 87.6 %
PCO2 VEN: 75 mmHg — AB (ref 44.0–60.0)
PO2 VEN: 56.7 mmHg — AB (ref 32.0–45.0)
Patient temperature: 98.6
pH, Ven: 7.349 (ref 7.250–7.430)

## 2018-04-26 LAB — STREP PNEUMONIAE URINARY ANTIGEN: Strep Pneumo Urinary Antigen: NEGATIVE

## 2018-04-26 LAB — C-REACTIVE PROTEIN: CRP: 16.5 mg/dL — ABNORMAL HIGH (ref <1.0)

## 2018-04-26 LAB — PROCALCITONIN: PROCALCITONIN: 0.19 ng/mL

## 2018-04-26 MED ORDER — LEVOFLOXACIN IN D5W 750 MG/150ML IV SOLN: 750.0000 mg | INTRAVENOUS | Status: DC

## 2018-04-26 MED ORDER — LORATADINE 10 MG PO TABS
10.0000 mg | ORAL_TABLET | Freq: Every day | ORAL | Status: DC
  Administered 2018-04-27 – 2018-05-03 (×7): 10 mg via ORAL
  Filled 2018-04-26 (×8): qty 1

## 2018-04-26 MED ORDER — FUROSEMIDE 10 MG/ML IJ SOLN
60.0000 mg | Freq: Once | INTRAMUSCULAR | Status: AC
  Administered 2018-04-26: 60 mg via INTRAVENOUS
  Filled 2018-04-26: qty 6

## 2018-04-26 MED ORDER — SODIUM CHLORIDE 0.9 % IV SOLN
1.0000 g | INTRAVENOUS | Status: AC
  Administered 2018-04-26 – 2018-04-30 (×5): 1 g via INTRAVENOUS
  Filled 2018-04-26 (×6): qty 10

## 2018-04-26 MED ORDER — DESMOPRESSIN ACETATE 0.2 MG PO TABS
0.4000 mg | ORAL_TABLET | Freq: Every day | ORAL | Status: DC
  Administered 2018-04-26 – 2018-04-30 (×4): 0.4 mg via ORAL
  Filled 2018-04-26 (×7): qty 2

## 2018-04-26 MED ORDER — FLUTICASONE PROPIONATE 50 MCG/ACT NA SUSP
2.0000 | Freq: Every day | NASAL | Status: DC
  Administered 2018-04-27 – 2018-05-03 (×6): 2 via NASAL
  Filled 2018-04-26 (×2): qty 16

## 2018-04-26 MED ORDER — OXYBUTYNIN CHLORIDE 5 MG PO TABS
10.0000 mg | ORAL_TABLET | Freq: Every day | ORAL | Status: DC
  Administered 2018-04-26: 10 mg via ORAL
  Filled 2018-04-26: qty 2

## 2018-04-26 MED ORDER — PERFLUTREN LIPID MICROSPHERE
1.0000 mL | INTRAVENOUS | Status: AC | PRN
  Administered 2018-04-26: 3 mL via INTRAVENOUS
  Filled 2018-04-26: qty 10

## 2018-04-26 MED ORDER — IPRATROPIUM-ALBUTEROL 0.5-2.5 (3) MG/3ML IN SOLN: 3.0000 mL | RESPIRATORY_TRACT | Status: DC | PRN

## 2018-04-26 MED ORDER — PANTOPRAZOLE SODIUM 20 MG PO TBEC
20.0000 mg | DELAYED_RELEASE_TABLET | Freq: Every day | ORAL | Status: DC
  Administered 2018-04-27 – 2018-05-03 (×7): 20 mg via ORAL
  Filled 2018-04-26 (×8): qty 1

## 2018-04-26 MED ORDER — BUDESONIDE 0.25 MG/2ML IN SUSP
0.2500 mg | Freq: Two times a day (BID) | RESPIRATORY_TRACT | Status: DC
  Administered 2018-04-26 – 2018-05-03 (×15): 0.25 mg via RESPIRATORY_TRACT
  Filled 2018-04-26 (×15): qty 2

## 2018-04-26 MED ORDER — INFLUENZA VAC SPLIT QUAD 0.5 ML IM SUSY
0.5000 mL | PREFILLED_SYRINGE | INTRAMUSCULAR | Status: DC
  Filled 2018-04-26: qty 0.5

## 2018-04-26 MED ORDER — SODIUM CHLORIDE 0.9 % IV SOLN
500.0000 mg | INTRAVENOUS | Status: AC
  Administered 2018-04-26 – 2018-04-28 (×3): 500 mg via INTRAVENOUS
  Filled 2018-04-26 (×3): qty 500

## 2018-04-26 MED ORDER — AMLODIPINE BESYLATE 10 MG PO TABS
10.0000 mg | ORAL_TABLET | Freq: Every day | ORAL | Status: DC
  Administered 2018-04-27 – 2018-05-03 (×7): 10 mg via ORAL
  Filled 2018-04-26 (×9): qty 1

## 2018-04-26 MED ORDER — MONTELUKAST SODIUM 10 MG PO TABS
10.0000 mg | ORAL_TABLET | Freq: Every day | ORAL | Status: DC
  Administered 2018-04-26 – 2018-04-30 (×4): 10 mg via ORAL
  Filled 2018-04-26 (×5): qty 1

## 2018-04-26 NOTE — Progress Notes (Signed)
Mom recollects patient having some difficulty with medications in the past  Documentation and records revealed that he was given Levaquin and prednisone Unclear what caused his confusion  I will switch from Levaquin to Rocephin and azithromycin  It is likely that confusion was caused by steroids rather than the antibiotic

## 2018-04-26 NOTE — Progress Notes (Signed)
  Echocardiogram 2D Echocardiogram has been performed.  [name] G [name] 04/26/2018, 3:52 PM

## 2018-04-26 NOTE — Progress Notes (Signed)
The patient was placed back on the Trilogy by his mom at 1250.

## 2018-04-26 NOTE — Consult Note (Signed)
NAME:  [name], MRN:  [id number], DOB:  12-21-1995, LOS: 1 ADMISSION DATE:  04/25/2018, CONSULTATION DATE: 04/26/2018 REFERRING MD: Dr. Owens Shark, CM. CHIEF COMPLAINT: Shortness of breath  Brief History   22 year old gentleman who presented to the emergency department with worsening shortness of breath over days duration History of multiple comorbidities including Prader-Willi syndrome for which he is on a trilogy vent Morbid obesity, diabetes, congestive heart failure 1 day history of worsening shortness of breath, desaturations Admits to a cough, sputum production  Significant Hospital Events   Requiring BiPAP initially Now on his trilogy ventilator  Consults: date of consult/date signed off & final recs:    Significant Diagnostic Tests:  CT scan of the chest-reviewed by myself-multifocal infiltrates, bilateral, wrse on the right  Micro Data: MRSA by PCR negative Blood cultures 04/25/2018-negative to date  Antimicrobials:  None  Subjective:  Patient currently on BiPAP Mother reports that he feels better, concerned about the cough and sputum production Objective   Blood pressure 130/86, pulse (!) 105, temperature 99 F (37.2 C), temperature source Oral, resp. rate (!) 34, height _0  (1.397 m), weight (!) 163.3 kg, SpO2 92 %.    FiO2 (%):  [50 %] 50 %   Intake/Output Summary (Last 24 hours) at 04/26/2018 1419 Last data filed at 04/26/2018 1200 Gross per 24 hour  Intake -  Output 3350 ml  Net -3350 ml   Filed Weights   04/25/18 1304  Weight: (!) 163.3 kg    Examination: General: Young gentleman, morbidly obese HENT: Neck is supple Lungs: Clear breath sounds anteriorly, difficult to fully appreciate breath sounds secondary to his body habitus Cardiovascular: S1-S2 appreciated Abdomen: Obese, nontender Extremities: warm and dry Neuro: Moving all extremities, following commands GU: Good output with diuretics  Resolved Hospital Problem list   Shortness of  breath a bit better  Assessment & Plan:   Acute on chronic hypoxemic respiratory failure Patient with acute worsening of his respiratory status Associated oxygen desaturations, severe shortness of breath Associated cough, sputum production This is slightly improved Acute pulmonary edema or lower respiratory infection are both possibilities He is currently being diuresed  Abnormal CT scan of the chest showing multifocal infiltrate This could be on account of pneumonia Pulmonary edema is also possibility-atypical distribution Continue diuresis  Possibility of pneumonia Will cover with antibiotic I will start him on Levaquin 750 IV Obtain procalcitonin, CRP Urine for Legionella and pneumococcal antigen   History of asthma Continue bronchodilators PRN  Prader-Willi syndrome Continue ventilator support On trilogy ventilator  Morbid obesity Continue support  Type 2 diabetes SSI  Disposition / Summary of Today's Plan 04/26/18   Goal is to optimize respiratory status    Diet: As tolerated DVT prophylaxis: Lovenox GI prophylaxis: Continue Protonix Mobility: As tolerated Code Status: Full code Family Communication: Discussed with patient's mother at bedside  Labs   CBC: Recent Labs  Lab 04/25/18 1302 04/26/18 0619  WBC 13.2* 8.6  NEUTROABS 10.0*  --   HGB 12.3* 10.8*  HCT 42.0 37.5*  MCV 75.5* 75.9*  PLT 270 502   Basic Metabolic Panel: Recent Labs  Lab 04/25/18 1302 04/26/18 0450  NA 140 145  K 3.8 4.2  CL 94* 100  CO2 32 28  GLUCOSE 239* 110*  BUN 28* 26*  CREATININE 1.11 1.01  CALCIUM 8.7* 8.8*   GFR: Estimated Creatinine Clearance: 141 mL/min (by C-G formula based on SCr of 1.01 mg/dL). Recent Labs  Lab 04/25/18 1302 04/25/18 1312 04/26/18  0619  WBC 13.2*  --  8.6  LATICACIDVEN  --  1.17  --    Liver Function Tests: Recent Labs  Lab 04/25/18 1302  AST 39  ALT 30  ALKPHOS 160*  BILITOT 1.5*  PROT 7.7  ALBUMIN 3.3*   No results  for input(s): LIPASE, AMYLASE in the last 168 hours. No results for input(s): AMMONIA in the last 168 hours. ABG    Component Value Date/Time   PHART 7.311 (L) 08/29/2017 0641   PCO2ART 72.0 (HH) 08/29/2017 0641   PO2ART 102 08/29/2017 0641   HCO3 41.5 (H) 04/25/2018 1309   TCO2 44 (H) 04/25/2018 1309   O2SAT 94.0 04/25/2018 1309    Coagulation Profile: No results for input(s): INR, PROTIME in the last 168 hours. Cardiac Enzymes: Recent Labs  Lab 04/25/18 1905 04/26/18 0450 04/26/18 1022  TROPONINI <0.03 <0.03 <0.03   HbA1C: HbA1c, POC (controlled diabetic range)  Date/Time Value Ref Range Status  03/10/2018 02:10 PM 7.2 (A) 0.0 - 7.0 % Final   Hgb A1c MFr Bld  Date/Time Value Ref Range Status  08/28/2017 06:48 PM 7.4 (H) 4.8 - 5.6 % Final    Comment:    (NOTE) Pre diabetes:          5.7%-6.4% Diabetes:              >6.4% Glycemic control for   <7.0% adults with diabetes   02/27/2017 07:32 AM 6.6 (H) 4.8 - 5.6 % Final    Comment:    (NOTE)         Pre-diabetes: 5.7 - 6.4         Diabetes: >6.4         Glycemic control for adults with diabetes: <7.0    CBG: Recent Labs  Lab 04/25/18 2022 04/26/18 0704 04/26/18 1114  GLUCAP 109* 108* 165*     Review of Systems:   Review of Systems  Constitutional: Negative for chills and fever.  Respiratory: Positive for cough and shortness of breath.   Gastrointestinal: Negative for abdominal pain.    Past medical history  He,  has a past medical history of Asthma, CHF (congestive heart failure) ([place]), Diabetes mellitus without complication ([place]), Hypertension, Kidney dysfunction, Obesity, Obesity hypoventilation syndrome ([place]), Prader-Willi syndrome, Raynaud's phenomenon, and Sleep apnea.   Surgical History    Past Surgical History:  Procedure Laterality Date  . NISSEN FUNDOPLICATION    . TONSILLECTOMY       Social History   Social History   Socioeconomic History  . Marital status: Single    Spouse name:  Not on file  . Number of children: Not on file  . Years of education: Not on file  . Highest education level: Not on file  Occupational History  . Not on file  Social Needs  . Financial resource strain: Not on file  . Food insecurity:    Worry: Not on file    Inability: Not on file  . Transportation needs:    Medical: Not on file    Non-medical: Not on file  Tobacco Use  . Smoking status: Never Smoker  . Smokeless tobacco: Never Used  Substance and Sexual Activity  . Alcohol use: No  . Drug use: No  . Sexual activity: Never    Birth control/protection: Abstinence  Lifestyle  . Physical activity:    Days per week: Not on file    Minutes per session: Not on file  . Stress: Not on file  Relationships  .  Social connections:    Talks on phone: Not on file    Gets together: Not on file    Attends religious service: Not on file    Active member of club or organization: Not on file    Attends meetings of clubs or organizations: Not on file    Relationship status: Not on file  . Intimate partner violence:    Fear of current or ex partner: Not on file    Emotionally abused: Not on file    Physically abused: Not on file    Forced sexual activity: Not on file  Other Topics Concern  . Not on file  Social History Narrative   Lives with mom, very supportive, Doroteo Bradford and brother.  HS education.  Has Prader Willi syndrome.   ,  reports that he has never smoked. He has never used smokeless tobacco. He reports that he does not drink alcohol or use drugs.   Family history   His family history includes Migraines in his mother.   Allergies No Known Allergies  Home meds  Prior to Admission medications   Medication Sig Start Date End Date Taking? Authorizing Provider  albuterol (PROVENTIL) (2.5 MG/3ML) 0.083% nebulizer solution inhale contents of 1 vial in nebulizer once daily Patient taking differently: Take 2.5 mg by nebulization daily.  01/01/18  Yes Mayo, Pete Pelt, MD  allopurinol  (ZYLOPRIM) 100 MG tablet Take 1 tablet (100 mg total) by mouth daily. Take EVEN AFTER pain has resolved, this suppresses gout. Patient taking differently: Take 100 mg by mouth daily.  03/26/18  Yes Sela Hilding, MD  amLODipine (NORVASC) 10 MG tablet Take 1 tablet (10 mg total) by mouth daily. 03/11/18  Yes Bufford Lope, DO  calcium carbonate (TUMS - DOSED IN MG ELEMENTAL CALCIUM) 500 MG chewable tablet Chew 2 tablets (400 mg of elemental calcium total) by mouth 3 (three) times daily between meals as needed for indigestion or heartburn. 04/30/17  Yes Sela Hilding, MD  cetirizine (ZYRTEC) 10 MG tablet take 1 tablet by mouth once daily Patient taking differently: Take 10 mg by mouth daily.  09/23/17  Yes Mayo, Pete Pelt, MD  colchicine 0.6 MG tablet Take 1 tablet (0.6 mg total) by mouth daily. 03/26/18  Yes Sela Hilding, MD  desmopressin (DDAVP) 0.2 MG tablet Take 0.4 mg by mouth at bedtime.    Yes [provider]  fluticasone (FLONASE) 50 MCG/ACT nasal spray SHAKE LIQUID AND USE 2 SPRAYS IN EACH NOSTRIL EVERY DAY Patient taking differently: Place 2 sprays into both nostrils daily.  04/24/18  Yes Milus Banister C, DO  fluticasone (FLOVENT HFA) 110 MCG/ACT inhaler Inhale 2 puffs into the lungs 2 (two) times daily. 04/16/17  Yes Mayo, Pete Pelt, MD  metFORMIN (GLUCOPHAGE-XR) 500 MG 24 hr tablet TAKE 1 TABLET BY MOUTH EVERY DAY WITH BREAKFAST Patient taking differently: Take 500 mg by mouth daily with breakfast.  02/16/18  Yes Milus Banister C, DO  montelukast (SINGULAIR) 10 MG tablet Take 1 tablet (10 mg total) by mouth at bedtime. 04/16/17  Yes Mayo, Pete Pelt, MD  olopatadine (PATANOL) 0.1 % ophthalmic solution Place 1 drop into both eyes 2 (two) times daily as needed for allergies. 01/20/17  Yes [provider]  oxybutynin (DITROPAN) 5 MG tablet TAKE 2 TABLETS BY MOUTH AT BEDTIME Patient taking differently: Take 10 mg by mouth at bedtime.  11/19/17  Yes Mayo, Pete Pelt,  MD  pantoprazole (PROTONIX) 20 MG tablet Take 1 tablet (20 mg total) by  mouth daily. 02/16/18  Yes Anderson, Hannah C, DO  PREVIDENT 5000 ENAMEL PROTECT 1.1-5 % PSTE Take 1 application by mouth 2 (two) times daily.  01/14/17  Yes [provider]  torsemide (DEMADEX) 20 MG tablet TAKE 3 TABLETS BY MOUTH ONCE DAILY Patient taking differently: Take 20 mg by mouth daily.  03/16/18  Yes Collene Gobble, MD  ACCU-CHEK AVIVA PLUS test strip as directed 11/02/15   Leone Haven, MD  ACCU-CHEK [facility] LANCETS lancets Use as instructed 11/02/15   Melancon, York Ram, MD  hydrochlorothiazide (HYDRODIURIL) 25 MG tablet Take 1 tablet (25 mg total) by mouth daily. Patient not taking: Reported on 04/25/2018 03/19/18   Daisy Floro, DO  Lancets 30G MISC 1 Device by Does not apply route daily before breakfast. 10/06/14   Leone Haven, MD  Misc. Devices MISC Please provide a pulse oximeter for the ear lobe. Patient has diagnosis of Raynaud, asthma 09/16/11   Judithann Sheen, MD  PROAIR HFA 108 [phone] Base) MCG/ACT inhaler inhale 2 puff by mouth every 6 hours if needed Patient not taking: Reported on 04/25/2018 05/09/16   Collene Gobble, MD  Respiratory Therapy Supplies (NEBULIZER/ADULT MASK) KIT 1 Device by Does not apply route once. 05/20/14   Leone Haven, MD  Respiratory Therapy Supplies MISC BiPAP IPAP:20, EPAP: 8, back up respiratory rate: 10, FIO2: 35%. 04/30/17   Sela Hilding, MD  Spacer/Aero-Holding Chambers (AEROCHAMBER MV) inhaler Use with albuterol inhaler 04/20/12   Verdie Drown, Samuel Germany, MD

## 2018-04-26 NOTE — Progress Notes (Signed)
Patient was placed on his Trilogy by the patient's mother. The patient has 10lpm bleed in for o2 saturation 92-94%. Will continue to monitor.

## 2018-04-26 NOTE — Progress Notes (Signed)
Family Medicine Teaching Service Daily Progress Note Intern Pager: [phone]  Patient name: [name] Medical record number: [id number] Date of birth: Mar 12, 1996 Age: 22 y.o. Gender: male  Primary Care Provider: Dollene ClevelandAnderson, Hannah C, DO Consultants: Pulmonology Code Status: full  Pt Overview and Major Events to Date:  9/21 admitted in respiratory distress  Assessment and Plan: Joshua Buryazhae M Lamartina is a 22 y.o. male presenting with shortness of breath secondary to flash pulmonary edema. PMH is significant for Prader-Willi Syndrome, Morbid Obesity, OHS, HTN, Chronic Respiratory Failure, Raynaud's Syndrome, Asthma, T2DM, Chronic Respiratory Failure, and Gout.  Acute respiratory failure 2/2 Pulmonary Edema, improving: S/p IV lasix 60 yesterday with UOP 1.95L. Initially concerning ACS given presentation with CP but trops neg x2 and am EKG with sinus tach. CTA neg for PE. Most likely HFpEF exacerbation but again does not fit with the sudden onset.  Differential still involves aspiration pneumonia which could have put patient at risk for ARDS.  Now weaned off bipap but this morning on high flow nasal cannula with accessory muscle use.  - give IV lasix x1 60mg  this morning  - check VBG - I&Os - Daily Weights - echocardiogram - monitor electrolytes - mother to bring triology vent from home - Consult pulmonology  Asthma and Allergies - restart home singular, flonase. loratidine and pulmicort as formulary alternatives for cetirizine and flovent while admitted - duonebs prn  HTN, stable. BP 135/88 this am - restart home amlodipine - monitor BPs  T2DM: HbA1c Aug 2019 7.2% down from 7.4%. At home on Metformin-XR 500mg  daily. - mSSI - monitor CBGs  Chronic anemia, stable. Hgb at baseline ~10.5. Last iron 15, ferritin 27 and sat 4 on 08/28/17 suggesting iron deficiency anemia. - consider iron supplementation as outpt  Gout: takes colchicine 0.6mg  and allopurinol 100mg  daily at home - restart  home allopurinol  Nocturnal enuresis: desmopressin 0.4mg  qHS and Oxybutynin 10mg  qHS,  - restart home desmopressin and oxybutynin  GERD: Protonix 20mg  daily - restart home protonix  FEN/GI: heart healthy diet, Miralax prn Prophylaxis: Lovenox  Disposition: pending medical improvement, likely transfer out of SDU later today   Subjective:  Mother at bedside.  Patient states that he is feeling much better today although he still feels somewhat short of breath.  He has no chest pain or palpitations.  He feels hungry, wants to eat.  Mother states that he uses a trilogy event at home at night and she could bring this into the hospital if needed  Objective: Temp:  [97.6 F (36.4 C)-99.7 F (37.6 C)] 97.6 F (36.4 C) (09/22 0424) Pulse Rate:  [91-133] 105 (09/22 0728) Resp:  [19-38] 23 (09/22 0728) BP: (115-145)/(64-96) 135/88 (09/22 0438) SpO2:  [91 %-99 %] 91 % (09/22 0728) FiO2 (%):  [50 %] 50 % (09/21 1940) Weight:  [163.3 kg] 163.3 kg (09/21 1304) Physical Exam: General: Laying in bed comfortably, in no acute distress Cardiovascular: Regular rate and rhythm, no murmurs Respiratory: Increased work of breathing with some accessory muscle use, unable to ascertain crackles due to body habitus on auscultation.  Nasal cannula in place Abdomen: Soft, nontender, nondistended, positive bowel sounds, morbidly obese Extremities: Warm and well-perfused, moving all extremities equally Neuro, alert and oriented, grossly normal without focal deficits  Laboratory: Recent Labs  Lab 04/25/18 1302 04/26/18 0619  WBC 13.2* 8.6  HGB 12.3* 10.8*  HCT 42.0 37.5*  PLT 270 235   Recent Labs  Lab 04/25/18 1302 04/26/18 0450  NA 140 145  K  3.8 4.2  CL 94* 100  CO2 32 28  BUN 28* 26*  CREATININE 1.11 1.01  CALCIUM 8.7* 8.8*  PROT 7.7  --   BILITOT 1.5*  --   ALKPHOS 160*  --   ALT 30  --   AST 39  --   GLUCOSE 239* 110*   EKG: unchanged from previous tracings, sinus  tachycardia.  Trop: <0.03, <0.03  Imaging/Diagnostic Tests: Ct Angio Chest Pe W Or Wo Contrast  Result Date: 04/25/2018 CLINICAL DATA:  Shortness of breath. EXAM: CT ANGIOGRAPHY CHEST WITH CONTRAST TECHNIQUE: Multidetector CT imaging of the chest was performed using the standard protocol during bolus administration of intravenous contrast. Multiplanar CT image reconstructions and MIPs were obtained to evaluate the vascular anatomy. CONTRAST:  ISOVUE-370 IOPAMIDOL (ISOVUE-370) INJECTION 76% COMPARISON:  Chest x-ray today. FINDINGS: Cardiovascular: Heart is enlarged. Aorta is normal caliber. No filling defects in the pulmonary arteries to suggest pulmonary emboli. Mediastinum/Nodes: No mediastinal, hilar, or axillary adenopathy. Lungs/Pleura: Bilateral airspace opacities are noted, right greater than left. Trace right effusion. Upper Abdomen: Imaging into the upper abdomen shows no acute findings. Musculoskeletal: Chest wall soft tissues are unremarkable. No acute bony abnormality. Review of the MIP images confirms the above findings. IMPRESSION: Severe bilateral airspace opacities, right greater than left could reflect asymmetric edema or infection. Mild cardiomegaly. No evidence of pulmonary embolus. Electronically Signed   By: Charlett Nose M.D.   On: 04/25/2018 21:30   Dg Chest Port 1 View  Result Date: 04/25/2018 CLINICAL DATA:  Pulmonary edema, shortness of breath EXAM: PORTABLE CHEST 1 VIEW COMPARISON:  04/25/2018 FINDINGS: Cardiomegaly. Diffuse bilateral airspace disease again noted, not significantly changed. No effusions or acute bony abnormality. IMPRESSION: Severe diffuse bilateral airspace disease with cardiomegaly. Opacities could reflect edema or infection. No change. Electronically Signed   By: Charlett Nose M.D.   On: 04/25/2018 19:13    Leland Her, DO 04/26/2018, 7:28 AM PGY-3, [facility] FPTS Intern pager: [phone], text pages welcome

## 2018-04-27 DIAGNOSIS — J9621 Acute and chronic respiratory failure with hypoxia: Secondary | ICD-10-CM

## 2018-04-27 DIAGNOSIS — J9622 Acute and chronic respiratory failure with hypercapnia: Secondary | ICD-10-CM

## 2018-04-27 LAB — BASIC METABOLIC PANEL
ANION GAP: 11 (ref 5–15)
BUN: 24 mg/dL — AB (ref 6–20)
CALCIUM: 9.1 mg/dL (ref 8.9–10.3)
CO2: 36 mmol/L — ABNORMAL HIGH (ref 22–32)
CREATININE: 0.95 mg/dL (ref 0.61–1.24)
Chloride: 97 mmol/L — ABNORMAL LOW (ref 98–111)
GFR calc Af Amer: >60 mL/min (ref >60)
GLUCOSE: 101 mg/dL — AB (ref 70–99)
Potassium: 4.1 mmol/L (ref 3.5–5.1)
Sodium: 144 mmol/L (ref 135–145)

## 2018-04-27 LAB — GLUCOSE, CAPILLARY
GLUCOSE-CAPILLARY: 107 mg/dL — AB (ref 70–99)
GLUCOSE-CAPILLARY: 114 mg/dL — AB (ref 70–99)
Glucose-Capillary: 128 mg/dL — ABNORMAL HIGH (ref 70–99)
Glucose-Capillary: 156 mg/dL — ABNORMAL HIGH (ref 70–99)

## 2018-04-27 LAB — CBC
HCT: 35.4 % — ABNORMAL LOW (ref 39.0–52.0)
Hemoglobin: 10.2 g/dL — ABNORMAL LOW (ref 13.0–17.0)
MCH: 21.8 pg — ABNORMAL LOW (ref 26.0–34.0)
MCHC: 28.8 g/dL — ABNORMAL LOW (ref 30.0–36.0)
MCV: 75.8 fL — AB (ref 78.0–100.0)
PLATELETS: 252 10*3/uL (ref 150–400)
RBC: 4.67 MIL/uL (ref 4.22–5.81)
RDW: 18.5 % — AB (ref 11.5–15.5)
WBC: 8.5 10*3/uL (ref 4.0–10.5)

## 2018-04-27 LAB — LEGIONELLA PNEUMOPHILA SEROGP 1 UR AG: L. pneumophila Serogp 1 Ur Ag: NEGATIVE

## 2018-04-27 LAB — PROCALCITONIN: Procalcitonin: 1.36 ng/mL

## 2018-04-27 MED ORDER — FUROSEMIDE 10 MG/ML IJ SOLN
60.0000 mg | Freq: Once | INTRAMUSCULAR | Status: AC
  Administered 2018-04-27: 60 mg via INTRAVENOUS
  Filled 2018-04-27: qty 6

## 2018-04-27 NOTE — Progress Notes (Addendum)
Family Medicine Teaching Service Daily Progress Note Intern Pager: [phone]  Patient name: [name] Medical record number: [id number] Date of birth: 1996-08-05 Age: 22 y.o. Gender: male  Primary Care Provider: Dollene Cleveland, DO Consultants: Pulmonology  Code Status: Full code  Pt Overview and Major Events to Date:  Hospital Day 2 Admitted: 04/25/2018  Assessment and Plan: Joshua Elizondo Roachis a 22 y.o.malepresenting with shortness of breath secondary to flash pulmonary edema.  PMH is significant forPrader-Willi Syndrome, Morbid Obesity, OHS, HTN, Chronic Respiratory Failure, Raynaud's Syndrome, Asthma, T2DM, Chronic Respiratory Failure, and Gout.  #Acute respiratory failurelikely 2/2 pna, improving Initially requiring BiPAP, now on home Trilogy vent. SOB improving.   I&O, Daily Weights, daily BMP  Pulmonary Consult, appreciate recs   ABx for CAP as below  Strep neurmo negative   Meds/Therapies   Placed on home Trilogy vent from home at night and for after meals when sleepy. Otherwise was doing well on [place].   Lasix x 1 0900. Will follow later today for daily lasix therapy  Otherwise high flow   Labs  Blood cxs negative to date   VBG, pCO2 75.0  Echocardiogram, 60-65% EF   #CAP, acute  Meds/Therapies   Started on ceftriaxone every 24 hours, Zithromax 500 mg 2-50 taper to 24.  All antibiotics started 9/22  Labs/studies  Procalcitonin 0.18 > 1.& CRP 16.5 per CC  Urine Legionella pending. Pneumococcal Ag negative.   #PWS   Meds/Therapies   Placed on home Trilogy vent from home. Appreciate RT management. BiPAP overnight.   #Asthma and Allergies  Restarted home singular, flonase. loratidine and pulmicort as formulary alternatives for cetirizine and flovent while admitted  duonebs PRN  #HTN, stable BP 138/82 this am  restart home amlodipine  monitor BPs  #T2DM, chronic HbA1c Aug 2019 7.2% down from 7.4%. At home on Metformin-XR 500mg   daily.  mSSI-required 3 units all day yesterday  monitor CBGs -fasting glucose this morning not yet started  #Iron Deficiency anemia, chronic  stable  10.2 this morning.  Hgb at baseline ~10.5. Last iron 15, ferritin 27 and sat 4 on 08/28/17 suggesting iron deficiency anemia.  consider iron supplementation as outpt  #Gout, chronic takes colchicine 0.6mg  and allopurinol 100mg  daily at home  restart home allopurinol  #Nocturnal enuresis, chronic Desmopressin 0.4mg  qHS and Oxybutynin 10mg  qHS,   restart home desmopressin   Holding oxybutynin with diuresis.   #GERD, chronic  Protonix 20mg  daily  restart home protonix  FEN/GI:heart healthy diet, Miralax prn Prophylaxis:Lovenox  Disposition: pending medical improvement, likely transfer out of SDU later today   Medications: Scheduled Meds: . amLODipine  10 mg Oral Daily  . budesonide  0.25 mg Nebulization BID  . desmopressin  0.4 mg Oral QHS  . enoxaparin (LOVENOX) injection  40 mg Subcutaneous Q24H  . fluticasone  2 spray Each Nare Daily  . Influenza vac split quadrivalent PF  0.5 mL Intramuscular Tomorrow-1000  . insulin aspart  0-15 Units Subcutaneous TID WC  . loratadine  10 mg Oral Daily  . montelukast  10 mg Oral QHS  . oxybutynin  10 mg Oral QHS  . pantoprazole  20 mg Oral Daily   Continuous Infusions: . azithromycin 500 mg (04/26/18 1737)  . cefTRIAXone (ROCEPHIN)  IV 1 g (04/26/18 1639)   PRN Meds: acetaminophen **OR** acetaminophen, ipratropium-albuterol, polyethylene glycol  ================================================= ================================================= Subjective:  Doing well this morning. Was hungry and had a good breakfast. Mom reports that he is looking much better than he  was yesterday. Mom reports 4-5 O2 drops to the 70's overnight which she is concerned about. Wondering if BiPAP needs new settings.   Objective: Temp:  [97.8 F (36.6 C)-99.9 F (37.7 C)] 97.8 F (36.6  C) (09/23 0300) Pulse Rate:  [104-106] 104 (09/22 2206) Resp:  [21-34] 30 (09/23 0300) BP: (129-143)/(76-86) 138/82 (09/23 0300) SpO2:  [91 %-99 %] 98 % (09/23 0300) FiO2 (%):  [50 %] 50 % (09/22 1036) Weight:  [164 kg] 164 kg (09/22 2206) Intake/Output 09/22 0701 - 09/23 0700 In: -  Out: 2300 [Urine:2300]   Physical Exam:  Gen: NAD, alert, non-toxic, well-nourished, well-appearing, lying comfortably with BiPAP.  HEENT: Normocephaic, atraumatic. Clear conjuctiva, no scleral icterus and injection.  CV: Regular rate and rhythm.  Normal S1-S2.   Normal capillary refill bilaterally.  Radial pulses 2+ bilaterally. No bilateral lower extremity edema. Resp: Difficult to appreciate breath sounds 2/2 patient's body habitus.  Abd: Nontender and nondistended on palpation to all 4 quadrants.  Positive bowel sounds. Psych: Cooperative with exam. Pleasant. Makes eye contact.  Laboratory: Recent Labs  Lab 04/25/18 1302 04/26/18 0619  WBC 13.2* 8.6  HGB 12.3* 10.8*  HCT 42.0 37.5*  PLT 270 235   Recent Labs  Lab 04/25/18 1302 04/26/18 0450 04/27/18 0729  NA 140 145 144  K 3.8 4.2 4.1  CL 94* 100 97*  CO2 32 28 36*  BUN 28* 26* 24*  CREATININE 1.11 1.01 0.95  CALCIUM 8.7* 8.8* 9.1  PROT 7.7  --   --   BILITOT 1.5*  --   --   ALKPHOS 160*  --   --   ALT 30  --   --   AST 39  --   --   GLUCOSE 239* 110* 101*   Future Labs:  Venous blood gas ? (not ordered)  Imaging/Diagnostic Tests: Ct Angio Chest Pe W Or Wo Contrast  Result Date: 04/25/2018 CLINICAL DATA:  Shortness of breath. EXAM: CT ANGIOGRAPHY CHEST WITH CONTRAST TECHNIQUE: Multidetector CT imaging of the chest was performed using the standard protocol during bolus administration of intravenous contrast. Multiplanar CT image reconstructions and MIPs were obtained to evaluate the vascular anatomy. CONTRAST:  100mL ISOVUE-370 IOPAMIDOL (ISOVUE-370) INJECTION 76% COMPARISON:  Chest x-ray today. FINDINGS: Cardiovascular: Heart  is enlarged. Aorta is normal caliber. No filling defects in the pulmonary arteries to suggest pulmonary emboli. Mediastinum/Nodes: No mediastinal, hilar, or axillary adenopathy. Lungs/Pleura: Bilateral airspace opacities are noted, right greater than left. Trace right effusion. Upper Abdomen: Imaging into the upper abdomen shows no acute findings. Musculoskeletal: Chest wall soft tissues are unremarkable. No acute bony abnormality. Review of the MIP images confirms the above findings. IMPRESSION: Severe bilateral airspace opacities, right greater than left could reflect asymmetric edema or infection. Mild cardiomegaly. No evidence of pulmonary embolus. Electronically Signed   By: Charlett NoseKevin  Dover M.D.   On: 04/25/2018 21:30   Dg Chest Port 1 View  Result Date: 04/25/2018 CLINICAL DATA:  Pulmonary edema, shortness of breath EXAM: PORTABLE CHEST 1 VIEW COMPARISON:  04/25/2018 FINDINGS: Cardiomegaly. Diffuse bilateral airspace disease again noted, not significantly changed. No effusions or acute bony abnormality. IMPRESSION: Severe diffuse bilateral airspace disease with cardiomegaly. Opacities could reflect edema or infection. No change. Electronically Signed   By: Charlett NoseKevin  Dover M.D.   On: 04/25/2018 19:13   Dg Chest Port 1 View  Result Date: 04/25/2018 CLINICAL DATA:  Shortness of breath. EXAM: PORTABLE CHEST 1 VIEW COMPARISON:  09/30/2017. FINDINGS: 1349 hours. The cardio pericardial  silhouette is enlarged. Vascular congestion with pulmonary edema pattern bilaterally. The visualized bony structures of the thorax are intact. Telemetry leads overlie the chest. IMPRESSION: Cardiomegaly with pulmonary edema. Electronically Signed   By: Kennith Center M.D.   On: 04/25/2018 14:20      Melene Plan, MD 04/27/2018, 6:38 AM PGY-1, [facility] FPTS Intern pager: [phone], text pages welcome

## 2018-04-27 NOTE — Care Management Note (Addendum)
Case Management Note  Patient Details  Name: [name] MRN: [id number] Date of Birth: [birth year]  Subjective/Objective:   From home with mom, pulmonary edema. Hx of prader-willi syndrome-(is on triology vent from home), morbid obesity, dm, chf, acute/chronic hypoxic resp failure, poss pna, HFNC 10     9/25 Joshua Capeeborah Kahdijah Errickson RN, BSN - he is not making progress , may need intubation and possibly trach per MD note, transferred to ICU.              Action/Plan: NCM will follow for transition of care needs.   Expected Discharge Date:                  Expected Discharge Plan:  Home w Home Health Services  In-House Referral:     Discharge planning Services  CM Consult  Post Acute Care Choice:    Choice offered to:     DME Arranged:    DME Agency:     HH Arranged:    HH Agency:     Status of Service:  In process, will continue to follow  If discussed at Long Length of Stay Meetings, dates discussed:    Additional Comments:  [name], Joshua Kamiya Clinton, RN 04/27/2018, 9:33 AM

## 2018-04-27 NOTE — Progress Notes (Signed)
NAME:  [name], MRN:  [id number], DOB:  [birth year], LOS: 2 ADMISSION DATE:  04/25/2018, CONSULTATION DATE:  04/26/2018 REFERRING MD:  Dr. Manson Passey, FPTS CHIEF COMPLAINT:  Short of breath   Brief History   22 yo male with hx of Prader Willi syndrome with morbid obesity and sleep disordered breathing on 2 liters oxygen and Trilogy home vent presented with shortness of breath, hypoxia, cough, sputum, and swelling.  Tx for pulmonary edema and CAP.  He is followed by Dr. Delton Coombes in pulmonary office.  Past Medical History Raynaud's phenomenon, DM, Chronic diastolic CHF, Asthma  Significant Hospital Events   9/21 Admit  Consults: date of consult/date signed off & final recs:   Procedures (surgical and bedside):   Significant Diagnostic Tests:  CT angio chest 9/21 (reviewed by me) >> b/l airspace opacities Rt > Lt Echo 9/22 >> EF 60 to 65%, mild LVH, poor window  Micro Data: Blood 9/21 >>   Antimicrobials:  Rocephin 9/22 >> Zithromax 9/22 >>   Subjective:  Was more sleepy this morning.  Better after wearing Bipap.  Not having as much cough or chest congestion.  Mother asking why his oxygen is low at night when he is on Trilogy and oxygen.  Objective   Blood pressure 135/69, pulse 81, temperature 97.6 F (36.4 C), temperature source Oral, resp. rate (!) 30, height 4\' 7"  (1.397 m), weight (!) 164 kg, SpO2 96 %.        Intake/Output Summary (Last 24 hours) at 04/27/2018 1101 Last data filed at 04/27/2018 0300 Gross per 24 hour  Intake -  Output 3300 ml  Net -3300 ml   Filed Weights   04/25/18 1304 04/26/18 2206  Weight: (!) 163.3 kg (!) 164 kg    Examination:  General - alert Eyes - pupils reactive ENT - Bipap on Cardiac - regular rate/rhythm, no murmur Chest - distant breath sounds, no wheeze Abdomen - soft, non tender, + bowel sounds Extremities - 2+ non pitting edema Skin - no rashes Lymphatics - no lymphadenopathy Neuro - follows commands, moves extremities Psych  - normal mood and behavior   Assessment & Plan:   Acute on chronic hypoxic/hypercapnic respiratory failure from OSA/OHS with acute on chronic diastolic CHF and possible CAP. Asthma, allergic rhinitis. - continue ABx - oxygen to keep SpO2 90 to 95% - Trilogy qhs and prn during the day - diuresis as tolerated - singulair, claritin, flonase prn BDs   Disposition / Summary of Today's Plan 04/27/18   Bipap qhs and prn.  Continue diuresis.      Diet: Heart healthy, carb modified DVT prophylaxis: Lovenox GI prophylaxis: protonix Hyperglycemia protocol: SSI Mobility: As tolerated Code Status: Full code Family Communication: Updated mother at bedside  Labs   CBC: Recent Labs  Lab 04/25/18 1302 04/26/18 0619 04/27/18 0729  WBC 13.2* 8.6 8.5  NEUTROABS 10.0*  --   --   HGB 12.3* 10.8* 10.2*  HCT 42.0 37.5* 35.4*  MCV 75.5* 75.9* 75.8*  PLT 270 235 252   Basic Metabolic Panel: Recent Labs  Lab 04/25/18 1302 04/26/18 0450 04/27/18 0729  NA 140 145 144  K 3.8 4.2 4.1  CL 94* 100 97*  CO2 32 28 36*  GLUCOSE 239* 110* 101*  BUN 28* 26* 24*  CREATININE 1.11 1.01 0.95  CALCIUM 8.7* 8.8* 9.1   GFR: Estimated Creatinine Clearance: 150.4 mL/min (by C-G formula based on SCr of 0.95 mg/dL). Recent Labs  Lab 04/25/18 1302 04/25/18 1312 04/26/18  [id number] 04/26/18 1451 04/27/18 0729  PROCALCITON  --   --   --  0.19 1.36  WBC 13.2*  --  8.6  --  8.5  LATICACIDVEN  --  1.17  --   --   --    Liver Function Tests: Recent Labs  Lab 04/25/18 1302  AST 39  ALT 30  ALKPHOS 160*  BILITOT 1.5*  PROT 7.7  ALBUMIN 3.3*   No results for input(s): LIPASE, AMYLASE in the last 168 hours. No results for input(s): AMMONIA in the last 168 hours. ABG    Component Value Date/Time   PHART 7.311 (L) 08/29/2017 0641   PCO2ART 72.0 (HH) 08/29/2017 0641   PO2ART 102 08/29/2017 0641   HCO3 40.3 (H) 04/26/2018 1615   TCO2 44 (H) 04/25/2018 1309   O2SAT 87.6 04/26/2018 1615      Coagulation Profile: No results for input(s): INR, PROTIME in the last 168 hours. Cardiac Enzymes: Recent Labs  Lab 04/25/18 1905 04/26/18 0450 04/26/18 1022  TROPONINI <0.03 <0.03 <0.03   HbA1C: HbA1c, POC (controlled diabetic range)  Date/Time Value Ref Range Status  03/10/2018 02:10 PM 7.2 (A) 0.0 - 7.0 % Final   Hgb A1c MFr Bld  Date/Time Value Ref Range Status  08/28/2017 06:48 PM 7.4 (H) 4.8 - 5.6 % Final    Comment:    (NOTE) Pre diabetes:          5.7%-6.4% Diabetes:              >6.4% Glycemic control for   <7.0% adults with diabetes   02/27/2017 07:32 AM 6.6 (H) 4.8 - 5.6 % Final    Comment:    (NOTE)         Pre-diabetes: 5.7 - 6.4         Diabetes: >6.4         Glycemic control for adults with diabetes: <7.0    CBG: Recent Labs  Lab 04/26/18 0704 04/26/18 1114 04/26/18 1541 04/26/18 2155 04/27/18 0717  GLUCAP 108* 165* 154* 102* 107*    Coralyn HellingVineet Emori Kamau, MD [facility] 04/27/2018, 11:10 AM

## 2018-04-28 ENCOUNTER — Inpatient Hospital Stay (HOSPITAL_COMMUNITY): Payer: Medicaid Other

## 2018-04-28 DIAGNOSIS — E662 Morbid (severe) obesity with alveolar hypoventilation: Secondary | ICD-10-CM

## 2018-04-28 LAB — COMPREHENSIVE METABOLIC PANEL
ALT: 20 U/L (ref 0–44)
AST: 31 U/L (ref 15–41)
Albumin: 2.6 g/dL — ABNORMAL LOW (ref 3.5–5.0)
Alkaline Phosphatase: 126 U/L (ref 38–126)
Anion gap: 17 — ABNORMAL HIGH (ref 5–15)
BUN: 27 mg/dL — AB (ref 6–20)
CHLORIDE: 97 mmol/L — AB (ref 98–111)
CO2: 30 mmol/L (ref 22–32)
Calcium: 9.1 mg/dL (ref 8.9–10.3)
Creatinine, Ser: 1 mg/dL (ref 0.61–1.24)
GFR calc Af Amer: >60 mL/min (ref >60)
Glucose, Bld: 135 mg/dL — ABNORMAL HIGH (ref 70–99)
POTASSIUM: 4.6 mmol/L (ref 3.5–5.1)
Sodium: 144 mmol/L (ref 135–145)
Total Bilirubin: 1.5 mg/dL — ABNORMAL HIGH (ref 0.3–1.2)
Total Protein: 6.8 g/dL (ref 6.5–8.1)

## 2018-04-28 LAB — BLOOD GAS, ARTERIAL
ACID-BASE EXCESS: 12.3 mmol/L — AB (ref 0.0–2.0)
ACID-BASE EXCESS: 12.9 mmol/L — AB (ref 0.0–2.0)
Acid-Base Excess: 13.6 mmol/L — ABNORMAL HIGH (ref 0.0–2.0)
BICARBONATE: 40.5 mmol/L — AB (ref 20.0–28.0)
Bicarbonate: 39.2 mmol/L — ABNORMAL HIGH (ref 20.0–28.0)
Bicarbonate: 39 mmol/L — ABNORMAL HIGH (ref 20.0–28.0)
DELIVERY SYSTEMS: POSITIVE
DRAWN BY: 533091
DRAWN BY: 330991
Delivery systems: POSITIVE
Drawn by: 533091
FIO2: 65
FIO2: 40
FIO2: 40
O2 SAT: 92.7 %
O2 SAT: 87.7 %
O2 Saturation: 87.4 %
PCO2 ART: 84.6 mmHg — AB (ref 32.0–48.0)
PH ART: 7.287 — AB (ref 7.350–7.450)
Patient temperature: 98.5
Patient temperature: 100.1
Patient temperature: 100.1
pCO2 arterial: 90.9 mmHg (ref 32.0–48.0)
pCO2 arterial: 76.4 mmHg (ref 32.0–48.0)
pH, Arterial: 7.277 — ABNORMAL LOW (ref 7.350–7.450)
pH, Arterial: 7.333 — ABNORMAL LOW (ref 7.350–7.450)
pO2, Arterial: 73.3 mmHg — ABNORMAL LOW (ref 83.0–108.0)
pO2, Arterial: 64.2 mmHg — ABNORMAL LOW (ref 83.0–108.0)
pO2, Arterial: 62.2 mmHg — ABNORMAL LOW (ref 83.0–108.0)

## 2018-04-28 LAB — GLUCOSE, CAPILLARY
GLUCOSE-CAPILLARY: 149 mg/dL — AB (ref 70–99)
Glucose-Capillary: 92 mg/dL (ref 70–99)
Glucose-Capillary: 107 mg/dL — ABNORMAL HIGH (ref 70–99)
Glucose-Capillary: 137 mg/dL — ABNORMAL HIGH (ref 70–99)

## 2018-04-28 LAB — PROCALCITONIN: PROCALCITONIN: 0.11 ng/mL

## 2018-04-28 MED ORDER — FUROSEMIDE 10 MG/ML IJ SOLN
40.0000 mg | Freq: Four times a day (QID) | INTRAMUSCULAR | Status: AC
  Administered 2018-04-28 (×2): 40 mg via INTRAVENOUS
  Filled 2018-04-28 (×2): qty 4

## 2018-04-28 MED ORDER — FUROSEMIDE 10 MG/ML IJ SOLN
60.0000 mg | Freq: Once | INTRAMUSCULAR | Status: AC
  Administered 2018-04-28: 60 mg via INTRAVENOUS
  Filled 2018-04-28: qty 6

## 2018-04-28 MED ORDER — OXYCODONE HCL 5 MG PO TABS
2.5000 mg | ORAL_TABLET | Freq: Once | ORAL | Status: AC
  Administered 2018-04-28: 2.5 mg via ORAL
  Filled 2018-04-28: qty 1

## 2018-04-28 NOTE — Progress Notes (Signed)
Pateint family is questioning effectiveness of home Trilogy. States that his SpO2 was low last night and he was heard snoring while Trilogy was on. Home Trilogy is currently set to BIPAP S/T mode 20/15 10lpm bleed in.

## 2018-04-28 NOTE — Progress Notes (Signed)
NAME:  [name], MRN:  [id number], DOB:  18-Nov-1995, LOS: 3 ADMISSION DATE:  04/25/2018, CONSULTATION DATE:  04/26/2018 REFERRING MD:  Dr. Manson Passey, FPTS CHIEF COMPLAINT:  Short of breath   Brief History   22 yo male with hx of Prader Willi syndrome with morbid obesity and sleep disordered breathing on 2 liters oxygen and Trilogy home vent presented with shortness of breath, hypoxia, cough, sputum, and swelling.  Tx for pulmonary edema and CAP.  He is followed by Dr. Delton Coombes in pulmonary office.  Past Medical History Raynaud's phenomenon, DM, Chronic diastolic CHF, Asthma  Significant Hospital Events   9/21 Admit 9/24 Change to ASV mode for Trilogy due to oxygen desaturation and increased WOB  Consults: date of consult/date signed off & final recs:   Procedures (surgical and bedside):   Significant Diagnostic Tests:  CT angio chest 9/21 (reviewed by me) >> b/l airspace opacities Rt > Lt Echo 9/22 >> EF 60 to 65%, mild LVH, poor window  Micro Data: Blood 9/21 >>   Antimicrobials:  Rocephin 9/22 >> Zithromax 9/22 >>   Subjective:  Oxygen desaturation overnight.  On more supplemental oxygen.  C/o feeling more short of breath.  Objective   Blood pressure (!) 149/99, pulse 98, temperature 98.5 F (36.9 C), temperature source Oral, resp. rate (!) 25, height 4\' 7"  (1.397 m), weight (!) 164 kg, SpO2 94 %.        Intake/Output Summary (Last 24 hours) at 04/28/2018 1009 Last data filed at 04/27/2018 2000 Gross per 24 hour  Intake 1082.83 ml  Output 700 ml  Net 382.83 ml   Filed Weights   04/26/18 2206 04/27/18 2201 04/28/18 0630  Weight: (!) 164 kg (!) 164 kg (!) 164 kg    Examination:  General - alert Eyes - pupils reactive ENT - no sinus tenderness, no stridor Cardiac - regular rate/rhythm, no murmur Chest - increased WOB, decreased breath sounds, no wheeze Abdomen - soft, non tender Extremities - 2+ non pitting edema Skin - no rashes Lymphatics - no  lymphadenopathy Neuro -  normal strength, moves extremities, follows commands Psych - normal mood and behavior   Assessment & Plan:   Acute on chronic hypoxic/hypercapnic respiratory failure from OSA/OHS with acute on chronic diastolic CHF and possible CAP. Asthma, allergic rhinitis. - lasix 40 mg q6h x 2 doses on 9/24 - was on Bipap 20/15 cm H2O with oxygen bleed in >> having increased WOB and oxygen desaturation - on 9/24 change to AVAPS mode Vt 385, and adjust IPAP/EPAP based on ABG and SpO2; d/w RT - day 3/5 of ABx - oxygen to keep SpO2 90 to 95% - continues singulair, claritin, flonase, prn BDs - f/u CXR 9/25   Disposition / Summary of Today's Plan 04/28/18   Changed to AVAPS.  Try to push diuresis.       Diet: Heart healthy, carb modified DVT prophylaxis: Lovenox GI prophylaxis: protonix Hyperglycemia protocol: SSI Mobility: As tolerated Code Status: Full code Family Communication: updated mother at bedside  Labs   CBC: Recent Labs  Lab 04/25/18 1302 04/26/18 0619 04/27/18 0729  WBC 13.2* 8.6 8.5  NEUTROABS 10.0*  --   --   HGB 12.3* 10.8* 10.2*  HCT 42.0 37.5* 35.4*  MCV 75.5* 75.9* 75.8*  PLT 270 235 252   Basic Metabolic Panel: Recent Labs  Lab 04/25/18 1302 04/26/18 0450 04/27/18 0729 04/28/18 0324  NA 140 145 144 144  K 3.8 4.2 4.1 4.6  CL 94*  100 97* 97*  CO2 32 28 36* 30  GLUCOSE 239* 110* 101* 135*  BUN 28* 26* 24* 27*  CREATININE 1.11 1.01 0.95 1.00  CALCIUM 8.7* 8.8* 9.1 9.1   GFR: Estimated Creatinine Clearance: 142.9 mL/min (by C-G formula based on SCr of 1 mg/dL). Recent Labs  Lab 04/25/18 1302 04/25/18 1312 04/26/18 0619 04/26/18 1451 04/27/18 0729 04/28/18 0653  PROCALCITON  --   --   --  0.19 1.36 0.11  WBC 13.2*  --  8.6  --  8.5  --   LATICACIDVEN  --  1.17  --   --   --   --    Liver Function Tests: Recent Labs  Lab 04/25/18 1302 04/28/18 0324  AST 39 31  ALT 30 20  ALKPHOS 160* 126  BILITOT 1.5* 1.5*  PROT  7.7 6.8  ALBUMIN 3.3* 2.6*   No results for input(s): LIPASE, AMYLASE in the last 168 hours. No results for input(s): AMMONIA in the last 168 hours. ABG    Component Value Date/Time   PHART 7.311 (L) 08/29/2017 0641   PCO2ART 72.0 (HH) 08/29/2017 0641   PO2ART 102 08/29/2017 0641   HCO3 40.3 (H) 04/26/2018 1615   TCO2 44 (H) 04/25/2018 1309   O2SAT 87.6 04/26/2018 1615    Coagulation Profile: No results for input(s): INR, PROTIME in the last 168 hours. Cardiac Enzymes: Recent Labs  Lab 04/25/18 1905 04/26/18 0450 04/26/18 1022  TROPONINI <0.03 <0.03 <0.03   HbA1C: HbA1c, POC (controlled diabetic range)  Date/Time Value Ref Range Status  03/10/2018 02:10 PM 7.2 (A) 0.0 - 7.0 % Final   Hgb A1c MFr Bld  Date/Time Value Ref Range Status  08/28/2017 06:48 PM 7.4 (H) 4.8 - 5.6 % Final    Comment:    (NOTE) Pre diabetes:          5.7%-6.4% Diabetes:              >6.4% Glycemic control for   <7.0% adults with diabetes   02/27/2017 07:32 AM 6.6 (H) 4.8 - 5.6 % Final    Comment:    (NOTE)         Pre-diabetes: 5.7 - 6.4         Diabetes: >6.4         Glycemic control for adults with diabetes: <7.0    CBG: Recent Labs  Lab 04/27/18 0717 04/27/18 1202 04/27/18 1644 04/27/18 2129 04/28/18 0753  GLUCAP 107* 128* 114* 156* 92    Coralyn HellingVineet Aayana Reinertsen, MD [facility] 04/28/2018, 10:09 AM

## 2018-04-28 NOTE — Progress Notes (Signed)
Called Dr Vassie LollAlva about ABG results. Wants to increase Min IPAP to 18. And get ABG at 2000.

## 2018-04-28 NOTE — Progress Notes (Signed)
eLink Physician-Brief Progress Note Patient Name: Evaristo Buryazhae M Myles DOB: 07/18/1996 MRN: [id number]   Date of Service  04/28/2018  HPI/Events of Note  Increased fio2 requirements, not increased WOB  eICU Interventions  Give lasix 60 x 1 and check CXR     Intervention Category Evaluation Type: Other  Norm Wray 04/28/2018, 11:02 PM

## 2018-04-28 NOTE — Progress Notes (Signed)
Patient reassessed secondary to worsening ABG.  Changes already made per Dr. Vassie LollAlva.  Mother reports patient was off for lunch only for about 45 mins, but states he has been much more alert this afternoon.  On exam, patient is continuously alert, interactive and at baseline per his mother.  Patient without complaints.  Well fitting full face mask with TV~400, MVe 11-12, 94-96% on 40% FiO2  He remains hemodynamically stable.  Clear distant anterior breath sounds, no wheeze, non-labored.    P:  Repeat ABG at 2000 Hold in SDU for now Will continue to monitor   Posey BoyerBrooke Juletta Berhe, AGACNP-BC [facility] Pgr: [phone] or if no answer [phone] 04/28/2018, 6:14 PM

## 2018-04-28 NOTE — Progress Notes (Signed)
Family Medicine Teaching Service Daily Progress Note Intern Pager: [phone]  Patient name: [name] Medical record number: [id number] Date of birth: 02-Oct-1995 Age: 22 y.o. Gender: male  Primary Care Provider: Dollene Cleveland, DO Consultants: pulmonology Code Status: Full code  Pt Overview and Major Events to Date:  Hospital Day 3 Admitted: 04/25/2018   Assessment and Plan: Delaine Canter Roachis a 22 y.o.malepresenting with shortness of breath secondary to flash pulmonary edema.  PMH is significant forPrader-Willi Syndrome, Morbid Obesity, OHS, HTN, Chronic Respiratory Failure, Raynaud's Syndrome, Asthma, T2DM, Chronic Respiratory Failure, and Gout.  #Acute respiratory failurelikely 2/2 pna, improving Patient on trilogy at night and HFNC 10L during the day. desat into 50s overnight. Patient negative for legionella and pneumococcus. PCO2 was 75. Echo showed 60-65% EF. Blood cx. Negative to date.   I&O, Daily Weights, daily BMP  Lasix 40 BID, monitor fluid status to see if increase is necessary.    Pulmonary Consult, appreciate recs   ABx for CAP as below  Placed on home Trilogy vent from home at night and for after meals when sleepy. Switched trilogy to ventilator mode.   #CAP, acute  Meds/Therapies  ? Started on ceftriaxone every 24 hours, Zithromax 500 mg 2-50 taper to 24.  All antibiotics started 9/22  Labs/studies ? Procalcitonin 0.18 > 1.36 > .11 CRP 16.5 per CC  # headache - patient developed headache this morning.   - 2.5 oxycodone one time.   #Prader Johnnette Gourd Syndrome   Meds/Therapies  ? Placed on home Trilogy vent from home. Appreciate RT management. BiPAP overnight.   #Asthma and Allergies  Restarted home singular, flonase. loratidine and pulmicort as formulary alternatives for cetirizine and flovent while admitted  duonebs PRN  #HTN, stable BP 149/99  restart home amlodipine  monitor BPs  #T2DM, chronic HbA1c Aug 2019 7.2% down from  7.4%.At home onMetformin-XR 500mg  daily.  mSSI-required 3 units all day yesterday  monitor CBGs -fasting glucose this morning not yet started  #Iron Deficiency anemia, chronic  stable  10.2 this morning.  Hgb at baseline ~10.5. Last iron 15, ferritin 27 and sat 4 on 08/28/17 suggesting iron deficiency anemia.  consider iron supplementation as outpt  #Gout, chronic takes colchicine 0.6mg  and allopurinol 100mg  dailyat home  restart home allopurinol  #Nocturnal enuresis, chronic Desmopressin 0.4mg  qHS and Oxybutynin 10mg  qHS,   restart home desmopressin   Holding oxybutynin with diuresis.   #GERD, chronic  Protonix 20mg  daily  restart home protonix  FEN/GI:heart healthy diet, Miralaxprn Prophylaxis:Lovenox  Disposition:pending medical improvement, likely transfer out of SDU later today   Medications: Scheduled Meds: . amLODipine  10 mg Oral Daily  . budesonide  0.25 mg Nebulization BID  . desmopressin  0.4 mg Oral QHS  . enoxaparin (LOVENOX) injection  40 mg Subcutaneous Q24H  . fluticasone  2 spray Each Nare Daily  . Influenza vac split quadrivalent PF  0.5 mL Intramuscular Tomorrow-1000  . insulin aspart  0-15 Units Subcutaneous TID WC  . loratadine  10 mg Oral Daily  . montelukast  10 mg Oral QHS  . pantoprazole  20 mg Oral Daily   Continuous Infusions: . azithromycin Stopped (04/27/18 2000)  . cefTRIAXone (ROCEPHIN)  IV Stopped (04/27/18 1825)   PRN Meds: acetaminophen **OR** acetaminophen, ipratropium-albuterol, polyethylene glycol  ================================================= ================================================= Subjective:  Mom states the patient was desatting into  The 50's overnight.  She thinks the trilogy machine is broken or on the wrong setting.  She thinks it should be on ventilator  setting to help him breath off the oxygen he accumulates during the day, but it is on bipap mode.  He is currently on 10L HFNC.  He did  not get a lot of sleep due to being woken up when he would desat.   Objective: Temp:  [97.6 F (36.4 C)-98.5 F (36.9 C)] 98.4 F (36.9 C) (09/24 0300) Pulse Rate:  [81-103] 98 (09/24 0300) Resp:  [26-32] 28 (09/24 0300) BP: (123-147)/(69-107) 146/90 (09/24 0300) SpO2:  [52 %-99 %] 98 % (09/24 0300) Weight:  [164 kg] 164 kg (09/23 2201) Intake/Output 09/23 0701 - 09/24 0700 In: 1082.8 [P.O.:240; IV Piggyback:842.8] Out: 700 [Urine:700] Physical Exam:  Gen: NAD, alert, non-toxic, obese, well-appearing, sitting comfortably  HEENT: Normocephaic, atraumatic. Clear conjuctiva, no scleral icterus and injection.  CV: Regular rate and rhythm. Difficult to auscultate heart sounds   Resp: difficult to auscultate breath sounds.  Clear breath sounds in upper lobes bilaterally. No increased work of breathing appreciated. Psych: Cooperative with exam. Able to have simple conversations.    Laboratory: Recent Labs  Lab 04/25/18 1302 04/26/18 0619 04/27/18 0729  WBC 13.2* 8.6 8.5  HGB 12.3* 10.8* 10.2*  HCT 42.0 37.5* 35.4*  PLT 270 235 252   Recent Labs  Lab 04/25/18 1302 04/26/18 0450 04/27/18 0729 04/28/18 0324  NA 140 145 144 144  K 3.8 4.2 4.1 4.6  CL 94* 100 97* 97*  CO2 32 28 36* 30  BUN 28* 26* 24* 27*  CREATININE 1.11 1.01 0.95 1.00  CALCIUM 8.7* 8.8* 9.1 9.1  PROT 7.7  --   --  6.8  BILITOT 1.5*  --   --  1.5*  ALKPHOS 160*  --   --  126  ALT 30  --   --  20  AST 39  --   --  31  GLUCOSE 239* 110* 101* 135*   Imaging/Diagnostic Tests: No results found.    Sandre Kittylson, Yanelie Abraha K, MD 04/28/2018, 5:58 AM PGY-1, [facility] FPTS Intern pager: [phone], text pages welcome

## 2018-04-29 ENCOUNTER — Inpatient Hospital Stay (HOSPITAL_COMMUNITY): Payer: Medicaid Other

## 2018-04-29 LAB — BLOOD GAS, ARTERIAL
Acid-Base Excess: 13.8 mmol/L — ABNORMAL HIGH (ref 0.0–2.0)
BICARBONATE: 40.6 mmol/L — AB (ref 20.0–28.0)
DELIVERY SYSTEMS: POSITIVE
DRAWN BY: 330991
FIO2: 65
MECHVT: 400 mL
O2 Saturation: 94.6 %
Patient temperature: 98.6
RATE: 14 resp/min
pCO2 arterial: 85.6 mmHg (ref 32.0–48.0)
pH, Arterial: 7.298 — ABNORMAL LOW (ref 7.350–7.450)
pO2, Arterial: 79.1 mmHg — ABNORMAL LOW (ref 83.0–108.0)

## 2018-04-29 LAB — C-REACTIVE PROTEIN: CRP: 11.9 mg/dL — AB (ref <1.0)

## 2018-04-29 LAB — CBC
HCT: 35.8 % — ABNORMAL LOW (ref 39.0–52.0)
Hemoglobin: 10.1 g/dL — ABNORMAL LOW (ref 13.0–17.0)
MCH: 21.8 pg — ABNORMAL LOW (ref 26.0–34.0)
MCHC: 28.2 g/dL — ABNORMAL LOW (ref 30.0–36.0)
MCV: 77.3 fL — AB (ref 78.0–100.0)
Platelets: 254 10*3/uL (ref 150–400)
RBC: 4.63 MIL/uL (ref 4.22–5.81)
RDW: 18.1 % — ABNORMAL HIGH (ref 11.5–15.5)
WBC: 8.8 10*3/uL (ref 4.0–10.5)

## 2018-04-29 LAB — BASIC METABOLIC PANEL
Anion gap: 12 (ref 5–15)
BUN: 28 mg/dL — ABNORMAL HIGH (ref 6–20)
CALCIUM: 9 mg/dL (ref 8.9–10.3)
CHLORIDE: 97 mmol/L — AB (ref 98–111)
CO2: 35 mmol/L — AB (ref 22–32)
CREATININE: 1.12 mg/dL (ref 0.61–1.24)
GFR calc Af Amer: >60 mL/min (ref >60)
GFR calc non Af Amer: >60 mL/min (ref >60)
GLUCOSE: 104 mg/dL — AB (ref 70–99)
Potassium: 4.5 mmol/L (ref 3.5–5.1)
Sodium: 144 mmol/L (ref 135–145)

## 2018-04-29 LAB — GLUCOSE, CAPILLARY
GLUCOSE-CAPILLARY: 110 mg/dL — AB (ref 70–99)
GLUCOSE-CAPILLARY: 123 mg/dL — AB (ref 70–99)
Glucose-Capillary: 129 mg/dL — ABNORMAL HIGH (ref 70–99)
Glucose-Capillary: 148 mg/dL — ABNORMAL HIGH (ref 70–99)

## 2018-04-29 LAB — SEDIMENTATION RATE: Sed Rate: 50 mm/hr — ABNORMAL HIGH (ref 0–16)

## 2018-04-29 LAB — MRSA PCR SCREENING: MRSA BY PCR: NEGATIVE

## 2018-04-29 MED ORDER — METHYLPREDNISOLONE SODIUM SUCC 125 MG IJ SOLR
60.0000 mg | Freq: Four times a day (QID) | INTRAMUSCULAR | Status: DC
  Administered 2018-04-29 – 2018-05-01 (×7): 60 mg via INTRAVENOUS
  Filled 2018-04-29 (×7): qty 2

## 2018-04-29 MED ORDER — INSULIN ASPART 100 UNIT/ML ~~LOC~~ SOLN
0.0000 [IU] | Freq: Three times a day (TID) | SUBCUTANEOUS | Status: DC
  Administered 2018-04-29: 2 [IU] via SUBCUTANEOUS
  Administered 2018-04-30: 3 [IU] via SUBCUTANEOUS
  Administered 2018-04-30: 2 [IU] via SUBCUTANEOUS
  Administered 2018-04-30 – 2018-05-01 (×2): 3 [IU] via SUBCUTANEOUS
  Administered 2018-05-01 (×2): 8 [IU] via SUBCUTANEOUS
  Administered 2018-05-02: 11 [IU] via SUBCUTANEOUS
  Administered 2018-05-02: 3 [IU] via SUBCUTANEOUS
  Administered 2018-05-02: 11 [IU] via SUBCUTANEOUS
  Administered 2018-05-03: 2 [IU] via SUBCUTANEOUS
  Administered 2018-05-03: 5 [IU] via SUBCUTANEOUS

## 2018-04-29 MED ORDER — FUROSEMIDE 10 MG/ML IJ SOLN
40.0000 mg | Freq: Two times a day (BID) | INTRAMUSCULAR | Status: DC
  Administered 2018-04-29 – 2018-05-02 (×6): 40 mg via INTRAVENOUS
  Filled 2018-04-29 (×6): qty 4

## 2018-04-29 MED ORDER — ENOXAPARIN SODIUM 80 MG/0.8ML ~~LOC~~ SOLN
80.0000 mg | SUBCUTANEOUS | Status: DC
  Administered 2018-04-29 – 2018-05-02 (×4): 80 mg via SUBCUTANEOUS
  Filled 2018-04-29 (×4): qty 0.8

## 2018-04-29 MED ORDER — FUROSEMIDE 10 MG/ML IJ SOLN
40.0000 mg | Freq: Four times a day (QID) | INTRAMUSCULAR | Status: DC
  Administered 2018-04-29: 40 mg via INTRAVENOUS
  Filled 2018-04-29: qty 4

## 2018-04-29 MED ORDER — WHITE PETROLATUM EX OINT
TOPICAL_OINTMENT | CUTANEOUS | Status: AC
  Administered 2018-04-29: 15:00:00
  Filled 2018-04-29: qty 28.35

## 2018-04-29 MED ORDER — METHYLPREDNISOLONE SODIUM SUCC 125 MG IJ SOLR: 60.0000 mg | Freq: Two times a day (BID) | INTRAMUSCULAR | Status: DC

## 2018-04-29 NOTE — Progress Notes (Signed)
NAME:  [name], MRN:  [id number], DOB:  01-Nov-1995, LOS: 4 ADMISSION DATE:  04/25/2018, CONSULTATION DATE:  04/26/2018 REFERRING MD:  Dr. Manson Passey, FPTS CHIEF COMPLAINT:  Short of breath   Brief History   22 yo male with hx of Prader Willi syndrome with morbid obesity and sleep disordered breathing on 2 liters oxygen and Trilogy home vent presented with shortness of breath, hypoxia, cough, sputum, and swelling.  Tx for pulmonary edema and CAP.  He is followed by Dr. Delton Coombes in pulmonary office.  Past Medical History Raynaud's phenomenon, DM, Chronic diastolic CHF, Asthma  Significant Hospital Events   9/21 Admit 9/24 Change to ASV mode for Trilogy due to oxygen desaturation and increased WOB 9/25 ABG worse this AM  Consults: date of consult/date signed off & final recs:   Procedures (surgical and bedside):   Significant Diagnostic Tests:  CT angio chest 9/21 > b/l airspace opacities Rt > Lt Echo 9/22 >> EF 60 to 65%, mild LVH, poor window  Micro Data: Blood 9/21 >>   Antimicrobials:  Rocephin 9/22 >> Zithromax 9/22 >>   Subjective:  Tolerated NIMV overnight, but ABG unfortunately worse. Reassuring he is alert and conversant.   Objective   Blood pressure (!) 141/90, pulse (!) 105, temperature 98 F (36.7 C), temperature source Axillary, resp. rate (!) 23, height 4\' 7"  (1.397 m), weight (!) 162.8 kg, SpO2 92 %.    FiO2 (%):  [40 %-70 %] 60 %   Intake/Output Summary (Last 24 hours) at 04/29/2018 1135 Last data filed at 04/28/2018 2130 Gross per 24 hour  Intake -  Output 2300 ml  Net -2300 ml   Filed Weights   04/27/18 2201 04/28/18 0630 04/29/18 1024  Weight: (!) 164 kg (!) 164 kg (!) 162.8 kg    Examination: General:  Morbidly obese young adult male in NAD Neuro:  Alert, conversant.  HEENT:  [place]/AT, PERRL, unable to appreciate JVD Cardiovascular:  RRR, no MRG Lungs:  Clear distant breath sounds.  Abdomen:  Soft, non-distended, non-tender Musculoskeletal:  No  acute deformity Skin:  Intact, MMM   Assessment & Plan:   Acute on chronic hypoxic/hypercapnic respiratory failure from OSA/OHS with acute on chronic diastolic CHF and possible CAP. OSA and OHS secondary to morbid obesity due to Jeanella Cara. Currently on NIMV AVAPS. Has been on non-invasive for days at this point. Not meeting tidal volume goals. Diuresed well after 140mg  total lasix yesterday. Unfortunately ABG worse.  - AVAPS  Vt 400, and adjust IPAP/EPAP (Current pMax 30) based on ABG and SpO2 - Decrease lasix frequency due to creat bump.  - day 4/5 of CTX and azithro. Doubt this is pneumonia, but should finish course at this point.  - SpO2 goal 88-95% - Follow ABG.   Diabetes Mellitus - CBG monitoring and SSI  Acute on chronic HFpEF - Diuresis as above - Continue amlodipine, lasix as above  AKI - Small bump in creatinine today. Monitor closely with ongoing diuresis.   Asthma - continue singulair, claritin, flonase, prn BDs  Nocturnal Enuresis, chronic - Continue desmopressin, holding oxybutynin  Disposition / Summary of Today's Plan 04/29/18    Suspect any further changes we could make to his Non-invasive would be ineffective or unsafe given the increased pressures he is on. Unfortunately it is time to take him to ICU for closer monitoring, where he will likely need intubation and eventual tracheostomy.     Diet: Heart healthy, carb modified DVT prophylaxis: Lovenox GI prophylaxis: protonix  Hyperglycemia protocol: SSI Mobility: As tolerated Code Status: Full code Family Communication: mother updated bedside.   Labs   CBC: Recent Labs  Lab 04/25/18 1302 04/26/18 0619 04/27/18 0729 04/29/18 0539  WBC 13.2* 8.6 8.5 8.8  NEUTROABS 10.0*  --   --   --   HGB 12.3* 10.8* 10.2* 10.1*  HCT 42.0 37.5* 35.4* 35.8*  MCV 75.5* 75.9* 75.8* 77.3*  PLT 270 235 252 254   Basic Metabolic Panel: Recent Labs  Lab 04/25/18 1302 04/26/18 0450 04/27/18 0729 04/28/18 0324  04/29/18 0539  NA 140 145 144 144 144  K 3.8 4.2 4.1 4.6 4.5  CL 94* 100 97* 97* 97*  CO2 32 28 36* 30 35*  GLUCOSE 239* 110* 101* 135* 104*  BUN 28* 26* 24* 27* 28*  CREATININE 1.11 1.01 0.95 1.00 1.12  CALCIUM 8.7* 8.8* 9.1 9.1 9.0   GFR: Estimated Creatinine Clearance: 126.9 mL/min (by C-G formula based on SCr of 1.12 mg/dL). Recent Labs  Lab 04/25/18 1302 04/25/18 1312 04/26/18 0619 04/26/18 1451 04/27/18 0729 04/28/18 0653 04/29/18 0539  PROCALCITON  --   --   --  0.19 1.36 0.11  --   WBC 13.2*  --  8.6  --  8.5  --  8.8  LATICACIDVEN  --  1.17  --   --   --   --   --    Liver Function Tests: Recent Labs  Lab 04/25/18 1302 04/28/18 0324  AST 39 31  ALT 30 20  ALKPHOS 160* 126  BILITOT 1.5* 1.5*  PROT 7.7 6.8  ALBUMIN 3.3* 2.6*   No results for input(s): LIPASE, AMYLASE in the last 168 hours. No results for input(s): AMMONIA in the last 168 hours. ABG    Component Value Date/Time   PHART 7.298 (L) 04/29/2018 0535   PCO2ART 85.6 (HH) 04/29/2018 0535   PO2ART 79.1 (L) 04/29/2018 0535   HCO3 40.6 (H) 04/29/2018 0535   TCO2 44 (H) 04/25/2018 1309   O2SAT 94.6 04/29/2018 0535    Coagulation Profile: No results for input(s): INR, PROTIME in the last 168 hours. Cardiac Enzymes: Recent Labs  Lab 04/25/18 1905 04/26/18 0450 04/26/18 1022  TROPONINI <0.03 <0.03 <0.03   HbA1C: HbA1c, POC (controlled diabetic range)  Date/Time Value Ref Range Status  03/10/2018 02:10 PM 7.2 (A) 0.0 - 7.0 % Final   Hgb A1c MFr Bld  Date/Time Value Ref Range Status  08/28/2017 06:48 PM 7.4 (H) 4.8 - 5.6 % Final    Comment:    (NOTE) Pre diabetes:          5.7%-6.4% Diabetes:              >6.4% Glycemic control for   <7.0% adults with diabetes   02/27/2017 07:32 AM 6.6 (H) 4.8 - 5.6 % Final    Comment:    (NOTE)         Pre-diabetes: 5.7 - 6.4         Diabetes: >6.4         Glycemic control for adults with diabetes: <7.0    CBG: Recent Labs  Lab 04/28/18 0753  04/28/18 1208 04/28/18 1653 04/28/18 2109 04/29/18 0742  GLUCAP 92 149* 107* 137* 110*    Joneen Taulbee, AGACNP-BC [facility] Pager [phone] or [phone]  04/29/2018 11:50 AM

## 2018-04-29 NOTE — Progress Notes (Signed)
Transferred to ICU. Remains on Bipap.  Was in hospital with respiratory failure and pulmonary infiltrates in January 2019.  CT imaging then looks similar to findings now.  Respiratory status appears to have improved with steroids, although family reports he had acute delirium then.  He has not improved with antibiotics or diuresis.  Will check serology and ESR.  Will add solumedrol 60 mg q6h.    Updated pt's mother about plan.  Chesley Mires, MD [facility] 04/29/2018, 3:08 PM

## 2018-04-29 NOTE — Progress Notes (Signed)
Critical blood gas results called to RN.

## 2018-04-29 NOTE — Progress Notes (Signed)
Family Medicine Teaching Service Daily Progress Note Intern Pager: [phone]  Patient name: [name] Medical record number: [id number] Date of birth: June 16, 1996 Age: 22 y.o. Gender: male  Primary Care Provider: Dollene Cleveland, DO Consultants: pulmonology Code Status: Full code  Pt Overview and Major Events to Date:  Hospital Day 4 Admitted: 04/25/2018   Assessment and Plan: Legrande Hao Roachis a 22 y.o.malepresenting with shortness of breath secondary to flash pulmonary edema.  PMH is significant forPrader-Willi Syndrome, Morbid Obesity, OHS, HTN, Chronic Respiratory Failure, Raynaud's Syndrome, Asthma, T2DM, Chronic Respiratory Failure, and Gout.  #Acute respiratory failurelikely 2/2 pna, improving Patient respiratory status appears to be worsening today. Trilogy mode changed to AVAPS yesterday. ABG showed increasing pco2 to 90.9. Worsening of CHF/pnemonia seen on CXR.  8pm abg improved showed pCO2 76.4 and pH 7.33 but this morning worsened to 7.29 and pco2 85.6. Patient negative for legionella and pneumococcus.  Echo showed 60-65% EF. Blood cx. Negative to date.   Consult pulmonology on patient's worsening status.   Consider broadening antibiotic coverage w/ vanc and zosyn  I&O,Daily Weights, daily BMP - out on 9/24.   Lasix 40 BID, monitor fluid status to see if increase is necessary.    ABx for CAPas below  Placed on home Trilogy vent with goal to wean to use at night only.   #CAP, acute  Meds/Therapies ? Startedon ceftriaxone every 24 hours, . All antibioticsstarted 9/22 ? Last day of zithromax today.   Labs/studies ? Procalcitonin 0.18> 1.36 > .11 CRP 16.5 per CC  # headache - patient developed headache this morning.   - hold oxycodone given pt respiratory status.   #Prader Johnnette Gourd Syndrome   Meds/Therapies  ? Placed on home Trilogy vent from home. Appreciate RT management. BiPAP overnight.   #Asthma and Allergies  Restartedhome  singular, flonase. loratidine and pulmicort as formulary alternatives for cetirizine and flovent while admitted  duonebsPRN  #HTN, stable  restart home amlodipine  monitor BPs  #T2DM,chronic HbA1c Aug 2019 7.2% down from 7.4%.At home onMetformin-XR 500mg  daily.  mSSI  monitor CBGs  #Iron Deficiencyanemia, chronic  stable  10.2 this morning.Hgb at baseline ~10.5. Last iron 15, ferritin 27 and sat 4 on 08/28/17 suggesting iron deficiency anemia.  consider iron supplementation as outpt  #Gout, chronic takes colchicine 0.6mg  and allopurinol 100mg  dailyat home  restart home allopurinol  #Nocturnal enuresis, chronic Desmopressin 0.4mg  qHS and Oxybutynin 10mg  qHS,   restart home desmopressin  Holdingoxybutyninwith diuresis.  #GERD, chronic Protonix 20mg  daily  restart home protonix  FEN/GI:heart healthy diet, Miralaxprn Prophylaxis:Lovenox  Disposition:stepdown  Medications: Scheduled Meds: . amLODipine  10 mg Oral Daily  . budesonide  0.25 mg Nebulization BID  . desmopressin  0.4 mg Oral QHS  . enoxaparin (LOVENOX) injection  40 mg Subcutaneous Q24H  . fluticasone  2 spray Each Nare Daily  . furosemide  40 mg Intravenous Q12H  . Influenza vac split quadrivalent PF  0.5 mL Intramuscular Tomorrow-1000  . insulin aspart  0-15 Units Subcutaneous TID WC  . loratadine  10 mg Oral Daily  . montelukast  10 mg Oral QHS  . pantoprazole  20 mg Oral Daily   Continuous Infusions: . cefTRIAXone (ROCEPHIN)  IV 1 g (04/28/18 1633)   PRN Meds: acetaminophen **OR** acetaminophen, ipratropium-albuterol, polyethylene glycol  ================================================= ================================================= Subjective:  Spoke with pt mother this morning.  She is very concerned about her son's respiratory status and how it appears to be worsening, especially the co2 levels and his  increasing FiO2.  States patient could not tolerate  being off the [facility] for the full thirty minutes last night and that he has a headache today.    Objective: Temp:  [98 F (36.7 C)-100.1 F (37.8 C)] 99.3 F (37.4 C) (09/25 1324) Pulse Rate:  [96-116] 104 (09/25 1233) Resp:  [23-33] 25 (09/25 1233) BP: (129-159)/(88-98) 142/96 (09/25 1233) SpO2:  [87 %-99 %] 95 % (09/25 1233) FiO2 (%):  [40 %-70 %] 60 % (09/25 1233) Weight:  [162.8 kg] 162.8 kg (09/25 1024) Intake/Output 09/24 0701 - 09/25 0700 In: -  Out: 2300 [Urine:2300] Physical Exam:  Gen: alert, laying in bed, on trilogy machine.   HEENT: Normocephaic, atraumatic.  CV: Regular rate and rhythm.  Bilateral leg swelling, non-pitting.  Resp: difficult to auscultate due to body habitus.  Did not hear wheezes or crackles.  Psych: Cooperative with exam. Pleasant. Makes eye contact.   Laboratory: Recent Labs  Lab 04/26/18 0619 04/27/18 0729 04/29/18 0539  WBC 8.6 8.5 8.8  HGB 10.8* 10.2* 10.1*  HCT 37.5* 35.4* 35.8*  PLT 235 252 254   Recent Labs  Lab 04/25/18 1302  04/27/18 0729 04/28/18 0324 04/29/18 0539  NA 140   < > 144 144 144  K 3.8   < > 4.1 4.6 4.5  CL 94*   < > 97* 97* 97*  CO2 32   < > 36* 30 35*  BUN 28*   < > 24* 27* 28*  CREATININE 1.11   < > 0.95 1.00 1.12  CALCIUM 8.7*   < > 9.1 9.1 9.0  PROT 7.7  --   --  6.8  --   BILITOT 1.5*  --   --  1.5*  --   ALKPHOS 160*  --   --  126  --   ALT 30  --   --  20  --   AST 39  --   --  31  --   GLUCOSE 239*   < > 101* 135* 104*   < > = values in this interval not displayed.    Imaging/Diagnostic Tests: Dg Chest Port 1 View  Result Date: 04/28/2018 CLINICAL DATA:  Increasing dyspnea this evening EXAM: PORTABLE CHEST 1 VIEW COMPARISON:  04/25/2018 CXR and CT FINDINGS: Worsening of bilateral airspace disease, more confluent in appearance and now extending and involving more of the left upper lobe as well. Stable cardiomegaly. No acute osseous abnormality. IMPRESSION: Progression of bilateral airspace  disease suggesting worsening CHF and/or pneumonia. Stable cardiomegaly. Electronically Signed   By: Tollie Eth M.D.   On: 04/28/2018 23:54      Sandre Kitty, MD 04/29/2018, 1:59 PM PGY-1, [facility] Family Medicine FPTS Intern pager: [phone], text pages welcome

## 2018-04-29 NOTE — Plan of Care (Signed)
Discussed plan of care for the evening with patient and his mother.  Patient only provides simple, one-word answers after being prompted by his mother. Stressed the importance of wearing the BiPAP as much as possible.  Also encouraged patient to "blow"  as much air as possible out of his lungs when wearing the high-flow nasal cannula.  Minimal teach back displayed.

## 2018-04-30 ENCOUNTER — Inpatient Hospital Stay (HOSPITAL_COMMUNITY): Payer: Medicaid Other

## 2018-04-30 DIAGNOSIS — J9600 Acute respiratory failure, unspecified whether with hypoxia or hypercapnia: Secondary | ICD-10-CM

## 2018-04-30 LAB — CULTURE, BLOOD (ROUTINE X 2)
CULTURE: NO GROWTH
CULTURE: NO GROWTH
SPECIAL REQUESTS: ADEQUATE

## 2018-04-30 LAB — SJOGRENS SYNDROME-A EXTRACTABLE NUCLEAR ANTIBODY: SSA (Ro) (ENA) Antibody, IgG: <0.2 AI (ref 0.0–0.9)

## 2018-04-30 LAB — ANGIOTENSIN CONVERTING ENZYME: ANGIOTENSIN-CONVERTING ENZYME: 63 U/L (ref 14–82)

## 2018-04-30 LAB — ANTINUCLEAR ANTIBODIES, IFA: ANTINUCLEAR ANTIBODIES, IFA: NEGATIVE

## 2018-04-30 LAB — ANTI-SCLERODERMA ANTIBODY: Scleroderma (Scl-70) (ENA) Antibody, IgG: <0.2 AI (ref 0.0–0.9)

## 2018-04-30 LAB — RHEUMATOID FACTOR: Rhuematoid fact SerPl-aCnc: 17 IU/mL — ABNORMAL HIGH (ref 0.0–13.9)

## 2018-04-30 LAB — SJOGRENS SYNDROME-B EXTRACTABLE NUCLEAR ANTIBODY: SSB (La) (ENA) Antibody, IgG: <0.2 AI (ref 0.0–0.9)

## 2018-04-30 LAB — HIV ANTIBODY (ROUTINE TESTING W REFLEX): HIV Screen 4th Generation wRfx: NONREACTIVE

## 2018-04-30 LAB — GLOMERULAR BASEMENT MEMBRANE ANTIBODIES: GBM Ab: 4 units (ref 0–20)

## 2018-04-30 LAB — GLUCOSE, CAPILLARY
GLUCOSE-CAPILLARY: 145 mg/dL — AB (ref 70–99)
GLUCOSE-CAPILLARY: 191 mg/dL — AB (ref 70–99)
GLUCOSE-CAPILLARY: 248 mg/dL — AB (ref 70–99)
Glucose-Capillary: 198 mg/dL — ABNORMAL HIGH (ref 70–99)

## 2018-04-30 LAB — ANTI-DNA ANTIBODY, DOUBLE-STRANDED

## 2018-04-30 NOTE — Progress Notes (Signed)
NAME:  [name], MRN:  [id number], DOB:  May 09, 1996, LOS: 5 ADMISSION DATE:  04/25/2018, CONSULTATION DATE:  04/26/2018 REFERRING MD:  Dr. Manson Passey, FPTS CHIEF COMPLAINT:  Short of breath   Brief History   22 yo male with hx of Prader Willi syndrome with morbid obesity and sleep disordered breathing on 2 liters oxygen and Trilogy home vent presented with shortness of breath, hypoxia, cough, sputum, and swelling.  Tx for pulmonary edema and CAP.  He is followed by Dr. Delton Coombes in pulmonary office.  Past Medical History Raynaud's phenomenon, DM, Chronic diastolic CHF, Asthma  Significant Hospital Events   9/21 Admit 9/24 Change to ASV mode for Trilogy due to oxygen desaturation and increased WOB 9/25 ABG worse this AM 04/30/2018 more awake FiO2 needs decreased negative I&O  Consults: date of consult/date signed off & final recs:   Procedures (surgical and bedside):   Significant Diagnostic Tests:  CT angio chest 9/21 > b/l airspace opacities Rt > Lt Echo 9/22 >> EF 60 to 65%, mild LVH, poor window  Micro Data: Blood 9/21 >> negative  Antimicrobials:  Rocephin 9/22 >> Zithromax 9/22 >>   Subjective:   FiO2 needs have decreased Responded to diuresis Awake alert  Objective   Blood pressure (!) 150/99, pulse (!) 108, temperature 98.6 F (37 C), temperature source Axillary, resp. rate (!) 25, height 4\' 7"  (1.397 m), weight (!) 162.7 kg, SpO2 99 %.    FiO2 (%):  [60 %-70 %] 70 %   Intake/Output Summary (Last 24 hours) at 04/30/2018 0800 Last data filed at 04/30/2018 0600 Gross per 24 hour  Intake 199.86 ml  Output 2550 ml  Net -2350.14 ml   Filed Weights   04/28/18 0630 04/29/18 1024 04/30/18 0304  Weight: (!) 164 kg (!) 162.8 kg (!) 162.7 kg    Examination: General: Obese male who is awake alert follows commands HEENT: MM pink/moist Neuro: Awake alert follows commands.  Childlike affect CV: Sounds are regular PULM: Breath sounds throughout WU:JWJX, non-tender, bsx4  active  Extremities: warm/dry, will detailed if he has edema body habitus Skin: no rashes or lesions    Assessment & Plan:   Acute on chronic hypoxic/hypercapnic respiratory failure from OSA/OHS with acute on chronic diastolic CHF and possible CAP. OSA and OHS secondary to morbid obesity due to Jeanella Cara.  He is currently on noninvasive mechanical mechanical ventilatory support.  Today 04/30/2018 we will try to transition him off BiPAP and onto high flow nasal cannula or facemask.  Is a -2 L for 24 hours.  He continues to diurese.  He is now on steroids and antibiotic biotics for his pneumonia.  He may be able to avoid intubation on this admission but this will depend on how he responds to current treatment -Changed noninvasive mechanical ventilatory support to nightly and PRN -Continue antimicrobial therapy today as a day 5 of 5 -Continue diuresis as tolerated creatinine remains stable -Decrease O2 saturation goal to less than 94% greater than 90% -Repeat chest x-ray  Diabetes Mellitus CBG (last 3)  Recent Labs    04/29/18 1322 04/29/18 1638 04/29/18 2213  GLUCAP 129* 123* 148*   -Continue sliding scale insulin, note he is on steroids   Acute on chronic HFpEF  Intake/Output Summary (Last 24 hours) at 04/30/2018 0803 Last data filed at 04/30/2018 0600 Gross per 24 hour  Intake 199.86 ml  Output 2550 ml  Net -2350.14 ml   -Diuresis as tolerated note he is a -2 L for  24 hours -Continue antihypertensives  AKI Lab Results  Component Value Date   CREATININE 1.12 04/29/2018   CREATININE 1.00 04/28/2018   CREATININE 0.95 04/27/2018   CREATININE 1.00 03/06/2016   CREATININE 1.09 03/08/2015   CREATININE 0.83 06/30/2013    -Monitor creatinine while undergoing diuresis  Asthma -Continue home Singulair and Claritin and bronchodilators.  Nocturnal Enuresis, chronic - Continue desmopressin, holding oxybutynin  Disposition / Summary of Today's Plan 04/30/18   04/30/2018  have decrease his FiO2 to 50% on noninvasive mechanical ventilatory support. We will attempt to place him on high flow oxygen sometimes during the day and mobilize to chair. Flu we have avoided intubation and tracheostomy at this time although there is no doubt that is coming in the future. We will recheck a chest x-ray today for completeness.    Diet: Heart healthy, carb modified DVT prophylaxis: Lovenox GI prophylaxis: protonix Hyperglycemia protocol: SSI Mobility: As tolerated Code Status: Full code Family Communication: 04/30/2018 extended time spent with the mother.  She feels that people are not listening to her.  Spent time explaining that we value her input we know that she does her child better than any of this.  I did explain there is a strong possibility he will be intubated either this admission or in the near future and will require tracheostomy.  Labs   CBC: Recent Labs  Lab 04/25/18 1302 04/26/18 0619 04/27/18 0729 04/29/18 0539  WBC 13.2* 8.6 8.5 8.8  NEUTROABS 10.0*  --   --   --   HGB 12.3* 10.8* 10.2* 10.1*  HCT 42.0 37.5* 35.4* 35.8*  MCV 75.5* 75.9* 75.8* 77.3*  PLT 270 235 252 254   Basic Metabolic Panel: Recent Labs  Lab 04/25/18 1302 04/26/18 0450 04/27/18 0729 04/28/18 0324 04/29/18 0539  NA 140 145 144 144 144  K 3.8 4.2 4.1 4.6 4.5  CL 94* 100 97* 97* 97*  CO2 32 28 36* 30 35*  GLUCOSE 239* 110* 101* 135* 104*  BUN 28* 26* 24* 27* 28*  CREATININE 1.11 1.01 0.95 1.00 1.12  CALCIUM 8.7* 8.8* 9.1 9.1 9.0   GFR: Estimated Creatinine Clearance: 126.9 mL/min (by C-G formula based on SCr of 1.12 mg/dL). Recent Labs  Lab 04/25/18 1302 04/25/18 1312 04/26/18 0619 04/26/18 1451 04/27/18 0729 04/28/18 0653 04/29/18 0539  PROCALCITON  --   --   --  0.19 1.36 0.11  --   WBC 13.2*  --  8.6  --  8.5  --  8.8  LATICACIDVEN  --  1.17  --   --   --   --   --    Liver Function Tests: Recent Labs  Lab 04/25/18 1302 04/28/18 0324  AST 39 31  ALT  30 20  ALKPHOS 160* 126  BILITOT 1.5* 1.5*  PROT 7.7 6.8  ALBUMIN 3.3* 2.6*   No results for input(s): LIPASE, AMYLASE in the last 168 hours. No results for input(s): AMMONIA in the last 168 hours. ABG    Component Value Date/Time   PHART 7.298 (L) 04/29/2018 0535   PCO2ART 85.6 (HH) 04/29/2018 0535   PO2ART 79.1 (L) 04/29/2018 0535   HCO3 40.6 (H) 04/29/2018 0535   TCO2 44 (H) 04/25/2018 1309   O2SAT 94.6 04/29/2018 0535    Coagulation Profile: No results for input(s): INR, PROTIME in the last 168 hours. Cardiac Enzymes: Recent Labs  Lab 04/25/18 1905 04/26/18 0450 04/26/18 1022  TROPONINI <0.03 <0.03 <0.03   HbA1C: HbA1c, POC (controlled diabetic range)  Date/Time Value Ref Range Status  03/10/2018 02:10 PM 7.2 (A) 0.0 - 7.0 % Final   Hgb A1c MFr Bld  Date/Time Value Ref Range Status  08/28/2017 06:48 PM 7.4 (H) 4.8 - 5.6 % Final    Comment:    (NOTE) Pre diabetes:          5.7%-6.4% Diabetes:              >6.4% Glycemic control for   <7.0% adults with diabetes   02/27/2017 07:32 AM 6.6 (H) 4.8 - 5.6 % Final    Comment:    (NOTE)         Pre-diabetes: 5.7 - 6.4         Diabetes: >6.4         Glycemic control for adults with diabetes: <7.0    CBG: Recent Labs  Lab 04/28/18 2109 04/29/18 0742 04/29/18 1322 04/29/18 1638 04/29/18 2213  GLUCAP 137* 110* 129* 123* 148*    cct 30 min   Brett Canales Minor ACNP [facility] Pager [phone] till 1 pm If no answer page [phone] 04/30/2018, 8:00 AM

## 2018-04-30 NOTE — Progress Notes (Signed)
FPTS Social Progress Note  Family Medicine teaching service following along socially for continuity of care.  Will pick up service when released from the ICU.  Appreciate the care by CCM.    Sandre Kitty, MD 04/30/2018, 1:32 PM PGY-1, [facility] Family Medicine Service pager [phone]

## 2018-05-01 ENCOUNTER — Inpatient Hospital Stay (HOSPITAL_COMMUNITY): Payer: Medicaid Other

## 2018-05-01 LAB — BASIC METABOLIC PANEL
Anion gap: 9 (ref 5–15)
BUN: 56 mg/dL — ABNORMAL HIGH (ref 6–20)
CALCIUM: 9.4 mg/dL (ref 8.9–10.3)
CHLORIDE: 94 mmol/L — AB (ref 98–111)
CO2: 39 mmol/L — ABNORMAL HIGH (ref 22–32)
CREATININE: 1.09 mg/dL (ref 0.61–1.24)
Glucose, Bld: 221 mg/dL — ABNORMAL HIGH (ref 70–99)
Potassium: 4.6 mmol/L (ref 3.5–5.1)
SODIUM: 142 mmol/L (ref 135–145)

## 2018-05-01 LAB — MAGNESIUM: MAGNESIUM: 2.2 mg/dL (ref 1.7–2.4)

## 2018-05-01 LAB — GLUCOSE, CAPILLARY
Glucose-Capillary: 196 mg/dL — ABNORMAL HIGH (ref 70–99)
Glucose-Capillary: 269 mg/dL — ABNORMAL HIGH (ref 70–99)
Glucose-Capillary: 295 mg/dL — ABNORMAL HIGH (ref 70–99)
Glucose-Capillary: 253 mg/dL — ABNORMAL HIGH (ref 70–99)
Glucose-Capillary: 214 mg/dL — ABNORMAL HIGH (ref 70–99)

## 2018-05-01 LAB — MPO/PR-3 (ANCA) ANTIBODIES: Myeloperoxidase Abs: <9 U/mL (ref 0.0–9.0)

## 2018-05-01 LAB — PHOSPHORUS: PHOSPHORUS: 4.8 mg/dL — AB (ref 2.5–4.6)

## 2018-05-01 MED ORDER — METHYLPREDNISOLONE SODIUM SUCC 125 MG IJ SOLR
60.0000 mg | Freq: Two times a day (BID) | INTRAMUSCULAR | Status: DC
  Administered 2018-05-01 – 2018-05-02 (×2): 60 mg via INTRAVENOUS
  Filled 2018-05-01 (×2): qty 2

## 2018-05-01 MED ORDER — INSULIN GLARGINE 100 UNIT/ML ~~LOC~~ SOLN
5.0000 [IU] | Freq: Every day | SUBCUTANEOUS | Status: DC
  Administered 2018-05-01 – 2018-05-03 (×3): 5 [IU] via SUBCUTANEOUS
  Filled 2018-05-01 (×3): qty 0.05

## 2018-05-01 NOTE — Progress Notes (Signed)
NAME:  [name], MRN:  [id number], DOB:  Sep 19, 1995, LOS: 6 ADMISSION DATE:  04/25/2018, CONSULTATION DATE:  04/26/2018 REFERRING MD:  Dr. Manson Passey, FPTS CHIEF COMPLAINT:  Short of breath   Brief History   22 yo male with hx of Prader Willi syndrome with morbid obesity and sleep disordered breathing on 2 liters oxygen and Trilogy home vent presented with shortness of breath, hypoxia, cough, sputum, and swelling.  Tx for pulmonary edema and CAP.  He is followed by Dr. Delton Coombes in pulmonary office.  Past Medical History Raynaud's phenomenon, DM, Chronic diastolic CHF, Asthma  Significant Hospital Events   9/21 Admit 9/24 Change to ASV mode for Trilogy due to oxygen desaturation and increased WOB 9/25 ABG worse this AM 04/30/2018 more awake FiO2 needs decreased negative I&O 927 transfer to stepdown unit  Consults: date of consult/date signed off & final recs:   Procedures (surgical and bedside):   Significant Diagnostic Tests:  CT angio chest 9/21 > b/l airspace opacities Rt > Lt Echo 9/22 >> EF 60 to 65%, mild LVH, poor window  Micro Data: Blood 9/21 >> negative  Antimicrobials:  Rocephin 9/22 >> 9/27 Zithromax 9/22 >>   Subjective:   Continues to wean FiO2 Steroids been decreased to every 12 Diuresis is continued He is being moved out to stepdown unit Teaching service to assume his care  Objective   Blood pressure 137/88, pulse 77, temperature 98.5 F (36.9 C), temperature source Axillary, resp. rate (!) 26, height 4\' 7"  (1.397 m), weight (!) 162.7 kg, SpO2 98 %.    FiO2 (%):  [50 %-60 %] 60 %   Intake/Output Summary (Last 24 hours) at 05/01/2018 4098 Last data filed at 05/01/2018 0600 Gross per 24 hour  Intake 730 ml  Output 2025 ml  Net -1295 ml   Filed Weights   04/28/18 0630 04/29/18 1024 04/30/18 0304  Weight: (!) 164 kg (!) 162.8 kg (!) 162.7 kg    Examination: General: Morbidly obese male in no acute distress sitting in chair eating HEENT: No neck,  tracks and follows Neuro: Childlike but intact CV: s1s2 rrr, no m/r/g PULM: even/non-labored, lungs bilaterally diminished JX:BJYN, non-tender, bsx4 active  Extremities: warm/dry, difficult to ascertain due to body habitus if he has edema Skin: no rashes or lesions     Assessment & Plan:   Acute on chronic hypoxic/hypercapnic respiratory failure from OSA/OHS with acute on chronic diastolic CHF and possible CAP. OSA and OHS secondary to morbid obesity due to Jeanella Cara.  He is currently on noninvasive mechanical mechanical ventilatory support.  Today 04/30/2018 we will try to transition him off BiPAP and onto high flow nasal cannula or facemask.  Is a -2 L for 24 hours.  He continues to diurese.  He is now on steroids and antibiotic biotics for his pneumonia.  He may be able to avoid intubation on this admission but this will depend on how he responds to current treatment -Continue noninvasive mechanical ventilatory support as ordered nightly and PRN -He has completed antimicrobial therapy -Continue to wean oxygen down is currently on 8 L nasal cannula with sats greater than 95% as long as exacerbated 88% we will continue to wean down -Continue bronchodilators -Most important for him is weight loss and this was discussed with patient and mother at bedside.  Diabetes Mellitus CBG (last 3)  Recent Labs    04/30/18 1548 04/30/18 2303 05/01/18 0733  GLUCAP 198* 248* 196*   -Continue sliding scale insulin -Lantus ordered  05/01/2018 to the fact he is having steroid exacerbation of his glucose   Acute on chronic HFpEF  Intake/Output Summary (Last 24 hours) at 05/01/2018 7829 Last data filed at 05/01/2018 0600 Gross per 24 hour  Intake 730 ml  Output 2025 ml  Net -1295 ml   -Continue diuresis for at least 24 more hours he is a -3500 cc for the last 48 hours -Monitor electrolytes his potassium is greater than 4 therefore no supplemental potassium is given 05/01/2018  AKI Lab Results    Component Value Date   CREATININE 1.09 05/01/2018   CREATININE 1.12 04/29/2018   CREATININE 1.00 04/28/2018   CREATININE 1.00 03/06/2016   CREATININE 1.09 03/08/2015   CREATININE 0.83 06/30/2013    -Monitor creatinine while undergoing diuresis -05/01/2018 remains on Lasix every 12 hours this will need to be decreased to daily within 24 hours.  Note he is not on chronic diuretics for home medications.  Asthma -Continue home Singulair and Claritin and bronchodilators  Nocturnal Enuresis, chronic -Continue desmopressin  Disposition / Summary of Today's Plan 05/01/18   05/01/2018.  He has been titrated down to nasal cannula of 8 L.  He is awake alert sitting in a chair eating.  We will continue to titrate his FiO2 down.  Need nocturnal ventilatory support via mask nightly and PRN.  We have decreased his steroids to 60 mg every 12 every 6.  His will need to be transitioned to p.o. and tapered over 10-day.  His glucose is elevated he is currently on sliding scale insulin and Lantus has been added to his pharmaceutical regimen.    Diet: Heart healthy, carb modified DVT prophylaxis: Lovenox GI prophylaxis: protonix Hyperglycemia protocol: SSI plus Lantus Mobility: As tolerated Code Status: Full code Family Communication: 05/01/2018 again extended time spent with mother and patient.  He has avoided being intubated on this admission so far.  Again possibility of tracheostomy intubation was broached she firmly believes this will not happen in the future.  We discussed weight control issues as one way to prevent have been intubated in the future.  He is much improved with transfer to stepdown unit to the fact he is on noninvasive mechanical ventilatory support and back to the family practice teaching service.  Labs   CBC: Recent Labs  Lab 04/25/18 1302 04/26/18 0619 04/27/18 0729 04/29/18 0539  WBC 13.2* 8.6 8.5 8.8  NEUTROABS 10.0*  --   --   --   HGB 12.3* 10.8* 10.2* 10.1*  HCT 42.0  37.5* 35.4* 35.8*  MCV 75.5* 75.9* 75.8* 77.3*  PLT 270 235 252 254   Basic Metabolic Panel: Recent Labs  Lab 04/26/18 0450 04/27/18 0729 04/28/18 0324 04/29/18 0539 05/01/18 0414  NA 145 144 144 144 142  K 4.2 4.1 4.6 4.5 4.6  CL 100 97* 97* 97* 94*  CO2 28 36* 30 35* 39*  GLUCOSE 110* 101* 135* 104* 221*  BUN 26* 24* 27* 28* 56*  CREATININE 1.01 0.95 1.00 1.12 1.09  CALCIUM 8.8* 9.1 9.1 9.0 9.4  MG  --   --   --   --  2.2  PHOS  --   --   --   --  4.8*   GFR: Estimated Creatinine Clearance: 130.4 mL/min (by C-G formula based on SCr of 1.09 mg/dL). Recent Labs  Lab 04/25/18 1302 04/25/18 1312 04/26/18 0619 04/26/18 1451 04/27/18 0729 04/28/18 0653 04/29/18 0539  PROCALCITON  --   --   --  0.19 1.36 0.11  --  WBC 13.2*  --  8.6  --  8.5  --  8.8  LATICACIDVEN  --  1.17  --   --   --   --   --    Liver Function Tests: Recent Labs  Lab 04/25/18 1302 04/28/18 0324  AST 39 31  ALT 30 20  ALKPHOS 160* 126  BILITOT 1.5* 1.5*  PROT 7.7 6.8  ALBUMIN 3.3* 2.6*   No results for input(s): LIPASE, AMYLASE in the last 168 hours. No results for input(s): AMMONIA in the last 168 hours. ABG    Component Value Date/Time   PHART 7.298 (L) 04/29/2018 0535   PCO2ART 85.6 (HH) 04/29/2018 0535   PO2ART 79.1 (L) 04/29/2018 0535   HCO3 40.6 (H) 04/29/2018 0535   TCO2 44 (H) 04/25/2018 1309   O2SAT 94.6 04/29/2018 0535    Coagulation Profile: No results for input(s): INR, PROTIME in the last 168 hours. Cardiac Enzymes: Recent Labs  Lab 04/25/18 1905 04/26/18 0450 04/26/18 1022  TROPONINI <0.03 <0.03 <0.03   HbA1C: HbA1c, POC (controlled diabetic range)  Date/Time Value Ref Range Status  03/10/2018 02:10 PM 7.2 (A) 0.0 - 7.0 % Final   Hgb A1c MFr Bld  Date/Time Value Ref Range Status  08/28/2017 06:48 PM 7.4 (H) 4.8 - 5.6 % Final    Comment:    (NOTE) Pre diabetes:          5.7%-6.4% Diabetes:              >6.4% Glycemic control for   <7.0% adults with  diabetes   02/27/2017 07:32 AM 6.6 (H) 4.8 - 5.6 % Final    Comment:    (NOTE)         Pre-diabetes: 5.7 - 6.4         Diabetes: >6.4         Glycemic control for adults with diabetes: <7.0    CBG: Recent Labs  Lab 04/30/18 0725 04/30/18 1153 04/30/18 1548 04/30/18 2303 05/01/18 0733  GLUCAP 145* 191* 198* 248* 196*       Steve Olyvia Gopal ACNP [facility] Pager [phone] till 1 pm If no answer page [phone] 05/01/2018, 9:22 AM

## 2018-05-01 NOTE — Discharge Summary (Signed)
[facility] Discharge Summary  Patient name: [name] Medical record number: [id number] Date of birth: 20-May-1996 Age: 22 y.o. Gender: male Date of Admission: 04/25/2018  Date of Discharge: 05/03/18 Admitting Physician: Martyn Malay, MD  Primary Care Provider: Daisy Floro, DO Consultants: pulmonology  Indication for Hospitalization: acute respiratory failure, multifactorial.   Discharge Diagnoses/Problem List:  Acute respiratory failure Prader Ollen Barges syndrome Community acquired pneumonia Asthma HTN DM-II Iron deficiency anemia Gout Nocturnal enuresis  Disposition: home  Discharge Condition: stable  Discharge Exam:  General: NAD, pleasant, sitting up in chair Cardiovascular: RRR, no m/r/g, no LE edema Respiratory: CTA BL, normal work of breathing on 2 L [place] Gastrointestinal: soft, nontender, nondistended, normoactive BS MSK: moves 4 extremities equally Derm: no rashes appreciated Neuro: CN II-XII grossly intact Psych: Appropriate affect  Brief Hospital Course:  [name] a 22 y.o.malepresenting with multifactorial acute respiratory failure.  PMH is significant forPrader-Willi Syndrome, Morbid Obesity, OHS, HTN, Chronic Respiratory Failure, Raynaud's Syndrome, Asthma, T2DM, Chronic Respiratory Failure, and Gout.  Patient presented to ED in acute respiratory failure with flash pulmonary edema.  In the ED, patient was treated with 6L nasal cannula, before being advanced to non-rebreather and then BiPAP.   During admission, patient was treated with IV lasix and weaned to high flow nasal cannula.  Patient was also started on antibiotics, CTX and zithromax for community acquired pneumonia; received azithromycin 510m (9/22-9/24) and CTX (9/22-9/26). Patient's respiratory status started to worsen despite diuresis and antibiotics and was transferred to the ICU where he was started on IV steroids. Patient never required intubation.  After two days patient was transferred back to floor where he was switched to torsemide BID and put on oral prednisone.  Patient had a net output of 13.5 L during admission.    Issues for Follow Up:  1. Assess home oxygen requirements - patient usually has 2L [place] at home and trilogy at night.  Patient sent home on 1L [place].   2. Diuretics - patient was on 630mtorsemide daily.  Patient was diuresing at 4050masix BID and sent home on 76m18mrsemide BID.  3. Metformin - patient increased to 1000mg79mly from 500 on discharge because A1c was not at goal.  4. Prednisone - patient sent home on prednisone taper with decrease every other day until 10/8.   5. Patient will need pulmonology follow up.  6. Held HCTZ during admission for low BP's, may need to restart at follow up.  7. Held colchicine during admission and at discharge.   Significant Procedures:  none  Significant Labs and Imaging:  Recent Labs  Lab 04/27/18 0729 04/29/18 0539  WBC 8.5 8.8  HGB 10.2* 10.1*  HCT 35.4* 35.8*  PLT 252 254   Recent Labs  Lab 04/28/18 0324 04/29/18 0539 05/01/18 0414 05/02/18 0129 05/03/18 0300  NA 144 144 142 139 140  K 4.6 4.5 4.6 4.3 4.2  CL 97* 97* 94* 93* 93*  CO2 30 35* 39* 38* 35*  GLUCOSE 135* 104* 221* 209* 193*  BUN 27* 28* 56* 62* 55*  CREATININE 1.00 1.12 1.09 1.13 1.00  CALCIUM 9.1 9.0 9.4 9.0 9.1  MG  --   --  2.2  --   --   PHOS  --   --  4.8*  --   --   ALKPHOS 126  --   --   --   --   AST 31  --   --   --   --  ALT 20  --   --   --   --   ALBUMIN 2.6*  --   --   --   --    BNP 52.1 Angiotensin-Converting Enzyme 63 ANA, IFA negative Scleroderma (Scl-70) (ENA) Antibody, IgG <0.2 CRP 11.9  Glomerular basement membrane antibodies 4 HIV non-reactive Myeloperoxidase antibodies: <9.0 ANCA proteinase 3: <3.5 Rheumatoid factor 17.0 Sed rate 50 Sjogrens syndrome-A extractable nuclear antibody <0.2 Sjogrens syndrome-B extractable nuclear antibody <0.2 Hemoglobin A1c  7.2  Ct Angio Chest Pe W Or Wo Contrast  Result Date: 04/25/2018 CLINICAL DATA:  Shortness of breath. EXAM: CT ANGIOGRAPHY CHEST WITH CONTRAST TECHNIQUE: Multidetector CT imaging of the chest was performed using the standard protocol during bolus administration of intravenous contrast. Multiplanar CT image reconstructions and MIPs were obtained to evaluate the vascular anatomy. CONTRAST:  166m ISOVUE-370 IOPAMIDOL (ISOVUE-370) INJECTION 76% COMPARISON:  Chest x-ray today. FINDINGS: Cardiovascular: Heart is enlarged. Aorta is normal caliber. No filling defects in the pulmonary arteries to suggest pulmonary emboli. Mediastinum/Nodes: No mediastinal, hilar, or axillary adenopathy. Lungs/Pleura: Bilateral airspace opacities are noted, right greater than left. Trace right effusion. Upper Abdomen: Imaging into the upper abdomen shows no acute findings. Musculoskeletal: Chest wall soft tissues are unremarkable. No acute bony abnormality. Review of the MIP images confirms the above findings. IMPRESSION: Severe bilateral airspace opacities, right greater than left could reflect asymmetric edema or infection. Mild cardiomegaly. No evidence of pulmonary embolus. Electronically Signed   By: KRolm BaptiseM.D.   On: 04/25/2018 21:30   Dg Chest Port 1 View  Result Date: 05/01/2018 CLINICAL DATA:  Respiratory failure EXAM: PORTABLE CHEST 1 VIEW COMPARISON:  04/30/2018 FINDINGS: Cardiac shadow is stable. Bilateral airspace opacities are again identified stable from the previous exam. No new focal abnormality is seen. No bony abnormality is noted. IMPRESSION: Stable airspace opacities bilaterally. Electronically Signed   By: MInez CatalinaM.D.   On: 05/01/2018 06:35   Dg Chest Port 1 View  Result Date: 04/30/2018 CLINICAL DATA:  Hypoxia, respiratory failure. EXAM: PORTABLE CHEST 1 VIEW COMPARISON:  Radiograph of April 28, 2018. FINDINGS: Stable cardiomegaly. Stable bilateral diffuse airspace opacities are noted. No  pneumothorax is noted. Bony thorax is unremarkable. IMPRESSION: Stable bilateral airspace opacities are noted. Electronically Signed   By: JMarijo Conception M.D.   On: 04/30/2018 08:52   Dg Chest Port 1 View Result Date: 04/25/2018 CLINICAL DATA:  Pulmonary edema, shortness of breath EXAM: PORTABLE CHEST 1 VIEW COMPARISON:  04/25/2018 FINDINGS: Cardiomegaly. Diffuse bilateral airspace disease again noted, not significantly changed. No effusions or acute bony abnormality. IMPRESSION: Severe diffuse bilateral airspace disease with cardiomegaly. Opacities could reflect edema or infection. No change. Electronically Signed   By: KRolm BaptiseM.D.   On: 04/25/2018 19:13   Dg Chest Port 1 View Result Date: 04/25/2018 CLINICAL DATA:  Shortness of breath. EXAM: PORTABLE CHEST 1 VIEW COMPARISON:  09/30/2017. FINDINGS: 1349 hours. The cardio pericardial silhouette is enlarged. Vascular congestion with pulmonary edema pattern bilaterally. The visualized bony structures of the thorax are intact. Telemetry leads overlie the chest. IMPRESSION: Cardiomegaly with pulmonary edema. Electronically Signed   By: EMisty StanleyM.D.   On: 04/25/2018 14:20   Results/Tests Pending at Time of Discharge:  Hypersensitivity pneumonitis  Discharge Medications:  Allergies as of 05/03/2018   No Known Allergies     Medication List    STOP taking these medications   colchicine 0.6 MG tablet   hydrochlorothiazide 25 MG tablet Commonly known as:  HYDRODIURIL     TAKE these medications   ACCU-CHEK AVIVA PLUS test strip Generic drug:  glucose blood as directed   AEROCHAMBER MV inhaler Use with albuterol inhaler   allopurinol 100 MG tablet Commonly known as:  ZYLOPRIM Take 1 tablet (100 mg total) by mouth daily. Take EVEN AFTER pain has resolved, this suppresses gout. What changed:  additional instructions   amLODipine 10 MG tablet Commonly known as:  NORVASC Take 1 tablet (10 mg total) by mouth daily.   calcium  carbonate 500 MG chewable tablet Commonly known as:  TUMS - dosed in mg elemental calcium Chew 2 tablets (400 mg of elemental calcium total) by mouth 3 (three) times daily between meals as needed for indigestion or heartburn.   cetirizine 10 MG tablet Commonly known as:  ZYRTEC take 1 tablet by mouth once daily   desmopressin 0.2 MG tablet Commonly known as:  DDAVP Take 0.4 mg by mouth at bedtime.   fluticasone 110 MCG/ACT inhaler Commonly known as:  FLOVENT HFA Inhale 2 puffs into the lungs 2 (two) times daily.   fluticasone 50 MCG/ACT nasal spray Commonly known as:  FLONASE SHAKE LIQUID AND USE 2 SPRAYS IN EACH NOSTRIL EVERY DAY What changed:  See the new instructions.   Lancets 30G Misc 1 Device by Does not apply route daily before breakfast.   ACCU-CHEK SOFTCLIX LANCETS lancets Use as instructed   metFORMIN 500 MG 24 hr tablet Commonly known as:  GLUCOPHAGE-XR Take 2 tablets (1,000 mg total) by mouth daily with breakfast. TAKE 2 TABLETS BY MOUTH EVERY DAY WITH BREAKFAST What changed:    how much to take  how to take this  when to take this  additional instructions   Misc. Devices Misc Please provide a pulse oximeter for the ear lobe. Patient has diagnosis of Raynaud, asthma   montelukast 10 MG tablet Commonly known as:  SINGULAIR Take 1 tablet (10 mg total) by mouth at bedtime.   Nebulizer/Adult Mask Kit 1 Device by Does not apply route once.   Respiratory Therapy Supplies Misc BiPAP IPAP:20, EPAP: 8, back up respiratory rate: 10, FIO2: 35%.   olopatadine 0.1 % ophthalmic solution Commonly known as:  PATANOL Place 1 drop into both eyes 2 (two) times daily as needed for allergies.   oxybutynin 5 MG tablet Commonly known as:  DITROPAN TAKE 2 TABLETS BY MOUTH AT BEDTIME   pantoprazole 20 MG tablet Commonly known as:  PROTONIX Take 1 tablet (20 mg total) by mouth daily.   polyethylene glycol packet Commonly known as:  MIRALAX / GLYCOLAX Take 17 g by  mouth daily as needed for mild constipation.   predniSONE 10 MG tablet Commonly known as:  DELTASONE Take 73m (5 tabs) for 1 day; 469m(4 tabs) for 2 days; 2055m2 tabs) for 2 days; 10 mg (1 tab) for 2 days, and 5 mg (0.5 tab) for 2 days   PREVIDENT 5000 ENAMEL PROTECT 1.1-5 % Pste Generic drug:  Sod Fluoride-Potassium Nitrate Take 1 application by mouth 2 (two) times daily.   PROAIR HFA 108 (90 Base) MCG/ACT inhaler Generic drug:  albuterol inhale 2 puff by mouth every 6 hours if needed What changed:  Another medication with the same name was changed. Make sure you understand how and when to take each.   albuterol (2.5 MG/3ML) 0.083% nebulizer solution Commonly known as:  PROVENTIL inhale contents of 1 vial in nebulizer once daily What changed:    how much to take  how  to take this  when to take this  additional instructions   torsemide 20 MG tablet Commonly known as:  DEMADEX Take 2 tablets (40 mg total) by mouth 2 (two) times daily. What changed:    how much to take  when to take this            Durable Medical Equipment  (From admission, onward)         Start     Ordered   05/01/18 1538  For home use only DME Bipap  Once    Comments:  Pt already has Trilogy with [facility] at home- need to be sure settings are set to what pt is using here in the hospital AVAPS mode TV 400 adjust EPAP and IPAP min and max per patient (IPAP 18 min-40 max) set rate 16, 02 as previously ordered , PIP 27  Question Answer Comment  Bleed in oxygen (LPM) 0   Keep 02 saturation >90 <95   Inspiratory pressure 18   Expiratory pressure 5      05/01/18 1538          Discharge Instructions: Please refer to Patient Instructions section of EMR for full details.  Patient was counseled important signs and symptoms that should prompt return to medical care, changes in medications, dietary instructions, activity restrictions, and follow up appointments.   Follow-Up  Appointments:   Shirley, [place], DO 05/03/2018, 11:43 AM PGY-2, [facility]

## 2018-05-01 NOTE — Progress Notes (Signed)
FPTS Social Progress Note  Family Medicine teaching service following socially for continuity of care.  Received message that patient will come back to our service tomorrow.  Appreciate the care by CCM.   Sandre Kitty, MD 05/01/2018, 11:51 AM PGY-1, [facility] Family Medicine Service pager [phone]

## 2018-05-01 NOTE — Care Management Note (Signed)
Case Management Note Previous CM note completed by Joshua Haven, RN 04/27/2018, 9:33 AM  Patient Details  Name: [name] MRN: [id number] Date of Birth: [birth year]  Subjective/Objective:   From home with mom, pulmonary edema. Hx of prader-willi syndrome-(is on triology vent from home), morbid obesity, dm, chf, acute/chronic hypoxic resp failure, poss pna, HFNC 10     9/25 Joshua Cape RN, BSN - he is not making progress , may need intubation and possibly trach per MD note, transferred to ICU.              Action/Plan: NCM will follow for transition of care needs.   Expected Discharge Date:                  Expected Discharge Plan:  Home w Home Health Services  In-House Referral:     Discharge planning Services  CM Consult  Post Acute Care Choice:    Choice offered to:     DME Arranged:    DME Agency:   [facility]  HH Arranged:    HH Agency:     Status of Service:  In process, will continue to follow  If discussed at Long Length of Stay Meetings, dates discussed:    Discharge Disposition:   Additional Comments:  05/01/18- 1130- Joshua Pierini RN, CM- call received from bedside RN regarding pt's home Trilogy vent- want to be sure pt goes home on correct settings that he has been using here- mom has Trilogy at the bedside- CM has placed call to [name] with [facility] regarding need to have Trilogy settings updated - CM spoke with Joshua Canales Minor NP regarding what settings where needed at this time along with RT on the unit. [facility] will either send someone to hospital to change settings and review home vent with mom or to the home on discharge- [name] with [facility] to f/u in the am.   Joshua Span, RN 05/01/2018, 12:56 PM Care Coordinator [phone]

## 2018-05-02 DIAGNOSIS — J9690 Respiratory failure, unspecified, unspecified whether with hypoxia or hypercapnia: Secondary | ICD-10-CM

## 2018-05-02 LAB — BASIC METABOLIC PANEL
ANION GAP: 8 (ref 5–15)
BUN: 62 mg/dL — ABNORMAL HIGH (ref 6–20)
CALCIUM: 9 mg/dL (ref 8.9–10.3)
CO2: 38 mmol/L — AB (ref 22–32)
CREATININE: 1.13 mg/dL (ref 0.61–1.24)
Chloride: 93 mmol/L — ABNORMAL LOW (ref 98–111)
GLUCOSE: 209 mg/dL — AB (ref 70–99)
Potassium: 4.3 mmol/L (ref 3.5–5.1)
Sodium: 139 mmol/L (ref 135–145)

## 2018-05-02 LAB — GLUCOSE, CAPILLARY
GLUCOSE-CAPILLARY: 294 mg/dL — AB (ref 70–99)
Glucose-Capillary: 195 mg/dL — ABNORMAL HIGH (ref 70–99)
Glucose-Capillary: 313 mg/dL — ABNORMAL HIGH (ref 70–99)

## 2018-05-02 MED ORDER — PREDNISONE 5 MG PO TABS: 5.0000 mg | ORAL_TABLET | Freq: Every day | ORAL | Status: DC

## 2018-05-02 MED ORDER — METHYLPREDNISOLONE SODIUM SUCC 125 MG IJ SOLR: 60.0000 mg | INTRAMUSCULAR | Status: DC

## 2018-05-02 MED ORDER — PREDNISONE 20 MG PO TABS: 20.0000 mg | ORAL_TABLET | Freq: Every day | ORAL | Status: DC

## 2018-05-02 MED ORDER — PREDNISONE 20 MG PO TABS
50.0000 mg | ORAL_TABLET | Freq: Every day | ORAL | Status: DC
  Administered 2018-05-03: 50 mg via ORAL
  Filled 2018-05-02: qty 2

## 2018-05-02 MED ORDER — TORSEMIDE 20 MG PO TABS
40.0000 mg | ORAL_TABLET | Freq: Two times a day (BID) | ORAL | Status: DC
  Administered 2018-05-02 – 2018-05-03 (×2): 40 mg via ORAL
  Filled 2018-05-02 (×2): qty 2

## 2018-05-02 MED ORDER — PREDNISONE 20 MG PO TABS: 40.0000 mg | ORAL_TABLET | Freq: Every day | ORAL | Status: DC

## 2018-05-02 MED ORDER — PREDNISONE 10 MG PO TABS: 10.0000 mg | ORAL_TABLET | Freq: Every day | ORAL | Status: DC

## 2018-05-02 NOTE — Progress Notes (Signed)
I will cosign resident's note once completed.  Patient is doing well today. His mother feels that he is almost back at his baseline. He need to self ambulate. O2 requirement is back at baseline.  Transition to oral diuretics for pulm edema. Monitor I&Os.  PT to evaluate him today.  Likely d/c home tomorrow.

## 2018-05-02 NOTE — Progress Notes (Signed)
Family Medicine Teaching Service Daily Progress Note Intern Pager: [phone]  Patient name: [name] Medical record number: [id number] Date of birth: [birth year] Age: 22 y.o. Gender: male  Primary Care Provider: Dollene Cleveland, DO Consultants: pulmonology Code Status: Full code  Pt Overview and Major Events to Date:  Hospital Day 7 Admitted: 04/25/2018  Assessment and Plan: Keldric Poyer Roachis a 22 y.o.malepresenting with multifactorial acute respiratory failure.  PMH is significant forPrader-Willi Syndrome, Morbid Obesity, OHS, HTN, Chronic Respiratory Failure, Raynaud's Syndrome, Asthma, T2DM, Chronic Respiratory Failure, and Gout.  #Acute respiratory failure, multifactorial from OSA/OHS, chronic diastolic CHF, CAP Patient doing much better today.  Is on 1L nasal cannula, which is lower than his home O2 requirements. Patient using home tilogy at night with no desaturations.  Faint wheezing could be heard on physical exam.  Net negative 12L for this admission.   Switch solumedrol 60mg  BID to prednisone with q2d taper (50; 40; 20; 10;5)    Trilogy at night using home machine  Switch lasix to torsemide 40 BID  I&O,Daily Weights,   PT for evalaution of deconditioning.    #CAP, acute  Finished CTX on 9/27   #PraderWilliSyndrome  #Asthma and Allergies   Restartedhome singular, flonase. loratidine and pulmicort as formulary alternatives for cetirizine and flovent while admitted  duonebsPRN  #HTN, 148/100.    restart home amlodipine 10mg   monitor BPs  #T2DM,chronic HbA1c Aug 2019 7.2% down from 7.4%.At home onMetformin-XR 500mg  daily..  Last 2 cbg 214, 195.   Increase home metformin to 1000mg   antus 5U qhs  mSSI - needed 19 U yesterday.   monitor CBGs  #Iron Deficiencyanemia, chronic  stable Hgb at baseline ~10.5. Last iron 15, ferritin 27 and sat 4 on 08/28/17 suggesting iron deficiency anemia.  consider iron supplementation as  outpt  #Gout, chronic takes colchicine 0.6mg  and allopurinol 100mg  dailyat home  restart home allopurinol  #Nocturnal enuresis, chronic Desmopressin 0.4mg  qHS and Oxybutynin 10mg  qHS,   restart home desmopressin  Holdingoxybutyninwith diuresis.  #GERD, chronic Protonix 20mg  daily  restart home protonix  Fluids: nond   Electrolytes: replete prn  Nutrition: heart healthy carb modified GI ppx: protonix DVT px: lovenox 80mg  qdaily  Disposition: home   Medications: Scheduled Meds: . amLODipine  10 mg Oral Daily  . budesonide  0.25 mg Nebulization BID  . desmopressin  0.4 mg Oral QHS  . enoxaparin (LOVENOX) injection  80 mg Subcutaneous Q24H  . fluticasone  2 spray Each Nare Daily  . furosemide  40 mg Intravenous Q12H  . Influenza vac split quadrivalent PF  0.5 mL Intramuscular Tomorrow-1000  . insulin aspart  0-15 Units Subcutaneous TID WC  . insulin glargine  5 Units Subcutaneous Daily  . loratadine  10 mg Oral Daily  . methylPREDNISolone (SOLU-MEDROL) injection  60 mg Intravenous Q12H  . montelukast  10 mg Oral QHS  . pantoprazole  20 mg Oral Daily   Continuous Infusions: PRN Meds: acetaminophen **OR** acetaminophen, ipratropium-albuterol, polyethylene glycol  ================================================= ================================================= Subjective:  Mom states patient is doing much better, as does patient.  No complaints of headache.  No pain.  Mom says this 1L o2 is better than at home, which is 2L o2.  States he did not have any desaturations during the night on home trilogy.    Objective: Temp:  [98.1 F (36.7 C)-98.4 F (36.9 C)] 98.1 F (36.7 C) (09/28 0318) Pulse Rate:  [37-111] 100 (09/28 0318) Resp:  [20-37] 20 (09/27 2126) BP: (123-155)/(85-109) 148/100 (09/28  8469) SpO2:  [76 %-100 %] 100 % (09/28 0318) Intake/Output 09/27 0701 - 09/28 0700 In: 600 [P.O.:600] Out: 2200 [Urine:2200] Physical Exam:  Gen: NAD, alert,  non-toxic, well-nourished, well-appearing, sitting comfortably in his chair. On nasal cannula.  1L.  HEENT: Normocephaic, atraumatic. Clear conjuctiva, no scleral icterus and injection.  CV: Regular rate and rhythm.  Normal S1-S2.    Radial pulses 2+ bilaterally. No pitting edema.  Resp: Clear to auscultation bilaterally.  Slight wheezing.  Can hear air movement much better than previous exams  No increased work of breathing appreciated. Abd: Nontender and nondistended on palpation to all 4 quadrants.  Positive bowel sounds. Psych: Cooperative with exam. Pleasant. Makes eye contact.   Laboratory: Recent Labs  Lab 04/26/18 0619 04/27/18 0729 04/29/18 0539  WBC 8.6 8.5 8.8  HGB 10.8* 10.2* 10.1*  HCT 37.5* 35.4* 35.8*  PLT 235 252 254   Recent Labs  Lab 04/25/18 1302  04/28/18 0324 04/29/18 0539 05/01/18 0414 05/02/18 0129  NA 140   < > 144 144 142 139  K 3.8   < > 4.6 4.5 4.6 4.3  CL 94*   < > 97* 97* 94* 93*  CO2 32   < > 30 35* 39* 38*  BUN 28*   < > 27* 28* 56* 62*  CREATININE 1.11   < > 1.00 1.12 1.09 1.13  CALCIUM 8.7*   < > 9.1 9.0 9.4 9.0  PROT 7.7  --  6.8  --   --   --   BILITOT 1.5*  --  1.5*  --   --   --   ALKPHOS 160*  --  126  --   --   --   ALT 30  --  20  --   --   --   AST 39  --  31  --   --   --   GLUCOSE 239*   < > 135* 104* 221* 209*   < > = values in this interval not displayed.    Imaging/Diagnostic Tests: Dg Chest Port 1 View  Result Date: 05/01/2018 CLINICAL DATA:  Respiratory failure EXAM: PORTABLE CHEST 1 VIEW COMPARISON:  04/30/2018 FINDINGS: Cardiac shadow is stable. Bilateral airspace opacities are again identified stable from the previous exam. No new focal abnormality is seen. No bony abnormality is noted. IMPRESSION: Stable airspace opacities bilaterally. Electronically Signed   By: Alcide Clever M.D.   On: 05/01/2018 06:35   Dg Chest Port 1 View  Result Date: 04/30/2018 CLINICAL DATA:  Hypoxia, respiratory failure. EXAM: PORTABLE CHEST  1 VIEW COMPARISON:  Radiograph of April 28, 2018. FINDINGS: Stable cardiomegaly. Stable bilateral diffuse airspace opacities are noted. No pneumothorax is noted. Bony thorax is unremarkable. IMPRESSION: Stable bilateral airspace opacities are noted. Electronically Signed   By: Lupita Raider, M.D.   On: 04/30/2018 08:52      Sandre Kitty, MD 05/02/2018, 7:42 AM PGY-1, [facility] Family Medicine FPTS Intern pager: [phone], text pages welcome

## 2018-05-03 DIAGNOSIS — I5030 Unspecified diastolic (congestive) heart failure: Secondary | ICD-10-CM

## 2018-05-03 LAB — BASIC METABOLIC PANEL
Anion gap: 12 (ref 5–15)
BUN: 55 mg/dL — AB (ref 6–20)
CHLORIDE: 93 mmol/L — AB (ref 98–111)
CO2: 35 mmol/L — ABNORMAL HIGH (ref 22–32)
Calcium: 9.1 mg/dL (ref 8.9–10.3)
Creatinine, Ser: 1 mg/dL (ref 0.61–1.24)
GFR calc Af Amer: >60 mL/min (ref >60)
Glucose, Bld: 193 mg/dL — ABNORMAL HIGH (ref 70–99)
POTASSIUM: 4.2 mmol/L (ref 3.5–5.1)
Sodium: 140 mmol/L (ref 135–145)

## 2018-05-03 LAB — GLUCOSE, CAPILLARY
Glucose-Capillary: 139 mg/dL — ABNORMAL HIGH (ref 70–99)
Glucose-Capillary: 223 mg/dL — ABNORMAL HIGH (ref 70–99)

## 2018-05-03 MED ORDER — TORSEMIDE 20 MG PO TABS: 40.0000 mg | ORAL_TABLET | Freq: Two times a day (BID) | ORAL | 0 refills | Status: DC

## 2018-05-03 MED ORDER — PREDNISONE 10 MG PO TABS: ORAL_TABLET | ORAL | 0 refills | Status: DC

## 2018-05-03 MED ORDER — METFORMIN HCL ER 500 MG PO TB24: 1000.0000 mg | ORAL_TABLET | Freq: Every day | ORAL | 0 refills | Status: DC

## 2018-05-03 MED ORDER — POLYETHYLENE GLYCOL 3350 17 G PO PACK: 17.0000 g | PACK | Freq: Every day | ORAL | 0 refills | Status: DC | PRN

## 2018-05-03 NOTE — Plan of Care (Signed)
Pt ambulated to BR with mother, tolerated activity well.  No SOB noted.

## 2018-05-03 NOTE — Care Management (Signed)
Portable oxygen tank for transport home requested from [facility].

## 2018-05-03 NOTE — Evaluation (Signed)
Physical Therapy Evaluation Patient Details Name: [name] MRN: [id number] DOB: [birth year] Today's Date: 05/03/2018   History of Present Illness  Pt is a 22 y.o. M with significant PMH of Prader-Willi Syndrome, morbid obesity, OHS, HTN, chronic respiratory failure, raynaud's syndrome, asthma, T2DM, chronic respiratory failure and gout who is now presenting with multifactorial acute respiratory failure.  Clinical Impression  Pt admitted with above diagnosis. Pt currently with functional limitations due to the deficits listed below (see PT Problem List). At baseline, patient mom assists with IADL's and patient is a limited community ambulator with no device. Upon PT evaluation, patient presenting with decreased functional mobility secondary to deconditioning and decreased endurance. Ambulating 100 feet with min guard assist; displays mild unsteadiness. SpO2 97% on 2L O2 prior to mobility, decreased to 87% SpO2 during mobility and increased to 3L O2, returned to 91% SpO2 upon return to seated. Pt mother cueing patient for pacing, pursed lip breathing, and rest breaks. Pt will benefit from skilled PT to increase their independence and safety with mobility to allow discharge to the venue listed below.       Follow Up Recommendations Home health PT;Supervision for mobility/OOB    Equipment Recommendations  None recommended by PT    Recommendations for Other Services OT consult     Precautions / Restrictions Precautions Precautions: Fall Precaution Comments: watch O2 Restrictions Weight Bearing Restrictions: No      Mobility  Bed Mobility               General bed mobility comments: OOB in chair  Transfers Overall transfer level: Needs assistance Equipment used: None Transfers: Sit to/from Stand Sit to Stand: Supervision            Ambulation/Gait Ambulation/Gait assistance: Min guard Gait Distance (Feet): 100 Feet Assistive device: None Gait Pattern/deviations:  Wide base of support;Decreased stride length;Step-through pattern Gait velocity: decr Gait velocity interpretation: <1.8 ft/sec, indicate of risk for recurrent falls General Gait Details: Patient ambulating with mild unsteadiness and incorporating a wide BOS secondary to body mass; no overt LOB. Pt mom cued for multiple standing rest breaks  Stairs            Wheelchair Mobility    Modified Rankin (Stroke Patients Only)       Balance Overall balance assessment: Mild deficits observed, not formally tested                                           Pertinent Vitals/Pain Pain Assessment: No/denies pain    Home Living Family/patient expects to be discharged to:: Private residence Living Arrangements: Parent;Other relatives Available Help at Discharge: Family;Available 24 hours/day Type of Home: House Home Access: Level entry     Home Layout: Two level Home Equipment: Shower seat      Prior Function Level of Independence: Needs assistance   Gait / Transfers Assistance Needed: Ambulates independently. Takes frequent rest breaks for community ambulation  ADL's / Homemaking Assistance Needed: Mom assists with IADL's        Hand Dominance        Extremity/Trunk Assessment   Upper Extremity Assessment Upper Extremity Assessment: Generalized weakness    Lower Extremity Assessment Lower Extremity Assessment: Generalized weakness    Cervical / Trunk Assessment Cervical / Trunk Assessment: Other exceptions Cervical / Trunk Exceptions: increased body habitus  Communication   Communication: No difficulties  Cognition  Arousal/Alertness: Awake/alert Behavior During Therapy: WFL for tasks assessed/performed Overall Cognitive Status: History of cognitive impairments - at baseline                                 General Comments: At cognitive baseline. Very pleasant and willing to participate in therapy. Follows all commands and  answers in mostly one word responses; looks to mom to answer most home set up and PLOF questions      General Comments      Exercises     Assessment/Plan    PT Assessment Patient needs continued PT services  PT Problem List Decreased strength;Decreased activity tolerance;Decreased balance;Decreased mobility;Obesity       PT Treatment Interventions Gait training;Stair training;Functional mobility training;Therapeutic activities;Therapeutic exercise;Balance training;Patient/family education    PT Goals (Current goals can be found in the Care Plan section)  Acute Rehab PT Goals Patient Stated Goal: none stated by pt; pt mother agreeable to HHPT PT Goal Formulation: With patient/family Time For Goal Achievement: 05/17/18 Potential to Achieve Goals: Good    Frequency Min 3X/week   Barriers to discharge        Co-evaluation               AM-PAC PT "6 Clicks" Daily Activity  Outcome Measure Difficulty turning over in bed (including adjusting bedclothes, sheets and blankets)?: A Little Difficulty moving from lying on back to sitting on the side of the bed? : A Little Difficulty sitting down on and standing up from a chair with arms (e.g., wheelchair, bedside commode, etc,.)?: A Little Help needed moving to and from a bed to chair (including a wheelchair)?: A Little Help needed walking in hospital room?: A Little Help needed climbing 3-5 steps with a railing? : A Lot 6 Click Score: 17    End of Session Equipment Utilized During Treatment: Oxygen Activity Tolerance: Patient tolerated treatment well Patient left: in chair;with call bell/phone within reach Nurse Communication: Mobility status PT Visit Diagnosis: Unsteadiness on feet (R26.81);Difficulty in walking, not elsewhere classified (R26.2)    Time: 1205-1223 PT Time Calculation (min) (ACUTE ONLY): 18 min   Charges:   PT Evaluation $PT Eval Moderate Complexity: 1 Mod     Laurina Bustle, [place], DPT Acute  Rehabilitation Services Pager [phone] Office [phone]   Vanetta Mulders 05/03/2018, 12:55 PM

## 2018-05-04 LAB — GLUCOSE, CAPILLARY
GLUCOSE-CAPILLARY: 125 mg/dL — AB (ref 70–99)
GLUCOSE-CAPILLARY: 325 mg/dL — AB (ref 70–99)

## 2018-05-04 LAB — HYPERSENSITIVITY PNEUMONITIS
A. FUMIGATUS #1 ABS: NEGATIVE
A. PULLULANS ABS: NEGATIVE
Micropolyspora faeni, IgG: NEGATIVE
Pigeon Serum Abs: NEGATIVE
THERMOACT. SACCHARII: NEGATIVE
THERMOACTINOMYCES VULGARIS IGG: NEGATIVE

## 2018-05-05 ENCOUNTER — Ambulatory Visit: Payer: Medicaid Other

## 2018-05-11 ENCOUNTER — Telehealth: Payer: Self-pay

## 2018-05-11 ENCOUNTER — Other Ambulatory Visit: Payer: Self-pay | Admitting: *Deleted

## 2018-05-11 NOTE — Telephone Encounter (Signed)
Pt has an appt with Dr. Lum Babe tomorrow but would like to get the Rx today. Sunday Spillers, CMA

## 2018-05-12 ENCOUNTER — Ambulatory Visit (INDEPENDENT_AMBULATORY_CARE_PROVIDER_SITE_OTHER): Payer: Medicaid Other | Admitting: Family Medicine

## 2018-05-12 ENCOUNTER — Other Ambulatory Visit: Payer: Self-pay

## 2018-05-12 ENCOUNTER — Encounter: Payer: Self-pay | Admitting: Family Medicine

## 2018-05-12 VITALS — BP 128/80 | HR 108 | Temp 98.2°F | Ht 59.0 in | Wt 344.0 lb

## 2018-05-12 DIAGNOSIS — J9610 Chronic respiratory failure, unspecified whether with hypoxia or hypercapnia: Secondary | ICD-10-CM | POA: Diagnosis not present

## 2018-05-12 DIAGNOSIS — I10 Essential (primary) hypertension: Secondary | ICD-10-CM

## 2018-05-12 DIAGNOSIS — Z23 Encounter for immunization: Secondary | ICD-10-CM | POA: Diagnosis not present

## 2018-05-12 DIAGNOSIS — E118 Type 2 diabetes mellitus with unspecified complications: Secondary | ICD-10-CM | POA: Diagnosis not present

## 2018-05-12 MED ORDER — GLUCOSE BLOOD VI STRP: ORAL_STRIP | 0 refills | Status: DC

## 2018-05-12 MED ORDER — ACCU-CHEK AVIVA PLUS W/DEVICE KIT: PACK | 0 refills | Status: DC

## 2018-05-12 MED ORDER — ACCU-CHEK SOFTCLIX LANCETS MISC: 0 refills | Status: DC

## 2018-05-12 NOTE — Progress Notes (Signed)
Subjective:     Patient ID: [name], male   DOB: 10-Oct-1995, 22 y.o.   MRN: [id number]  HPI OHS/CHF: Denies any issue since he left the hospital. Mom place him on 2 L O2 instead of 1L. She stated that he does well on 1 L or no oxygen. However, she is afraid of cutting it down. He is yet to f/u with the pulmonologist. Now on Torsemide 40 mg BID. HTN: Compliant with his meds. Here for f/u. DM2: He is compliant with Metformin XL 1000 mg daily. Mom requested new glucometer.  Current Outpatient Medications on File Prior to Visit  Medication Sig Dispense Refill  . allopurinol (ZYLOPRIM) 100 MG tablet Take 1 tablet (100 mg total) by mouth daily. Take EVEN AFTER pain has resolved, this suppresses gout. (Patient taking differently: Take 100 mg by mouth daily. ) 30 tablet 6  . amLODipine (NORVASC) 10 MG tablet Take 1 tablet (10 mg total) by mouth daily. 90 tablet 3  . calcium carbonate (TUMS - DOSED IN MG ELEMENTAL CALCIUM) 500 MG chewable tablet Chew 2 tablets (400 mg of elemental calcium total) by mouth 3 (three) times daily between meals as needed for indigestion or heartburn. 60 tablet 3  . cetirizine (ZYRTEC) 10 MG tablet take 1 tablet by mouth once daily (Patient taking differently: Take 10 mg by mouth daily. ) 90 tablet 1  . desmopressin (DDAVP) 0.2 MG tablet Take 0.4 mg by mouth at bedtime.     . fluticasone (FLONASE) 50 MCG/ACT nasal spray SHAKE LIQUID AND USE 2 SPRAYS IN EACH NOSTRIL EVERY DAY (Patient taking differently: Place 2 sprays into both nostrils daily. ) 16 g 0  . fluticasone (FLOVENT HFA) 110 MCG/ACT inhaler Inhale 2 puffs into the lungs 2 (two) times daily. 1 Inhaler 12  . metFORMIN (GLUCOPHAGE-XR) 500 MG 24 hr tablet Take 2 tablets (1,000 mg total) by mouth daily with breakfast. TAKE 2 TABLETS BY MOUTH EVERY DAY WITH BREAKFAST 60 tablet 0  . montelukast (SINGULAIR) 10 MG tablet Take 1 tablet (10 mg total) by mouth at bedtime. 90 tablet 3  . olopatadine (PATANOL) 0.1 %  ophthalmic solution Place 1 drop into both eyes 2 (two) times daily as needed for allergies.  0  . oxybutynin (DITROPAN) 5 MG tablet TAKE 2 TABLETS BY MOUTH AT BEDTIME (Patient taking differently: Take 10 mg by mouth at bedtime. ) 60 tablet 0  . pantoprazole (PROTONIX) 20 MG tablet Take 1 tablet (20 mg total) by mouth daily. 90 tablet 0  . torsemide (DEMADEX) 20 MG tablet Take 2 tablets (40 mg total) by mouth 2 (two) times daily. 60 tablet 0  . ACCU-CHEK AVIVA PLUS test strip as directed 100 each 12  . ACCU-CHEK SOFTCLIX LANCETS lancets Use as instructed 100 each 12  . albuterol (PROVENTIL) (2.5 MG/3ML) 0.083% nebulizer solution inhale contents of 1 vial in nebulizer once daily (Patient not taking: Reported on 05/12/2018) 75 mL 12  . Lancets 30G MISC 1 Device by Does not apply route daily before breakfast. 100 each 3  . Misc. Devices MISC Please provide a pulse oximeter for the ear lobe. Patient has diagnosis of Raynaud, asthma 1 each 0  . polyethylene glycol (MIRALAX / GLYCOLAX) packet Take 17 g by mouth daily as needed for mild constipation. (Patient not taking: Reported on 05/12/2018) 14 each 0  . PREVIDENT 5000 ENAMEL PROTECT 1.1-5 % PSTE Take 1 application by mouth 2 (two) times daily.   0  . PROAIR HFA 108 (  90 Base) MCG/ACT inhaler inhale 2 puff by mouth every 6 hours if needed (Patient not taking: Reported on 04/25/2018) 8.5 Inhaler 0  . Respiratory Therapy Supplies (NEBULIZER/ADULT MASK) KIT 1 Device by Does not apply route once. 1 each 0  . Respiratory Therapy Supplies MISC BiPAP IPAP:20, EPAP: 8, back up respiratory rate: 10, FIO2: 35%. 1 each 0  . Spacer/Aero-Holding Chambers (AEROCHAMBER MV) inhaler Use with albuterol inhaler 1 each 1   No current facility-administered medications on file prior to visit.    Vitals:   05/12/18 0849  BP: 128/80  Pulse: (!) 108  Temp: 98.2 F (36.8 C)  TempSrc: Oral  SpO2: 99%  Weight: (!) 344 lb (156 kg)  Height: '4\' 11"'  (1.499 m)   Past Medical  History:  Diagnosis Date  . Asthma   . CHF (congestive heart failure) ([place])   . Diabetes mellitus without complication ([place])   . Hypertension    Pulmomary htn  . Kidney dysfunction    left  . Obesity   . Obesity hypoventilation syndrome ([place])   . Prader-Willi syndrome   . Raynaud's phenomenon   . Sleep apnea      Review of Systems  Constitutional: Negative.   Respiratory: Negative.   Cardiovascular: Negative.   Gastrointestinal: Negative.   Genitourinary: Negative.   All other systems reviewed and are negative.      Objective:   Physical Exam  Constitutional: He is oriented to person, place, and time.  obese  Cardiovascular: Normal rate, regular rhythm and normal heart sounds.  No murmur heard. Pulmonary/Chest: Effort normal and breath sounds normal. No stridor. No respiratory distress. He has no wheezes.  Abdominal: Soft. Bowel sounds are normal. He exhibits no distension. There is no tenderness.  Musculoskeletal: He exhibits no edema.  Neurological: He is alert and oriented to person, place, and time.  Nursing note and vitals reviewed.      Assessment:     Resp Failure(OHS/CHF): HTN:  DM2:     Plan:     Check problem list.

## 2018-05-12 NOTE — Assessment & Plan Note (Signed)
Multifactorial: OHS,CHF S/P hospital admission. Currently stable on 1-2 L O2 daily. Continue Torsemide for CHF as recommended. Mom advised to check his O2 Sat when on 1 L O2 and keep him between 92-95. I advised paying attention to respiratory distress symptoms and indications for ED visit discussed. F/U with PCP in 3-4 weeks for reassessment or sooner if having symptoms. Pulm appointment recommended. Mom agreed to schedule appointment.

## 2018-05-12 NOTE — Patient Instructions (Signed)
Hypoxemia  Hypoxemia occurs when the blood does not contain enough oxygen. The body cannot work well when it does not have enough oxygen because every part of the body needs oxygen. Oxygen enters the lungs when we breathe in, then it travels to all parts of the body through the blood. Hypoxemia can develop suddenly or slowly.  What are the causes?  Common causes of this condition include:   Long-term (chronic) lung diseases, such as chronic obstructive pulmonary disease (COPD) or interstitial lung disease.   Disorders that affect breathing at night, such as sleep apnea.   Fluid buildup in the lungs (pulmonary edema).   Lung infection (pneumonia).   Lung or throat cancer.   Abnormal blood flow that bypasses the lungs (having a shunt).   Certain diseasesthat affect nerves or muscles.   A collapsed lung (pneumothorax).   A blood clot in the lungs (pulmonary embolus).   Certain types of heart disease.   Slow or shallow breathing (hypoventilation).   Certain medicines.   High altitudes.   Toxic chemicals, smoke, and gases.    What are the signs or symptoms?  In some cases, there may be no symptoms of this condition. If you do have symptoms, they may include:   Shortness of breath (dyspnea).   Bluish color of the skin, lips, or nail beds.   Breathing that is fast, noisy, or shallow.   A fast heartbeat.   Feeling tired or sleepy.   Feeling confused or worried.    If hypoxemia develops quickly, you will likely have dyspnea. If hypoxemia develops slowly over months or years, you may not notice any symptoms.  How is this diagnosed?  This condition is diagnosed by:   A physical exam.   Blood tests.   A test that measures the percentage of oxygen in your blood (pulse oximetry). This is done with a sensor that is placed on your finger, toe, or earlobe.    How is this treated?  Treatment for this condition depends on the underlying cause of your hypoxemia. You will likely be treated with oxygen therapy to  restore your blood oxygen level. Depending on the cause of your hypoxemia, you may need oxygen therapy for a short time (weeks or months), or you may need it for the rest of your life.  Your health care provider may also recommend other therapies to treat the underlying cause of your hypoxemia.  Follow these instructions at home:   Take over-the-counter and prescription medicines only as told by your health care provider.   If you are on oxygen therapy, follow oxygen safety precautions as directed by your health care provider. These may include:  ? Always having a backup supply of oxygen.  ? Not allowing anyone to smoke or have a fire around oxygen.  ? Handling oxygen tanks carefully and as instructed.   Do not use any products that contain nicotine or tobacco, such as cigarettes and e-cigarettes. If you need help quitting, ask your health care provider. Stay away from people who smoke.   Keep all follow-up visits as told by your health care provider. This is important.  Contact a health care provider if:   You have any concerns about your oxygen therapy.   You have trouble breathing, even during or after treatment.   You become short of breath when you exercise.   You are tired when you wake up.   You have a headache when you wake up.  Get help right away   if:   Your shortness of breath gets worse, especially with normal or minimal activity.   You have a bluish color of the skin, lips, or nail beds.   You become confused or you cannot think properly.   You cough up dark mucus or blood.   You have chest pain.   You have a fever.  Summary   Hypoxemia occurs when the blood does not contain enough oxygen.   Hypoxemia may or may not cause symptoms. Often, the main symptom is shortness of breath (dyspnea).   Depending on the cause of your hypoxemia, you may need oxygen therapy for a short time (weeks or months), or you may need it for the rest of your life.   If you are on oxygen therapy, follow oxygen  safety precautions as directed by your health care provider.  This information is not intended to replace advice given to you by your health care provider. Make sure you discuss any questions you have with your health care provider.  Document Released: 02/04/2011 Document Revised: 06/25/2016 Document Reviewed: 06/25/2016  [employer]vier Interactive Patient Education  2017 [employer]

## 2018-05-12 NOTE — Assessment & Plan Note (Signed)
Stable on current Metformin dose. I escribed his Glucometer.

## 2018-05-12 NOTE — Assessment & Plan Note (Signed)
BP looks good off HCTZ. Monitor on current regimen.

## 2018-05-13 MED ORDER — MONTELUKAST SODIUM 10 MG PO TABS: 10.0000 mg | ORAL_TABLET | Freq: Every day | ORAL | 3 refills | Status: DC

## 2018-05-18 ENCOUNTER — Encounter: Payer: Self-pay | Admitting: Primary Care

## 2018-05-18 ENCOUNTER — Ambulatory Visit (INDEPENDENT_AMBULATORY_CARE_PROVIDER_SITE_OTHER)
Admission: RE | Admit: 2018-05-18 | Discharge: 2018-05-18 | Disposition: A | Discharge status: Home / Self Care | Payer: Medicaid Other | Source: Ambulatory Visit | Attending: Primary Care | Admitting: Primary Care

## 2018-05-18 ENCOUNTER — Ambulatory Visit (INDEPENDENT_AMBULATORY_CARE_PROVIDER_SITE_OTHER): Payer: Medicaid Other | Admitting: Primary Care

## 2018-05-18 VITALS — BP 130/84 | HR 105 | Temp 98.6°F | Ht 59.0 in | Wt 344.0 lb

## 2018-05-18 DIAGNOSIS — J189 Pneumonia, unspecified organism: Secondary | ICD-10-CM

## 2018-05-18 NOTE — Patient Instructions (Addendum)
Asthma maintenance: - Continue with Flovent inhaler 2 puffs twice daily - Albuterol nebulizer 1 vial once daily - O2 1-2 L to maintain oxygen levels >90%   Back-up = Proair inhaler 2 puffs every 6 hours as needed for shortness of breath wheezing  Recommendations: I want you to stay active, get outside at least once daily  Increase physical activity, monitor diet and weight  Orders: CXR today   Follow-up: Dr. Delton Coombes in 2 to 3 months

## 2018-05-18 NOTE — Progress Notes (Signed)
_0  ID: [name], male    DOB: 06/03/1996, 22 y.o.   MRN: [id number]  Chief Complaint  Patient presents with  . Follow-up    hospital f/u-2l [place]    Referring provider: Daisy Floro, DO  HPI: 22 year old male, never smoked. PMH asthma, obesity hypoventilation syndrome, pulmonary edema, resp failure, type 2 diabetes, diastolic heart failure, HTN, gout, raynaulds. Patient of Dr. Lamonte Sakai, last seen on 11/13/17.  Hospital course: Patient admitted for respiratory failure from 09/21-09/29. He was treated with IV Lasix and started on Zithromax for community-acquired pneumonia. Respiratory function worsened despite diuretics and antibiotics and was transferred to ICU where he was placed on IV steroids. He did not require intubation. He was switched from Lasix to Torsemide 27m twice daily and oral prednisone.  Hydrochlorothiazide was held on admission for low BP, may need to be restarted at follow-up. Colchicine d/c'd.  Metformin was increased to 1000 mg daily.  Discharged home on 1-2 L nasal cannula and trilogy at night. Prednisone taper until 10/8.   05/20/2018 Patient presents today for hospital follow-up CAP. Accompanied by male family member. Spoke with mother on face time. He is doing well, feels better. Continues Flovent BID, has not require rescue inhaler or nebulizer. Using Trilogy at night. Has been seen for fu by primary care. Plan CXR today. Denies cough, sob, fever.    No Known Allergies  Immunization History  Administered Date(s) Administered  . Hepatitis A 10/16/2006  . Influenza Split 07/02/2011, 04/20/2012  . Influenza,inj,Quad PF,6+ Mos 06/30/2013, 05/12/2017  . Influenza-Unspecified 04/27/2018  . Pneumococcal Polysaccharide-23 05/12/2018  . Td 10/16/2006  . Tdap 01/08/2016    Past Medical History:  Diagnosis Date  . Asthma   . [place]   . Diabetes mellitus without complication ([place]   . Hypertension    Pulmomary htn  .  Kidney dysfunction    left  . Obesity   . [place] syndrome [place]   . Prader-Willi syndrome   . Raynaud's phenomenon   . Sleep apnea     Tobacco History: Social History   Tobacco Use  Smoking Status Never Smoker  Smokeless Tobacco Never Used   Counseling given: Not Answered   Outpatient Medications Prior to Visit  Medication Sig Dispense Refill  . ACCU-CHEK SOFTCLIX LANCETS lancets Use to check CBG daily 100 each 0  . albuterol (PROVENTIL) (2.5 MG/3ML) 0.083% nebulizer solution inhale contents of 1 vial in nebulizer once daily 75 mL 12  . allopurinol (ZYLOPRIM) 100 MG tablet Take 1 tablet (100 mg total) by mouth daily. Take EVEN AFTER pain has resolved, this suppresses gout. (Patient taking differently: Take 100 mg by mouth daily. ) 30 tablet 6  . amLODipine (NORVASC) 10 MG tablet Take 1 tablet (10 mg total) by mouth daily. 90 tablet 3  . Blood Glucose Monitoring Suppl (ACCU-CHEK AVIVA PLUS) w/Device KIT Use to check CBG daily 1 kit 0  . calcium carbonate (TUMS - DOSED IN MG ELEMENTAL CALCIUM) 500 MG chewable tablet Chew 2 tablets (400 mg of elemental calcium total) by mouth 3 (three) times daily between meals as needed for indigestion or heartburn. 60 tablet 3  . cetirizine (ZYRTEC) 10 MG tablet take 1 tablet by mouth once daily (Patient taking differently: Take 10 mg by mouth daily. ) 90 tablet 1  . desmopressin (DDAVP) 0.2 MG tablet Take 0.4 mg by mouth at bedtime.     . fluticasone (FLONASE) 50 MCG/ACT nasal spray SHAKE LIQUID  AND USE 2 SPRAYS IN EACH NOSTRIL EVERY DAY (Patient taking differently: Place 2 sprays into both nostrils daily. ) 16 g 0  . fluticasone (FLOVENT HFA) 110 MCG/ACT inhaler Inhale 2 puffs into the lungs 2 (two) times daily. 1 Inhaler 12  . glucose blood (ACCU-CHEK AVIVA PLUS) test strip Use to check CBG daily 100 each 0  . Lancets 30G MISC 1 Device by Does not apply route daily before breakfast. 100 each 3  . metFORMIN (GLUCOPHAGE-XR) 500 MG 24  hr tablet Take 2 tablets (1,000 mg total) by mouth daily with breakfast. TAKE 2 TABLETS BY MOUTH EVERY DAY WITH BREAKFAST 60 tablet 0  . Misc. Devices MISC Please provide a pulse oximeter for the ear lobe. Patient has diagnosis of Raynaud, asthma 1 each 0  . montelukast (SINGULAIR) 10 MG tablet Take 1 tablet (10 mg total) by mouth at bedtime. 90 tablet 3  . olopatadine (PATANOL) 0.1 % ophthalmic solution Place 1 drop into both eyes 2 (two) times daily as needed for allergies.  0  . oxybutynin (DITROPAN) 5 MG tablet TAKE 2 TABLETS BY MOUTH AT BEDTIME (Patient taking differently: Take 10 mg by mouth at bedtime. ) 60 tablet 0  . pantoprazole (PROTONIX) 20 MG tablet Take 1 tablet (20 mg total) by mouth daily. 90 tablet 0  . polyethylene glycol (MIRALAX / GLYCOLAX) packet Take 17 g by mouth daily as needed for mild constipation. 14 each 0  . PREVIDENT 5000 ENAMEL PROTECT 1.1-5 % PSTE Take 1 application by mouth 2 (two) times daily.   0  . PROAIR HFA 108 (90 Base) MCG/ACT inhaler inhale 2 puff by mouth every 6 hours if needed 8.5 Inhaler 0  . Respiratory Therapy Supplies (NEBULIZER/ADULT MASK) KIT 1 Device by Does not apply route once. 1 each 0  . Respiratory Therapy Supplies MISC BiPAP IPAP:20, EPAP: 8, back up respiratory rate: 10, FIO2: 35%. 1 each 0  . Spacer/Aero-Holding Chambers (AEROCHAMBER MV) inhaler Use with albuterol inhaler 1 each 1  . torsemide (DEMADEX) 20 MG tablet Take 2 tablets (40 mg total) by mouth 2 (two) times daily. 60 tablet 0   No facility-administered medications prior to visit.     Review of Systems  Review of Systems  Constitutional: Negative.   HENT: Negative.   Respiratory: Positive for wheezing. Negative for cough and shortness of breath.   Cardiovascular: Negative.     Physical Exam  BP 130/84 (BP Location: Left Arm, Cuff Size: Large)   Pulse (!) 105   Temp 98.6 F (37 C)   Ht _0  (1.499 m)   Wt (!) 344 lb (156 kg)   SpO2 99%   BMI 69.48 kg/m  Physical  Exam  Constitutional: He appears well-developed and well-nourished. No distress.  Morbid obesity  HENT:  Head: Normocephalic and atraumatic.  Eyes: Pupils are equal, round, and reactive to light.  Neck: Normal range of motion. Neck supple.  Cardiovascular: Normal rate and regular rhythm.  Pulmonary/Chest: Effort normal.  CTA, diminished. O2 2L   Neurological: He is alert.  Skin: Skin is warm and dry.  Psychiatric: He has a normal mood and affect.     Lab Results:  CBC    Component Value Date/Time   WBC 8.8 04/29/2018 0539   RBC 4.63 04/29/2018 0539   HGB 10.1 (L) 04/29/2018 0539   HCT 35.8 (L) 04/29/2018 0539   PLT 254 04/29/2018 0539   MCV 77.3 (L) 04/29/2018 0539   MCH 21.8 (L) 04/29/2018 9833  MCHC 28.2 (L) 04/29/2018 0539   RDW 18.1 (H) 04/29/2018 0539   LYMPHSABS 1.8 04/25/2018 1302   MONOABS 1.0 04/25/2018 1302   EOSABS 0.2 04/25/2018 1302   BASOSABS 0.1 04/25/2018 1302    BMET    Component Value Date/Time   NA 140 05/03/2018 0300   NA 142 03/26/2018 1511   K 4.2 05/03/2018 0300   CL 93 (L) 05/03/2018 0300   CO2 35 (H) 05/03/2018 0300   GLUCOSE 193 (H) 05/03/2018 0300   BUN 55 (H) 05/03/2018 0300   BUN 48 (H) 03/26/2018 1511   CREATININE 1.00 05/03/2018 0300   CREATININE 1.00 03/06/2016 1242   CALCIUM 9.1 05/03/2018 0300   GFRNONAA >60 05/03/2018 0300   GFRNONAA >89 03/08/2015 1559   GFRAA >60 05/03/2018 0300   GFRAA >89 03/08/2015 1559    BNP    Component Value Date/Time   BNP 52.1 04/25/2018 1302    ProBNP    Component Value Date/Time   PROBNP 32.0 10/06/2010 1237    Imaging: Dg Chest 2 View  Result Date: 05/18/2018 CLINICAL DATA:  Follow-up pneumonia EXAM: CHEST - 2 VIEW COMPARISON:  05/01/2018 FINDINGS: The heart remains enlarged. Patchy airspace opacities throughout both lungs have improved. Lungs remain under aerated. No pneumothorax. No pleural effusion. IMPRESSION: Improving bilateral airspace opacities. Electronically Signed   By:  Marybelle Killings M.D.   On: 05/18/2018 10:47   Ct Angio Chest Pe W Or Wo Contrast  Result Date: 04/25/2018 CLINICAL DATA:  Shortness of breath. EXAM: CT ANGIOGRAPHY CHEST WITH CONTRAST TECHNIQUE: Multidetector CT imaging of the chest was performed using the standard protocol during bolus administration of intravenous contrast. Multiplanar CT image reconstructions and MIPs were obtained to evaluate the vascular anatomy. CONTRAST:  117m ISOVUE-370 IOPAMIDOL (ISOVUE-370) INJECTION 76% COMPARISON:  Chest x-ray today. FINDINGS: Cardiovascular: Heart is enlarged. Aorta is normal caliber. No filling defects in the pulmonary arteries to suggest pulmonary emboli. Mediastinum/Nodes: No mediastinal, hilar, or axillary adenopathy. Lungs/Pleura: Bilateral airspace opacities are noted, right greater than left. Trace right effusion. Upper Abdomen: Imaging into the upper abdomen shows no acute findings. Musculoskeletal: Chest wall soft tissues are unremarkable. No acute bony abnormality. Review of the MIP images confirms the above findings. IMPRESSION: Severe bilateral airspace opacities, right greater than left could reflect asymmetric edema or infection. Mild cardiomegaly. No evidence of pulmonary embolus. Electronically Signed   By: KRolm BaptiseM.D.   On: 04/25/2018 21:30   Dg Chest Port 1 View  Result Date: 05/01/2018 CLINICAL DATA:  Respiratory failure EXAM: PORTABLE CHEST 1 VIEW COMPARISON:  04/30/2018 FINDINGS: Cardiac shadow is stable. Bilateral airspace opacities are again identified stable from the previous exam. No new focal abnormality is seen. No bony abnormality is noted. IMPRESSION: Stable airspace opacities bilaterally. Electronically Signed   By: MInez CatalinaM.D.   On: 05/01/2018 06:35   Dg Chest Port 1 View  Result Date: 04/30/2018 CLINICAL DATA:  Hypoxia, respiratory failure. EXAM: PORTABLE CHEST 1 VIEW COMPARISON:  Radiograph of April 28, 2018. FINDINGS: Stable cardiomegaly. Stable bilateral  diffuse airspace opacities are noted. No pneumothorax is noted. Bony thorax is unremarkable. IMPRESSION: Stable bilateral airspace opacities are noted. Electronically Signed   By: JMarijo Conception M.D.   On: 04/30/2018 08:52   Dg Chest Port 1 View  Result Date: 04/28/2018 CLINICAL DATA:  Increasing dyspnea this evening EXAM: PORTABLE CHEST 1 VIEW COMPARISON:  04/25/2018 CXR and CT FINDINGS: Worsening of bilateral airspace disease, more confluent in appearance and now extending  and involving more of the left upper lobe as well. Stable cardiomegaly. No acute osseous abnormality. IMPRESSION: Progression of bilateral airspace disease suggesting worsening CHF and/or pneumonia. Stable cardiomegaly. Electronically Signed   By: Ashley Royalty M.D.   On: 04/28/2018 23:54   Dg Chest Port 1 View  Result Date: 04/25/2018 CLINICAL DATA:  Pulmonary edema, shortness of breath EXAM: PORTABLE CHEST 1 VIEW COMPARISON:  04/25/2018 FINDINGS: Cardiomegaly. Diffuse bilateral airspace disease again noted, not significantly changed. No effusions or acute bony abnormality. IMPRESSION: Severe diffuse bilateral airspace disease with cardiomegaly. Opacities could reflect edema or infection. No change. Electronically Signed   By: Rolm Baptise M.D.   On: 04/25/2018 19:13   Dg Chest Port 1 View  Result Date: 04/25/2018 CLINICAL DATA:  Shortness of breath. EXAM: PORTABLE CHEST 1 VIEW COMPARISON:  09/30/2017. FINDINGS: 1349 hours. The cardio pericardial silhouette is enlarged. Vascular congestion with pulmonary edema pattern bilaterally. The visualized bony structures of the thorax are intact. Telemetry leads overlie the chest. IMPRESSION: Cardiomegaly with pulmonary edema. Electronically Signed   By: Misty Stanley M.D.   On: 04/25/2018 14:20     Assessment & Plan:   Pneumonia - Hospital admission from 09/21-09/29 for resp failure and CAP. Treated with IV abx and diuretics, did not require intubation. - Repeat CXR 10/14- showed  improving bilateral airspace opacities   Respiratory failure (HCC) - Stable; O2 sat 99% 2L - Continues with Trilogy at night   Asthma - Continue with Flovent inhaler 2 puffs twice daily - Albuterol nebulizer 1 vial once daily - Proair inhaler 2 puffs every 6 hours as needed for shortness of breath wheezing - Enc patient to stay active - FU with Dr. Lamonte Sakai in 3 months      Martyn Ehrich, NP 05/20/2018

## 2018-05-20 ENCOUNTER — Encounter: Payer: Self-pay | Admitting: Primary Care

## 2018-05-20 NOTE — Assessment & Plan Note (Signed)
-   Stable; O2 sat 99% 2L - Continues with Trilogy at night

## 2018-05-20 NOTE — Assessment & Plan Note (Addendum)
-   Hospital admission from 09/21-09/29 for resp failure and CAP. Treated with IV abx and diuretics, did not require intubation. - Repeat CXR 10/14- showed improving bilateral airspace opacities

## 2018-05-20 NOTE — Assessment & Plan Note (Addendum)
-   Continue with Flovent inhaler 2 puffs twice daily - Albuterol nebulizer 1 vial once daily - Proair inhaler 2 puffs every 6 hours as needed for shortness of breath wheezing - Enc patient to stay active - FU with Dr. Delton Coombes in 3 months

## 2018-05-29 ENCOUNTER — Other Ambulatory Visit: Payer: Self-pay | Admitting: Family Medicine

## 2018-05-29 ENCOUNTER — Other Ambulatory Visit: Payer: Self-pay | Admitting: *Deleted

## 2018-05-29 MED ORDER — FLUTICASONE PROPIONATE HFA 110 MCG/ACT IN AERO: 2.0000 | INHALATION_SPRAY | Freq: Two times a day (BID) | RESPIRATORY_TRACT | 12 refills | Status: DC

## 2018-06-02 ENCOUNTER — Ambulatory Visit (INDEPENDENT_AMBULATORY_CARE_PROVIDER_SITE_OTHER): Payer: Medicaid Other | Admitting: Family Medicine

## 2018-06-02 ENCOUNTER — Encounter: Payer: Self-pay | Admitting: Family Medicine

## 2018-06-02 ENCOUNTER — Other Ambulatory Visit: Payer: Self-pay

## 2018-06-02 VITALS — BP 122/74 | HR 110 | Temp 98.5°F | Ht 59.0 in | Wt 353.0 lb

## 2018-06-02 DIAGNOSIS — J45909 Unspecified asthma, uncomplicated: Secondary | ICD-10-CM | POA: Diagnosis not present

## 2018-06-02 DIAGNOSIS — E118 Type 2 diabetes mellitus with unspecified complications: Secondary | ICD-10-CM | POA: Diagnosis present

## 2018-06-02 LAB — POCT GLYCOSYLATED HEMOGLOBIN (HGB A1C): HBA1C, POC (CONTROLLED DIABETIC RANGE): 6.9 % (ref 0.0–7.0)

## 2018-06-02 NOTE — Progress Notes (Signed)
Subjective:    Patient ID: [name], male    DOB: [birth year], 22 y.o.   MRN: [id number]   CC: Hospital follow-up, shortness of breath  HPI  Hospital follow-up: Patient presents today with his mother for follow-up from a recent hospital stay for shortness of breath caused by pneumonia.  The mother states the patient has been doing pretty well over the last few days, but woke up this morning with shortness of breath.  Of note, the patient's younger cousins visited them this weekend they had a runny nose and congestion.  The patient is not having a runny nose, congestion, cough, or fever, but is having increased work of breathing from [employer] interpretation.  She did not give him his albuterol nebulizer this morning.  Of note, the patient has been satting at 2 L nasal cannula around 97 to 98%, but today has fallen to 92 to 95% O2 saturation on 2 L nasal cannula.  His most recent chest x-ray showed improving opacities in comparison to imaging from hospital stay, where imaging showed pneumonia.  Morbid obesity/acute weight gain: Mom is also concerned the patient has had a weight gain of about 10 lbs since 10/14.  Before the patient's hospitalization for pneumonia, the mother used to have them patient's meals prepped by another person.  She found out that the person prepping the meals was using salt in the food, so the mother decided to start prepping the patient's food herself.  She states she is not using salt in the food, and knows the patient should be limited to 1200 to 1500 cal daily due to Prader-Willi syndrome.  She states the patient is walking when he can in order to maintain his weight.  She is concerned the patient is holding onto fluid and this is the cause of his weight gain, as well as shortness of breath.  Of note, the patient's most recent echo from September 2019 shows normal ejection fraction.  T2DM: The patient has type 2 diabetes for which he takes metformin 1000mg  XR.  Today the  patient's HbA1c is 6.9%, down from 7.2% 2 months prior.  Asthma: Mother states patient is taking all of his medications as prescribed, except that she has not given the patient has albuterol nebulizer this morning.  She is unsure of the patient's current shortness of breath is due to an asthma exacerbation versus fluid retention versus other cause.  Patient is prescribed Flovent inhaler, albuterol nebulizer once daily, and pro-air inhaler every 6 hours as needed.   Review of Systems  Constitutional: Negative for chills and fever.  HENT: Positive for congestion. Negative for nosebleeds, sinus pain and sore throat.   Respiratory: Positive for shortness of breath. Negative for cough and sputum production.   Cardiovascular: Negative for chest pain.  Gastrointestinal: Negative for abdominal pain, nausea and vomiting.   Objective:  BP 122/74   Pulse (!) 110   Temp 98.5 F (36.9 C) (Oral)   Ht 4\' 11"  (1.499 m)   Wt (!) 353 lb (160.1 kg)   SpO2 95%   BMI 71.30 kg/m   Physical Exam  Constitutional: He appears well-developed and well-nourished.  Global developmental delay  HENT:  Head: Normocephalic and atraumatic.  Right Ear: External ear normal.  Left Ear: External ear normal.  Nose with bilateral erythema and edema  Eyes: Conjunctivae and EOM are normal.  Neck: Normal range of motion.  Cardiovascular: Regular rhythm and normal heart sounds.  Tachycardic to 110.  Difficult to palpate  pulses due to body habitus  Pulmonary/Chest: Effort normal. No respiratory distress. He has no wheezes.  Difficulty auscultating patient due to body habitus  Abdominal: Soft. Bowel sounds are normal.  Obese  Musculoskeletal: Normal range of motion.  Unable to assess for edema due to body habitus  Lymphadenopathy:    He has no cervical adenopathy.  Neurological: He is alert.  Skin: Skin is warm. Capillary refill takes less than 2 seconds.   Assessment & Plan:   Asthma - Continue with Flovent  inhaler 2 puffs twice daily - Albuterol nebulizer 1 vial once daily - Schedule Proair inhaler 2 puffs every 6 hours for shortness of breath wheezing - Encourage patient to stay active  Obesity, morbid (HCC) Obesity currently unstable, as patient has gained 10 pounds in about 2 weeks. -Referral for nutrition help -Gave mother information for "[employer] [facility] [employer] [facility]", a resource for helping with patients with Prader-Willi, [address], [place], [address]; [phone] -Mother instructed to continue to calorie restrict between 1215 100 cal daily, as well as reduce salt intake  Type 2 diabetes mellitus with complication, without long-term current use of insulin (HCC) -Continue 1000 mg metformin daily -Recheck HbA1c in 3 months    Return in about 3 days (around 06/05/2018), or if symptoms worsen or fail to improve.   Dr. Peggyann Shoals [facility], PGY-1

## 2018-06-02 NOTE — Assessment & Plan Note (Signed)
-   Continue with Flovent inhaler 2 puffs twice daily - Albuterol nebulizer 1 vial once daily - Schedule Proair inhaler 2 puffs every 6 hours for shortness of breath wheezing - Encourage patient to stay active

## 2018-06-02 NOTE — Patient Instructions (Addendum)
Thank you for coming in to see [name] today! Please see below to review our plan for today's visit:  1. Take all "as needed" meds on a scheduled basis. 2. Follow up with [name] again later this week. 3. Please call nutrition to schedule an appointment for dietary assistance and education.  Please call the clinic at [phone] if your symptoms worsen or you have any concerns. It was our pleasure to serve you!  Dr. Peggyann Shoals [facility]

## 2018-06-02 NOTE — Assessment & Plan Note (Signed)
Obesity currently unstable, as patient has gained 10 pounds in about 2 weeks. -Referral for nutrition help -Gave mother information for "[facility] [facility]", a resource for helping with patients with Prader-Willi, [address], [place], [address]; [phone] -Mother instructed to continue to calorie restrict between 1215 100 cal daily, as well as reduce salt intake

## 2018-06-02 NOTE — Assessment & Plan Note (Signed)
-  Continue 1000 mg metformin daily -Recheck HbA1c in 3 months

## 2018-06-05 ENCOUNTER — Ambulatory Visit (INDEPENDENT_AMBULATORY_CARE_PROVIDER_SITE_OTHER): Payer: Medicaid Other | Admitting: Family Medicine

## 2018-06-05 ENCOUNTER — Other Ambulatory Visit: Payer: Self-pay

## 2018-06-05 DIAGNOSIS — J9610 Chronic respiratory failure, unspecified whether with hypoxia or hypercapnia: Secondary | ICD-10-CM

## 2018-06-05 NOTE — Assessment & Plan Note (Addendum)
Patient here for follow-up for decrease in oxygen saturation.  However patient very well-appearing today on exam.  Patient satting 96% on his normal 2 L.  Dad reports they have not had to increase any of his oxygen requirements.  Patient denies any shortness of breath, cough, fevers.  Patient and dad have no concerns at this time.

## 2018-06-05 NOTE — Assessment & Plan Note (Signed)
Secondary to Prader-Willi syndrome.  Mom and PCP to discuss possible inpatient management of his morbid obesity.  However patient has lost 3 pounds since his visit 3 days ago.  Dad given instruction to follow-up with nutritionist.

## 2018-06-05 NOTE — Progress Notes (Signed)
  Subjective:    Patient ID: [name], male    DOB: Dec 18, 1995, 22 y.o.   MRN: [id number]   CC: Follow-up for respiratory status  HPI: [name] is a 22 y.o. male presenting with follow up of oxygen saturation. PMH is significant for Prader-Willi Syndrome, Morbid Obesity, OHS, HTN, Chronic Respiratory Failure, Raynaud's Syndrome, Asthma, T2DM, and Gout.  Chronic respiratory failure: Patient seen in clinic on 10/29 and instructed to return in 3 days given that oxygen saturation was between 92 and 95%.  Today in clinic patient is very well-appearing and reports no increased shortness of breath.  Dad and patient plan to go bowling later today.  Patient is satting 96% on 2 L nasal cannula.  Dad has no concerns at this time.  Prader-Willi syndrome with morbid obesity: Mom voiced interest in inpatient location in order to help with weight loss.  Patient has lost 3 pounds in 3 days.  Dad reports that he is not interested in discussing any inpatient treatment of weight management and that this could be followed up by mom and PCP.  Smoking status reviewed  ROS: 10 point ROS is otherwise negative, except as mentioned in HPI  Patient Active Problem List   Diagnosis Date Noted  . Pulmonary edema 04/25/2018  . Flash pulmonary edema (HCC)   . Acute gout of right foot 03/27/2018  . Acute pain of right foot 03/02/2018  . Congestive heart failure (HCC)   . Cellulitis of abdominal wall 08/08/2017  . Calculus of gallbladder without cholecystitis without obstruction 05/07/2017  . Diastolic congestive heart failure (HCC)   . Tremor bilateral hands 10/27/2014  . Type 2 diabetes mellitus with complication, without long-term current use of insulin (HCC) 09/27/2014  . Respiratory failure (HCC) 09/15/2014  . Allergic rhinitis 01/07/2013  . Obesity hypoventilation syndrome (HCC) 10/05/2010  . VISUAL IMPAIRMENT 11/13/2009  . RAYNAUD'S SYNDROME 08/10/2009  . Essential hypertension, benign 01/07/2008   . ORTHOSTATIC PROTEINURIA 01/07/2008  . Obesity, morbid (HCC) 02/27/2007  . Prader-Willi syndrome and diabetes (HCC) 02/27/2007  . Asthma 10/16/2006  . SYMPTOM, ENURESIS, NOCTURNAL 10/16/2006     Objective:  BP 134/68   Pulse (!) 110   Temp 97.6 F (36.4 C) (Oral)   Wt (!) 352 lb (159.7 kg)   SpO2 96%   BMI 71.10 kg/m  Vitals and nursing note reviewed  General: NAD, pleasant, well-appearing, morbidly obese Cardiac: RRR, normal heart sounds Respiratory: CTAB, good air exchange, normal effort on 2 L, exam difficult due to body habitus Extremities: no edema or cyanosis. WWP. Skin: warm, no rashes noted Neuro: alert   Assessment & Plan:    Respiratory failure [facility]) Patient here for follow-up for decrease in oxygen saturation.  However patient very well-appearing today on exam.  Patient satting 96% on his normal 2 L.  Dad reports they have not had to increase any of his oxygen requirements.  Patient denies any shortness of breath, cough, fevers.  Patient and dad have no concerns at this time.  Obesity, morbid (HCC) Secondary to Prader-Willi syndrome.  Mom and PCP to discuss possible inpatient management of his morbid obesity.  However patient has lost 3 pounds since his visit 3 days ago.  Dad given instruction to follow-up with nutritionist.    [place] Leandria Thier, DO Family Medicine Resident PGY-2

## 2018-06-05 NOTE — Patient Instructions (Signed)
Thank you for coming to see me today. It was a pleasure! Today we talked about:   As discussed at your previous visit: Take all "as needed" meds on a scheduled basis. Please call nutrition to schedule an appointment for dietary assistance and education.  Today your oxygen level is good and you appear well! Please do not hesitate to come back in if you are short of breath, develop fevers, or have anything you are worried about.  Please follow-up with your primary care provider in 3 months or as needed.  If you have any questions or concerns, please do not hesitate to call the office at [phone].  Take Care,   [place] Terissa Haffey, DO

## 2018-06-08 ENCOUNTER — Telehealth: Payer: Self-pay | Admitting: *Deleted

## 2018-06-08 NOTE — Telephone Encounter (Signed)
Pt mom thinks that he has gout in his foot.  He is dragging it and c/o of pain.  Advised mom that I have no appts for tomorrow.  She can take him to urgent care or call back in the am to see if we have openings.  She wants to know if she can give colchicine that she has left over.  Advised since he is already on allopurinol daily that we would need to check with the MD first.  Will forward to MD.  [name], Maryjo Rochester, CMA

## 2018-06-09 ENCOUNTER — Telehealth: Payer: Self-pay | Admitting: Family Medicine

## 2018-06-09 NOTE — Telephone Encounter (Signed)
Spoke with pt to remind them of their upcoming appt in [facility] on 06/10/18. Confirmed they would still make it. -CH

## 2018-06-10 ENCOUNTER — Other Ambulatory Visit: Payer: Self-pay | Admitting: Family Medicine

## 2018-06-10 ENCOUNTER — Other Ambulatory Visit: Payer: Self-pay

## 2018-06-10 ENCOUNTER — Ambulatory Visit (INDEPENDENT_AMBULATORY_CARE_PROVIDER_SITE_OTHER): Payer: Medicaid Other | Admitting: Family Medicine

## 2018-06-10 VITALS — BP 132/80 | HR 116 | Temp 98.2°F | Ht 59.0 in | Wt 350.4 lb

## 2018-06-10 DIAGNOSIS — I10 Essential (primary) hypertension: Secondary | ICD-10-CM | POA: Diagnosis not present

## 2018-06-10 DIAGNOSIS — M109 Gout, unspecified: Secondary | ICD-10-CM | POA: Diagnosis not present

## 2018-06-10 MED ORDER — AMLODIPINE BESYLATE 10 MG PO TABS: 10.0000 mg | ORAL_TABLET | Freq: Every day | ORAL | 3 refills | Status: DC

## 2018-06-10 MED ORDER — ALLOPURINOL 100 MG PO TABS: 100.0000 mg | ORAL_TABLET | Freq: Every day | ORAL | 2 refills | Status: DC

## 2018-06-10 MED ORDER — POLYETHYLENE GLYCOL 3350 17 G PO PACK: 17.0000 g | PACK | Freq: Every day | ORAL | 0 refills | Status: DC | PRN

## 2018-06-10 MED ORDER — ALBUTEROL SULFATE (2.5 MG/3ML) 0.083% IN NEBU: INHALATION_SOLUTION | RESPIRATORY_TRACT | 12 refills | Status: DC

## 2018-06-10 MED ORDER — COLCHICINE 0.6 MG PO TABS: ORAL_TABLET | ORAL | 0 refills | Status: DC

## 2018-06-10 MED ORDER — FLUTICASONE PROPIONATE 50 MCG/ACT NA SUSP: 1.0000 | Freq: Every day | NASAL | 0 refills | Status: DC

## 2018-06-10 MED ORDER — OLOPATADINE HCL 0.1 % OP SOLN: 1.0000 [drp] | Freq: Two times a day (BID) | OPHTHALMIC | 0 refills | Status: DC | PRN

## 2018-06-10 MED ORDER — OXYBUTYNIN CHLORIDE 5 MG PO TABS: 10.0000 mg | ORAL_TABLET | Freq: Every day | ORAL | 2 refills | Status: DC

## 2018-06-10 MED ORDER — ALBUTEROL SULFATE HFA 108 (90 BASE) MCG/ACT IN AERS: INHALATION_SPRAY | RESPIRATORY_TRACT | 0 refills | Status: DC

## 2018-06-10 MED ORDER — MONTELUKAST SODIUM 10 MG PO TABS: 10.0000 mg | ORAL_TABLET | Freq: Every day | ORAL | 3 refills | Status: DC

## 2018-06-10 MED ORDER — FLUTICASONE PROPIONATE HFA 110 MCG/ACT IN AERO: 2.0000 | INHALATION_SPRAY | Freq: Two times a day (BID) | RESPIRATORY_TRACT | 12 refills | Status: DC

## 2018-06-10 MED ORDER — TORSEMIDE 20 MG PO TABS: 40.0000 mg | ORAL_TABLET | Freq: Two times a day (BID) | ORAL | 3 refills | Status: DC

## 2018-06-10 MED ORDER — METFORMIN HCL ER 500 MG PO TB24: 1000.0000 mg | ORAL_TABLET | Freq: Every day | ORAL | 3 refills | Status: DC

## 2018-06-10 MED ORDER — ALLOPURINOL 300 MG PO TABS: 300.0000 mg | ORAL_TABLET | Freq: Every day | ORAL | 2 refills | Status: DC

## 2018-06-10 MED ORDER — PANTOPRAZOLE SODIUM 20 MG PO TBEC: DELAYED_RELEASE_TABLET | ORAL | 2 refills | Status: DC

## 2018-06-10 MED ORDER — DESMOPRESSIN ACETATE 0.2 MG PO TABS: 0.4000 mg | ORAL_TABLET | Freq: Every day | ORAL | 2 refills | Status: DC

## 2018-06-10 MED ORDER — CALCIUM CARBONATE ANTACID 500 MG PO CHEW: 2.0000 | CHEWABLE_TABLET | Freq: Three times a day (TID) | ORAL | 3 refills | Status: AC | PRN

## 2018-06-10 MED ORDER — CETIRIZINE HCL 10 MG PO TABS: 10.0000 mg | ORAL_TABLET | Freq: Every day | ORAL | 3 refills | Status: DC

## 2018-06-10 NOTE — Progress Notes (Signed)
  Subjective:    Patient ID: [name], male    DOB: [birth year], 22 y.o.   MRN: [id number]   CC: Right great toe gout flare  HPI: Gurman Ashland Roachis a 22 y.o.malepresenting with  gout flareup. PMH is significant forPrader-Willi Syndrome, Morbid Obesity, OHS, HTN, Chronic Respiratory Failure, Raynaud's Syndrome, Asthma, T2DM, and Gout.  Right great toe gout flare: Patient presenting today with flare of his recently diagnosed gout.  Uric acid obtained in August elevated to 16.  Patient started on colchicine 0.6 daily to take for 2 to 3 months along with allopurinol 100 mg to take daily.  Patient however hospitalized in September and colchicine was stopped at that time.  Patient now reporting pain in right great toe joint that started on Saturday with a limp.  Patient reports he has high pain tolerance is not experiencing too much pain but it is causing him to limp and be unable to participate in his normal daily activities.  Patient denies any trauma to his toe ROS: Denies fever, chest pain, shortness of breath Smoking status reviewed  Mom also requesting refill on all of patient's medications.  ROS: 10 point ROS is otherwise negative, except as mentioned in HPI  Patient Active Problem List   Diagnosis Date Noted  . Pulmonary edema 04/25/2018  . Flash pulmonary edema (HCC)   . Acute gout involving toe of right foot 03/27/2018  . Acute pain of right foot 03/02/2018  . Congestive heart failure (HCC)   . Cellulitis of abdominal wall 08/08/2017  . Calculus of gallbladder without cholecystitis without obstruction 05/07/2017  . Diastolic congestive heart failure (HCC)   . Tremor bilateral hands 10/27/2014  . Type 2 diabetes mellitus with complication, without long-term current use of insulin (HCC) 09/27/2014  . Respiratory failure (HCC) 09/15/2014  . Allergic rhinitis 01/07/2013  . Obesity hypoventilation syndrome (HCC) 10/05/2010  . VISUAL IMPAIRMENT 11/13/2009  . RAYNAUD'S SYNDROME  08/10/2009  . Essential hypertension, benign 01/07/2008  . ORTHOSTATIC PROTEINURIA 01/07/2008  . Obesity, morbid (HCC) 02/27/2007  . Prader-Willi syndrome and diabetes (HCC) 02/27/2007  . Asthma 10/16/2006  . SYMPTOM, ENURESIS, NOCTURNAL 10/16/2006     Objective:  BP 132/80   Pulse (!) 116   Temp 98.2 F (36.8 C) (Oral)   Ht 4\' 11"  (1.499 m)   Wt (!) 350 lb 6.4 oz (158.9 kg)   SpO2 97%   BMI 70.77 kg/m  Vitals and nursing note reviewed  General: NAD, pleasant, morbidly obese, well-appearing Respiratory: normal effort on 2 L nasal cannula Extremities: no edema or cyanosis. WWP.  Right great toe with heat to touch and some mild erythema noted.  Unable to tell if there is significant swelling giving body habitus. Skin: warm and dry, no rashes noted Neuro: alert and oriented Psych: normal affect  Assessment & Plan:    Acute gout involving toe of right foot Patient presenting with gout of right great toe.  Will increase allopurinol to 300 mg nightly given normal renal function.  We will also give colchicine 1.2 mg to take now and then 0.6 mg to take 1 hour after taking the first 2 tablets.  Patient and caregiver counseled that he would need to continue his allopurinol even if gout gets better.  Given return precautions  Refilled all of patient's medications for hypertension, GERD, chronic respiratory failure...  [place] Zadok Holaway, DO Family Medicine Resident PGY-2

## 2018-06-10 NOTE — Patient Instructions (Addendum)
Thank you for coming to see me today. It was a pleasure! Today we talked about:   I have refilled all your medications. I have sent colchicine to your pharmacy. Please take 2 tablets now and then 1 tablet 1 hour later. I have increased your allopurinol to 300 mg daily to help prevent further gout flares.   Please follow-up with your regular doctor as needed.  If you have any questions or concerns, please do not hesitate to call the office at [phone].  Take Care,   [place] Kingslee Mairena, DO  Gout Gout is painful swelling that can happen in some of your joints. Gout is a type of arthritis. This condition is caused by having too much uric acid in your body. Uric acid is a chemical that is made when your body breaks down substances called purines. If your body has too much uric acid, sharp crystals can form and build up in your joints. This causes pain and swelling. Gout attacks can happen quickly and be very painful (acute gout). Over time, the attacks can affect more joints and happen more often (chronic gout). Follow these instructions at home: During a Gout Attack  If directed, put ice on the painful area: ? Put ice in a plastic bag. ? Place a towel between your skin and the bag. ? Leave the ice on for 20 minutes, 2-3 times a day.  Rest the joint as much as possible. If the joint is in your leg, you may be given crutches to use.  Raise (elevate) the painful joint above the level of your heart as often as you can.  Drink enough fluids to keep your pee (urine) clear or pale yellow.  Take over-the-counter and prescription medicines only as told by your doctor.  Do not drive or use heavy machinery while taking prescription pain medicine.  Follow instructions from your doctor about what you can or cannot eat and drink.  Return to your normal activities as told by your doctor. Ask your doctor what activities are safe for you. Avoiding Future Gout Attacks  Follow a low-purine diet as  told by a specialist (dietitian) or your doctor. Avoid foods and drinks that have a lot of purines, such as: ? Liver. ? Kidney. ? Anchovies. ? Asparagus. ? Herring. ? Mushrooms ? Mussels. ? Beer.  Limit alcohol intake to no more than 1 drink a day for nonpregnant women and 2 drinks a day for men. One drink equals 12 oz of beer, 5 oz of wine, or 1 oz of hard liquor.  Stay at a healthy weight or lose weight if you are overweight. If you want to lose weight, talk with your doctor. It is important that you do not lose weight too fast.  Start or continue an exercise plan as told by your doctor.  Drink enough fluids to keep your pee clear or pale yellow.  Take over-the-counter and prescription medicines only as told by your doctor.  Keep all follow-up visits as told by your doctor. This is important. Contact a doctor if:  You have another gout attack.  You still have symptoms of a gout attack after10 days of treatment.  You have problems (side effects) because of your medicines.  You have chills or a fever.  You have burning pain when you pee (urinate).  You have pain in your lower back or belly. Get help right away if:  You have very bad pain.  Your pain cannot be controlled.  You cannot pee.  This information is not intended to replace advice given to you by your health care provider. Make sure you discuss any questions you have with your health care provider. Document Released: 04/30/2008 Document Revised: 12/28/2015 Document Reviewed: 05/04/2015 Elsevier Interactive Patient Education  [employer].

## 2018-06-10 NOTE — Assessment & Plan Note (Signed)
Patient presenting with gout of right great toe.  Will increase allopurinol to 300 mg nightly given normal renal function.  We will also give colchicine 1.2 mg to take now and then 0.6 mg to take 1 hour after taking the first 2 tablets.  Patient and caregiver counseled that he would need to continue his allopurinol even if gout gets better.  Given return precautions

## 2018-06-11 LAB — BASIC METABOLIC PANEL
BUN/Creatinine Ratio: 22 — ABNORMAL HIGH (ref 9–20)
BUN: 23 mg/dL — ABNORMAL HIGH (ref 6–20)
CALCIUM: 9.6 mg/dL (ref 8.7–10.2)
CO2: 33 mmol/L — AB (ref 20–29)
CREATININE: 1.06 mg/dL (ref 0.76–1.27)
Chloride: 88 mmol/L — ABNORMAL LOW (ref 96–106)
GFR calc Af Amer: 113 mL/min/{1.73_m2} (ref >59)
GFR, EST NON AFRICAN AMERICAN: 98 mL/min/{1.73_m2} (ref >59)
Glucose: 114 mg/dL — ABNORMAL HIGH (ref 65–99)
Potassium: 3.6 mmol/L (ref 3.5–5.2)
Sodium: 137 mmol/L (ref 134–144)

## 2018-06-17 ENCOUNTER — Ambulatory Visit: Payer: Medicaid Other | Admitting: Registered"

## 2018-06-29 ENCOUNTER — Other Ambulatory Visit: Payer: Self-pay | Admitting: Family Medicine

## 2018-07-21 ENCOUNTER — Ambulatory Visit: Payer: Medicaid Other | Admitting: Emergency Medicine

## 2018-07-22 ENCOUNTER — Encounter: Payer: Self-pay | Admitting: Emergency Medicine

## 2018-07-22 ENCOUNTER — Ambulatory Visit (INDEPENDENT_AMBULATORY_CARE_PROVIDER_SITE_OTHER): Payer: Medicaid Other | Admitting: Emergency Medicine

## 2018-07-22 DIAGNOSIS — J45909 Unspecified asthma, uncomplicated: Secondary | ICD-10-CM

## 2018-07-22 DIAGNOSIS — E662 Morbid (severe) obesity with alveolar hypoventilation: Secondary | ICD-10-CM

## 2018-07-22 NOTE — Assessment & Plan Note (Signed)
Suspected obstructive lung disease.  No flares, no hospitalizations.  He is tolerating Flovent, uses albuterol as needed with the assistance of his mom.  No changes for now.  His flu shot is up-to-date.

## 2018-07-22 NOTE — Patient Instructions (Signed)
Please continue to wear your oxygen at 2 L/min when exerting.  We would like to get you back into the swimming pool and water aerobics.  You would need to have your oxygen level checked about every 10 minutes to ensure that is not dropping below 88%.  If so then we would need to stop the exercise. Please continue to use the trilogy ventilator every night while sleeping.  Call our office if you have any trouble with the equipment or difficulty using it. Flu shot is up-to-date. Continue Flovent 2 puffs twice a day.  Remember to rinse and gargle after using. Keep albuterol available to use 2 puffs if needed for shortness of breath, chest tightness, wheezing. Follow with Dr Delton CoombesByrum in 6 months or sooner if you have any problems

## 2018-07-22 NOTE — Assessment & Plan Note (Signed)
With nighttime ventilator dependence and daytime oxygen dependence.  His trilogy ventilator is in good repair, working well.  He has great compliance and is benefiting.  They will let me know if there is any problems with the ventilator going forward.  I do not want to change his mask at this time since he is doing so well.  With regard to his oxygen 2 L/min appears to be adequate when he is exerting during the day.  He would very much like to get back into his water aerobics but he cannot wear oxygen when he is doing this.  It may be that he will have to lose more weight before he can go back to swimming.  I think it is okay for them to do closely supervise trial of him in the pool on room air, check his oxygen saturations frequently to see if he maintains greater than 88%.  They understand that if he does desaturated then the water aerobics are not an option right now.

## 2018-07-22 NOTE — Progress Notes (Signed)
Subjective:    Patient ID: [name], male    DOB: [birth year], 22 y.o.   MRN: [id number]   Asthma  He complains of shortness of breath and wheezing. There is no cough. Associated symptoms include postnasal drip. Pertinent negatives include no ear pain, fever, headaches, rhinorrhea, sneezing, sore throat or trouble swallowing. His past medical history is significant for asthma.   ROV 07/22/18 --TJ is a very pleasant 22 year old man with Prader-Willi syndrome, associated progressive obesity.  He is followed here for severe OSA/OHS and possible obstructive lung disease.  He has been managed on a trilogy ventilator, oxygen at 2 L/min during the day.  He was unfortunately admitted back in late September for pneumonia.  He returns today with his friend and caretaker CJ.  They report that he is doing well, great compliance. He appears to breathe comfortably during the day. He can sometimes desaturate with ambulation, quickly recovers, on 2L/min. He has lost 8 lbs since 1 month ago. He remains on singulair, flovent. He uses albuterol as needed, probably daily. No flares, no hospitalization. He has had the flu shot. Wants to get back to the pool, water exercise.    Review of Systems  Constitutional: Negative for fever and unexpected weight change.  HENT: Positive for congestion, postnasal drip and sinus pressure. Negative for dental problem, ear pain, nosebleeds, rhinorrhea, sneezing, sore throat and trouble swallowing.   Eyes: Negative for redness and itching.  Respiratory: Positive for shortness of breath and wheezing. Negative for cough and chest tightness.   Cardiovascular: Negative for palpitations and leg swelling.  Gastrointestinal: Negative for nausea and vomiting.  Genitourinary: Negative for dysuria.  Musculoskeletal: Negative for joint swelling.  Skin: Negative for rash.  Neurological: Negative for headaches.  Hematological: Does not bruise/bleed easily.  Psychiatric/Behavioral: Negative  for dysphoric mood. The patient is not nervous/anxious.        Objective:   Physical Exam Vitals:   07/22/18 1148  BP: 122/74  Pulse: 95  SpO2: 99%   Gen: Pleasant, morbidly obese, in no distress, Interacting and answering questions. O2 in place  ENT: No lesions,  mouth clear,  oropharynx clear, no active  postnasal drip  Neck: No JVD, no stridor  Lungs: very distant, clear superiorly.   Cardiovascular: RRR, heart sounds normal, no murmur or gallops, no peripheral edema  Musculoskeletal: No deformities, no cyanosis or clubbing  Neuro: alert, answers simple questions, moves all ext, faces consistent w his hx P-W syndrome.   Skin: Warm, no lesions or rashes     Assessment & Plan:  Obesity hypoventilation syndrome (HCC) With nighttime ventilator dependence and daytime oxygen dependence.  His trilogy ventilator is in good repair, working well.  He has great compliance and is benefiting.  They will let me know if there is any problems with the ventilator going forward.  I do not want to change his mask at this time since he is doing so well.  With regard to his oxygen 2 L/min appears to be adequate when he is exerting during the day.  He would very much like to get back into his water aerobics but he cannot wear oxygen when he is doing this.  It may be that he will have to lose more weight before he can go back to swimming.  I think it is okay for them to do closely supervise trial of him in the pool on room air, check his oxygen saturations frequently to see if he maintains greater than 88%.  They understand that if he does desaturated then the water aerobics are not an option right now.  Asthma Suspected obstructive lung disease.  No flares, no hospitalizations.  He is tolerating Flovent, uses albuterol as needed with the assistance of his mom.  No changes for now.  His flu shot is up-to-date.  Levy Pupaobert Vennessa Affinito, MD, PhD 07/22/2018, 12:35 PM [facility] [phone] or if no answer [phone]

## 2018-07-23 ENCOUNTER — Ambulatory Visit
Admission: RE | Admit: 2018-07-23 | Discharge: 2018-07-23 | Disposition: A | Discharge status: Home / Self Care | Payer: Medicaid Other | Source: Ambulatory Visit | Attending: Family Medicine | Admitting: Family Medicine

## 2018-07-23 ENCOUNTER — Encounter: Payer: Self-pay | Admitting: Family Medicine

## 2018-07-23 ENCOUNTER — Other Ambulatory Visit: Payer: Self-pay | Admitting: Family Medicine

## 2018-07-23 ENCOUNTER — Ambulatory Visit (INDEPENDENT_AMBULATORY_CARE_PROVIDER_SITE_OTHER): Payer: Medicaid Other | Admitting: Family Medicine

## 2018-07-23 VITALS — BP 115/70 | HR 102 | Temp 98.2°F

## 2018-07-23 DIAGNOSIS — M25561 Pain in right knee: Secondary | ICD-10-CM | POA: Diagnosis not present

## 2018-07-23 NOTE — Assessment & Plan Note (Signed)
Knee exam very limited due to body habitus and patient cooperation.  Does have point tenderness of palpation of right patella and history of injury to the area.  Will opt for imaging of the right knee including patellar imaging.  Renal function in November was within normal limits. -X-ray right knee -Ibuprofen 600 mg 3 times daily x3 days, then as needed -Advised patient to rest as much as possible and ice right knee for at least 5 to 10 minutes at least 3 times a day. -Patient to return if worsens or does not improve

## 2018-07-23 NOTE — Progress Notes (Signed)
Subjective: No chief complaint on file.    HPI: [name] is a 22 y.o. with past medical history significant for Prader-Willi syndrome and morbid obesity presenting to clinic today to discuss the following:  Larey SeatFell about 1 week ago and hit his right knee.  He is unable to describe the mechanism of injury.  He denies any pain at the onset of injury and has not noticed any pain since that time.  His cousin reports that the patient's mother made an appointment as she has been worried that he has been limping since the injury.  His mom has been giving him ibuprofen as needed.  He states that he has not been taking it every day, just occasionally.  He states that touching his knee makes the pain worse, but that nothing makes it better, although does state that ibuprofen helps with his walking.  He states that the pain does not bother him while walking.  Denies numbness and tingling.  Denies weakness.  Denies swelling.  No right hip pain, no right ankle pain.  Here with cousin.      ROS noted in HPI.   Past Medical, Surgical, Social, and Family History Reviewed & Updated per EMR.   Pertinent Historical Findings include:   Social History   Tobacco Use  Smoking Status Never Smoker  Smokeless Tobacco Never Used      Objective: BP 115/70   Pulse (!) 102   Temp 98.2 F (36.8 C) (Oral)   SpO2 97%  Vitals and nursing notes reviewed  Physical Exam: General: 22 y.o. male in NAD Lungs: O2 per [place], no increased work of breathing Skin: Warm and dry Right leg: No increased swelling, warmth.  Tenderness to palpation of right patella only.  Exam very limited due to body habitus and cooperation of patient.  Sensation intact throughout right lower extremity.  Pain with extension of right knee.  Walks with antalgic gait on right. Left leg: No increased swelling, warmth.   No results found for this or any previous visit (from the past 72 hour(s)).  Assessment/Plan:  Knee pain, right  anterior Knee exam very limited due to body habitus and patient cooperation.  Does have point tenderness of palpation of right patella and history of injury to the area.  Will opt for imaging of the right knee including patellar imaging.  Renal function in November was within normal limits. -X-ray right knee -Ibuprofen 600 mg 3 times daily x3 days, then as needed -Advised patient to rest as much as possible and ice right knee for at least 5 to 10 minutes at least 3 times a day. -Patient to return if worsens or does not improve     PATIENT EDUCATION PROVIDED: See AVS    Diagnosis and plan along with any newly prescribed medication(s) were discussed in detail with this patient today. The patient verbalized understanding and agreed with the plan. Patient advised if symptoms worsen return to clinic or ER.   Health Maintainance:   Orders Placed This Encounter  Procedures  . DG Knee 4 Views W/Patella Right    Standing Status:   Future    Standing Expiration Date:   09/24/2019    Order Specific Question:   Reason for Exam (SYMPTOM  OR DIAGNOSIS REQUIRED)    Answer:   right knee pain with point tenderness of patella    Order Specific Question:   Preferred imaging location?    Answer:   [id number]  Order Specific Question:   Radiology Contrast Protocol - do NOT remove file path    Answer:   [email or web address]    No orders of the defined types were placed in this encounter.    Luis AbedBailey Seymone Forlenza, DO 07/23/2018, 3:04 PM PGY-1 [facility]

## 2018-07-23 NOTE — Patient Instructions (Addendum)
It was great to meet you!  I'm sorry that you fell and that your knee is bothering you.  I have ordered some X-rays.  Please get them done and we will call you with the results.  In the meantime, if it is hurting, you can take 600mg  of ibuprofen three times a day for the next three days.  These use it as needed.  This may help decrease the inflammation in your knee.  Try to rest your knee as much as possible, keep it elevated, and ice it at least 5-10 minutes at least three times a day.  If it worsens, or does not improve, please call the office.  Best, Dr. Fredric MareBailey

## 2018-08-14 ENCOUNTER — Ambulatory Visit (INDEPENDENT_AMBULATORY_CARE_PROVIDER_SITE_OTHER): Payer: Medicaid Other | Admitting: Family Medicine

## 2018-08-14 VITALS — BP 122/62 | HR 103 | Temp 98.2°F | Wt 330.2 lb

## 2018-08-14 DIAGNOSIS — M109 Gout, unspecified: Secondary | ICD-10-CM | POA: Insufficient documentation

## 2018-08-14 MED ORDER — ALLOPURINOL 100 MG PO TABS: 100.0000 mg | ORAL_TABLET | Freq: Every day | ORAL | 3 refills | Status: DC

## 2018-08-14 MED ORDER — COLCHICINE 0.6 MG PO TABS: ORAL_TABLET | ORAL | 0 refills | Status: DC

## 2018-08-14 NOTE — Assessment & Plan Note (Signed)
Patient has acute gout flare likely a result of dietary intake of seafood. Treat with colchine during acute flare. Discussed with mother that since patient has had multiple that recommend increasing allopurinol to 400mg  qd after complete resolution of flare and recheck uric acid level in 2-4 weeks. She would like level checked today as well. Discussed that goal uric acid level not during flare would be <6 and that further titration of allopurinol or second urate lowering agent may be necessary. Mother was also educated on low purine diet.

## 2018-08-14 NOTE — Patient Instructions (Addendum)
During acute flare: Take colchicine 2 tablets now and then 1 tablet an hour later. If still having pain then the next day he can take 1 tablet daily until the pain resolves. Then stop.   After the flare is over Wait 2-3 days and then increase allopurinol to 400mg  daily. He needs a repeat uric acid level bloodwork done in 4 weeks   Low-Purine Eating Plan A low-purine eating plan involves making food choices to limit your intake of purine. Purine is a kind of uric acid. Too much uric acid in your blood can cause certain conditions, such as gout and kidney stones. Eating a low-purine diet can help control these conditions. What are tips for following this plan? Reading food labels   Avoid foods with saturated or Trans fat.  Check the ingredient list of grains-based foods, such as bread and cereal, to make sure that they contain whole grains.  Check the ingredient list of sauces or soups to make sure they do not contain meat or fish.  When choosing soft drinks, check the ingredient list to make sure they do not contain high-fructose corn syrup. Shopping  Buy plenty of fresh fruits and vegetables.  Avoid buying canned or fresh fish.  Buy dairy products labeled as low-fat or nonfat.  Avoid buying premade or processed foods. These foods are often high in fat, salt (sodium), and added sugar. Cooking  Use olive oil instead of butter when cooking. Oils like olive oil, canola oil, and sunflower oil contain healthy fats. Meal planning  Learn which foods do or do not affect you. If you find out that a food tends to cause your gout symptoms to flare up, avoid eating that food. You can enjoy foods that do not cause problems. If you have any questions about a food item, talk with your dietitian or health care provider.  Limit foods high in fat, especially saturated fat. Fat makes it harder for your body to get rid of uric acid.  Choose foods that are lower in fat and are lean sources of  protein. General guidelines  Limit alcohol intake to no more than 1 drink a day for nonpregnant women and 2 drinks a day for men. One drink equals 12 oz of beer, 5 oz of wine, or 1 oz of hard liquor. Alcohol can affect the way your body gets rid of uric acid.  Drink plenty of water to keep your urine clear or pale yellow. Fluids can help remove uric acid from your body.  If directed by your health care provider, take a vitamin C supplement.  Work with your health care provider and dietitian to develop a plan to achieve or maintain a healthy weight. Losing weight can help reduce uric acid in your blood. What foods are recommended? The items listed may not be a complete list. Talk with your dietitian about what dietary choices are best for you. Foods low in purines Foods low in purines do not need to be limited. These include:  All fruits.  All low-purine vegetables, pickles, and olives.  Breads, pasta, rice, cornbread, and popcorn. Cake and other baked goods.  All dairy foods.  Eggs, nuts, and nut butters.  Spices and condiments, such as salt, herbs, and vinegar.  Plant oils, butter, and margarine.  Water, sugar-free soft drinks, tea, coffee, and cocoa.  Vegetable-based soups, broths, sauces, and gravies. Foods moderate in purines Foods moderate in purines should be limited to the amounts listed.   cup of asparagus, cauliflower, spinach,  mushrooms, or green peas, each day.  2/3 cup uncooked oatmeal, each day.   cup dry wheat bran or wheat germ, each day.  2-3 ounces of meat or poultry, each day.  4-6 ounces of shellfish, such as crab, lobster, oysters, or shrimp, each day.  1 cup cooked beans, peas, or lentils, each day.  Soup, broths, or bouillon made from meat or fish. Limit these foods as much as possible. What foods are not recommended? The items listed may not be a complete list. Talk with your dietitian about what dietary choices are best for you. Limit your  intake of foods high in purines, including:  Beer and other alcohol.  Meat-based gravy or sauce.  Canned or fresh fish, such as: ? Anchovies, sardines, herring, and tuna. ? Mussels and scallops. ? Codfish, trout, and haddock.  Tomasa Blase.  Organ meats, such as: ? Liver or kidney. ? Tripe. ? Sweetbreads (thymus gland or pancreas).  Wild Education officer, environmental.  Yeast or yeast extract supplements.  Drinks sweetened with high-fructose corn syrup. Summary  Eating a low-purine diet can help control conditions caused by too much uric acid in the body, such as gout or kidney stones.  Choose low-purine foods, limit alcohol, and limit foods high in fat.  You will learn over time which foods do or do not affect you. If you find out that a food tends to cause your gout symptoms to flare up, avoid eating that food. This information is not intended to replace advice given to you by your health care provider. Make sure you discuss any questions you have with your health care provider. Document Released: 11/16/2010 Document Revised: 09/04/2016 Document Reviewed: 09/04/2016 Elsevier Interactive Patient Education  2019 [employer].

## 2018-08-14 NOTE — Progress Notes (Signed)
    Subjective:  [name] is a 23 y.o. male who presents to the [facility] today with a chief complaint of R foot pain. Historian is caretaker and mother.   HPI:  Patient has been having right foot pain recently consistent with gout flare.  He has been compliant on his allopurinol 300 mg daily.  He seems to have gout flares after eating seafood.  He eats fish and shrimp occasionally.  This is a second gout flare since starting the allopurinol.  ROS: Per HPI. No fever/chills. No trauma. No LE edema. No rashes    Objective:  Physical Exam: BP 122/62   Pulse (!) 103   Temp 98.2 F (36.8 C) (Oral)   Wt (!) 330 lb 4 oz (149.8 kg)   SpO2 96%   BMI 66.70 kg/m   Gen: NAD, resting comfortably, morbidly obese, well appearing Pulm: NWOB with nasal cannula in place MSK: R foot TTP, unable to localize pain, with mild erythema. No edema. Skin: warm, dry. No rashes Neuro: awake, alert. moves all extremities  Assessment/Plan:  Acute gout of right foot Patient has acute gout flare likely a result of dietary intake of seafood. Treat with colchine during acute flare. Discussed with mother that since patient has had multiple that recommend increasing allopurinol to 400mg  qd after complete resolution of flare and recheck uric acid level in 2-4 weeks. She would like level checked today as well. Discussed that goal uric acid level not during flare would be <6 and that further titration of allopurinol or second urate lowering agent may be necessary. Mother was also educated on low purine diet.   Leland Her, DO PGY-3, [facility] 08/14/2018 1:57 PM

## 2018-08-15 LAB — URIC ACID: Uric Acid: 5.2 mg/dL (ref 3.7–8.6)

## 2018-08-18 ENCOUNTER — Encounter: Payer: Self-pay | Admitting: Family Medicine

## 2018-08-18 ENCOUNTER — Telehealth: Payer: Self-pay | Admitting: Family Medicine

## 2018-08-18 NOTE — Telephone Encounter (Signed)
Left message for mother that uric acid level at goal and that patient can stay on allopurinol 300mg  daily.

## 2018-08-18 NOTE — Telephone Encounter (Signed)
LVM. Stating the results of pt uric acid levels was at goal and to continue taking allopurinol 300mg  daily as stated by Dr. Artist Pais. Will try again later on today. Aquilla Solian, CMA

## 2018-08-18 NOTE — Addendum Note (Signed)
Addended by: Leland Her on: 08/18/2018 09:36 AM   Modules accepted: Orders

## 2018-08-28 ENCOUNTER — Telehealth: Payer: Self-pay | Admitting: Family Medicine

## 2018-08-28 NOTE — Telephone Encounter (Signed)
Standing orders form dropped off for [facility] at front desk for completion.  Verified that patient section of form has been completed.  Last DOS/WCC with PCP was 06/02/18.  Placed form in team folder to be completed by clinical staff.  [name]

## 2018-08-31 NOTE — Telephone Encounter (Signed)
Clinical info completed on standing orders form.  Place form in PCP's box for completion.  Aquilla Solian, CMA

## 2018-09-01 NOTE — Telephone Encounter (Signed)
Patient form completed and put in Nurses' Form Box.  Peggyann Shoals, DO [facility], PGY-1 09/01/2018 1:00 PM

## 2018-09-01 NOTE — Telephone Encounter (Signed)
Patient mother aware form ready for pick up at front desk. Copy made for batch scanning. Ples Specter, RN [facility] RN)

## 2018-09-26 IMAGING — DX DG CHEST 2V
2 series · 2 of 2 positions shown · non-contrast
Comparison: 08/28/2017

CLINICAL DATA: Shortness of breath, follow-up pneumonia, history
hypertension, diabetes mellitus, asthma

EXAM:
CHEST  2 VIEW

[chest pa]
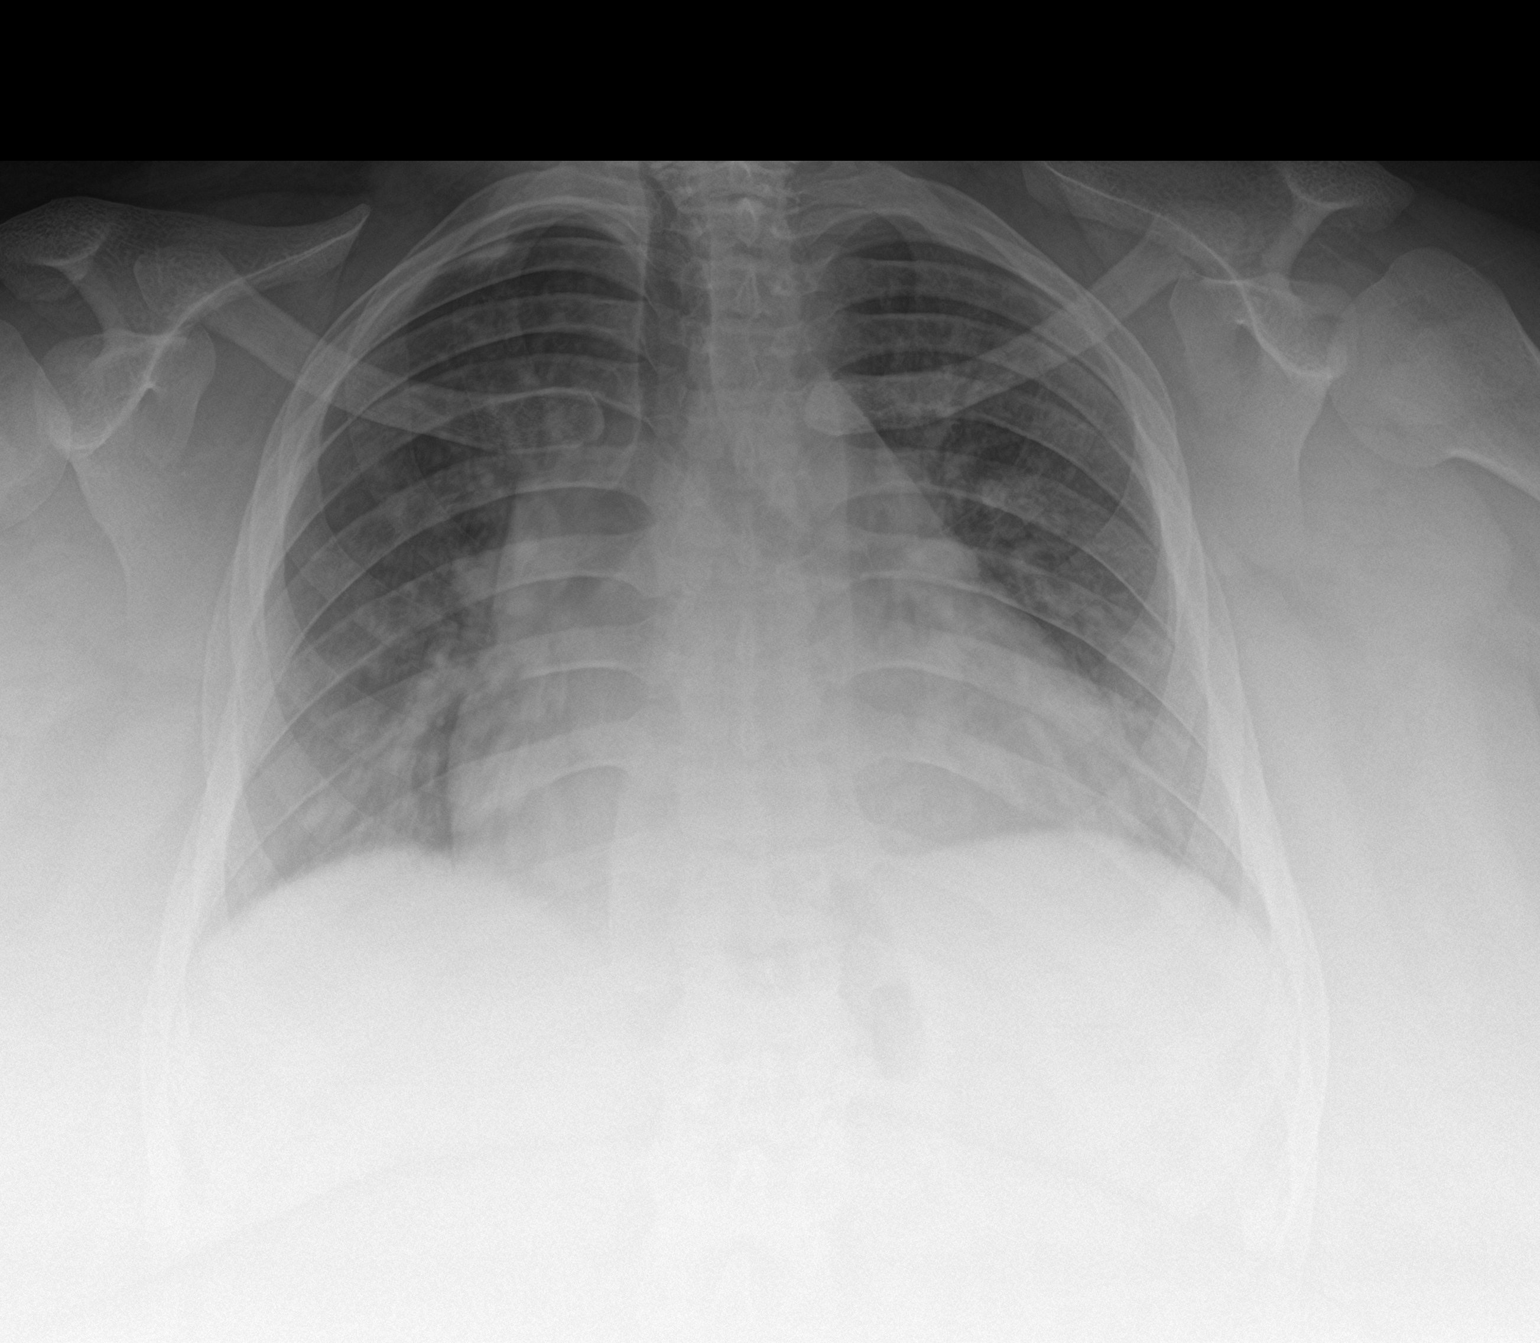

[chest lat]
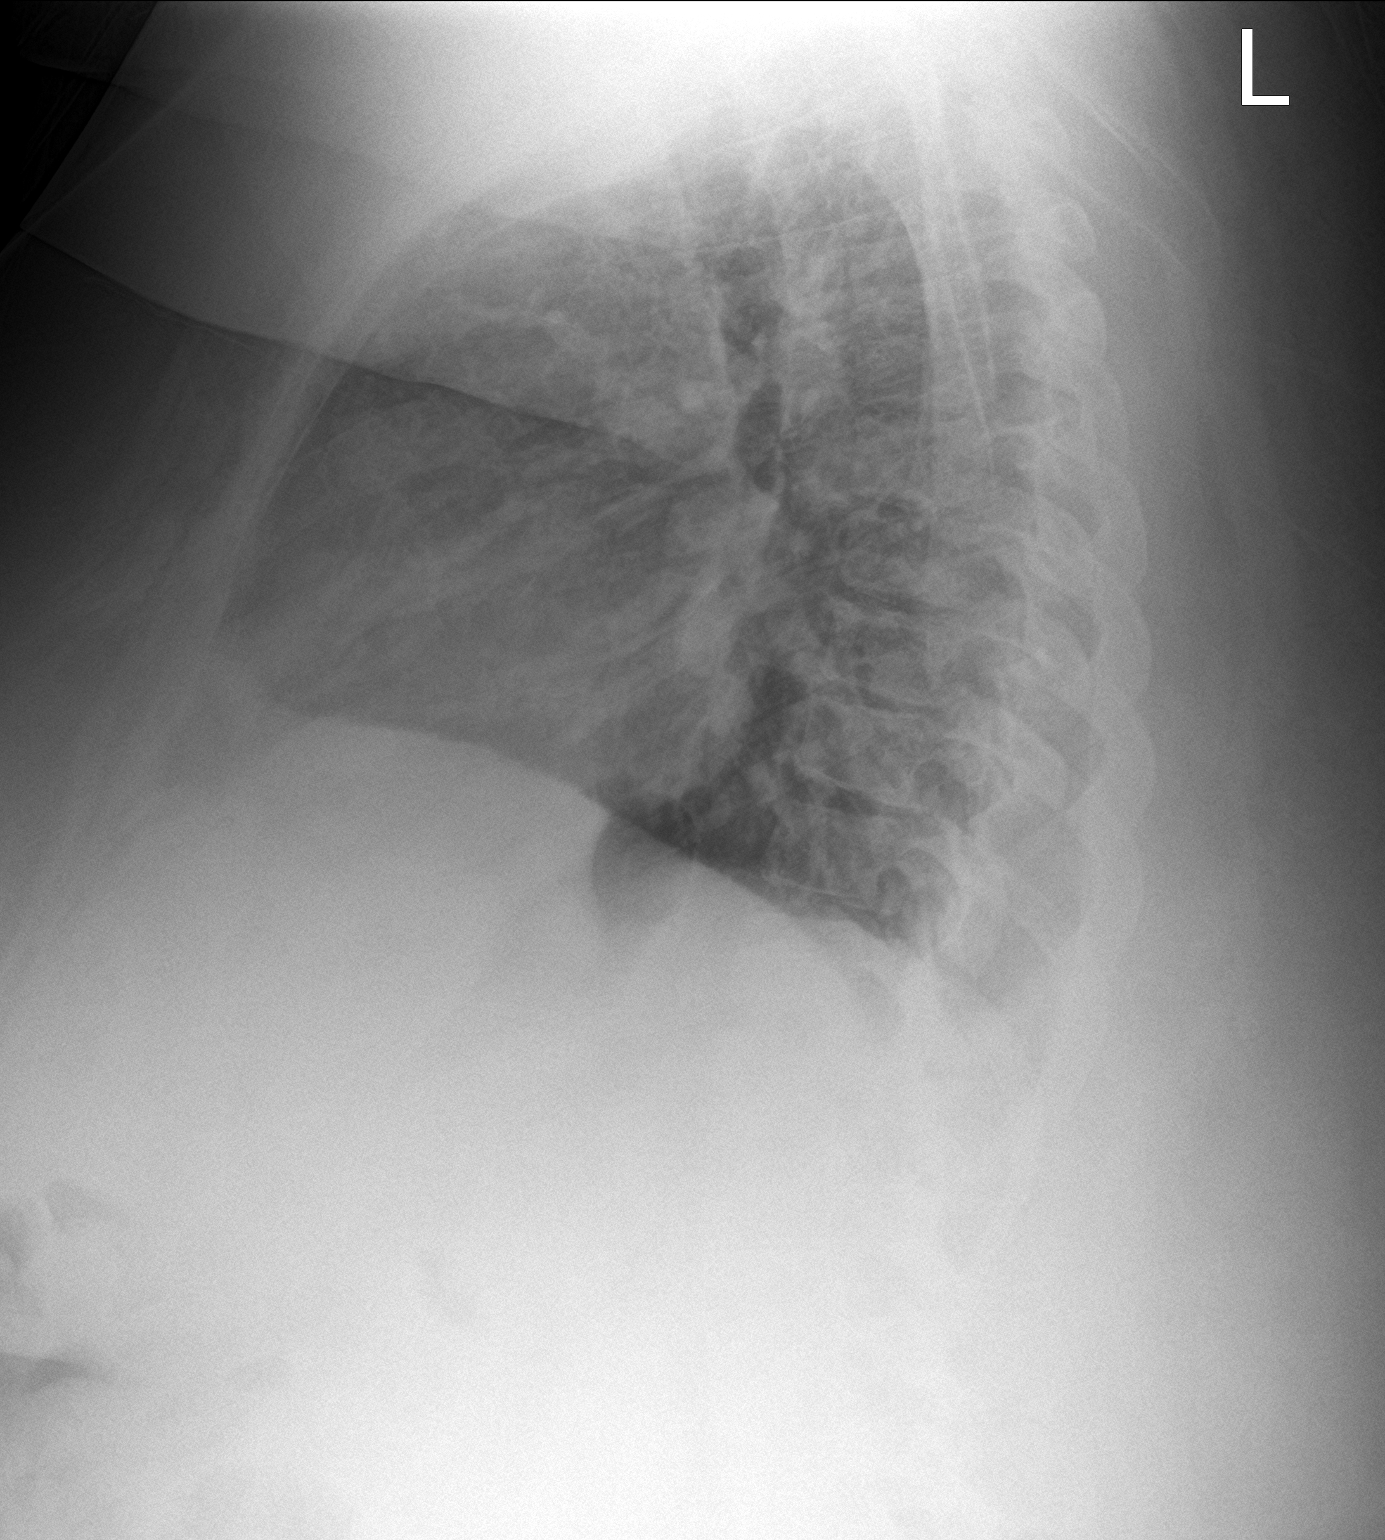

[2 of 2 positions shown; findings below may reference images not displayed]

FINDINGS: Enlargement of cardiac silhouette.

Respiratory motion artifacts degrade exam.

Improved pulmonary infiltrates with questionable mild persistent
infiltrate versus atelectasis at RIGHT base.

No gross pleural effusion or pneumothorax.

Osseous structures unremarkable.
IMPRESSION: Question mild persistent infiltrate versus atelectasis at RIGHT
base.

Overall significant improvement in pulmonary infiltrates since prior
exam.

## 2018-10-03 ENCOUNTER — Other Ambulatory Visit: Payer: Self-pay | Admitting: Family Medicine

## 2018-10-12 ENCOUNTER — Telehealth: Payer: Self-pay

## 2018-10-12 NOTE — Telephone Encounter (Signed)
Patient's mother, Octavio Graves, called. Would like to speak to PCP briefly about patient's oxygen.  Call back is [phone].  Ples Specter, RN [facility] RN)

## 2018-10-14 NOTE — Telephone Encounter (Signed)
Patient's mother called a second time.  Ples Specter, RN [facility] RN)

## 2018-10-14 NOTE — Telephone Encounter (Signed)
Text page sent to PCP. Bodhi Stenglein, RN ([facility] RN)  

## 2018-10-27 ENCOUNTER — Other Ambulatory Visit: Payer: Self-pay | Admitting: Family Medicine

## 2018-12-18 ENCOUNTER — Other Ambulatory Visit: Payer: Self-pay

## 2018-12-18 ENCOUNTER — Encounter: Payer: Self-pay | Admitting: Family Medicine

## 2018-12-18 ENCOUNTER — Ambulatory Visit (INDEPENDENT_AMBULATORY_CARE_PROVIDER_SITE_OTHER): Payer: Medicaid Other | Admitting: Family Medicine

## 2018-12-18 VITALS — BP 143/90 | HR 97 | Temp 98.2°F | Wt 286.2 lb

## 2018-12-18 DIAGNOSIS — Q8711 Prader-Willi syndrome: Secondary | ICD-10-CM

## 2018-12-18 DIAGNOSIS — E1169 Type 2 diabetes mellitus with other specified complication: Secondary | ICD-10-CM

## 2018-12-18 LAB — POCT GLYCOSYLATED HEMOGLOBIN (HGB A1C): HbA1c, POC (controlled diabetic range): 6 % (ref 0.0–7.0)

## 2018-12-18 NOTE — Progress Notes (Signed)
   Subjective:    Patient ID: [name], male    DOB: May 25, 1996, 23 y.o.   MRN: [id number]   CC: follow up for weight loss  HPI: Patient presents today with his mom after seeing his geneticist. The patient, who has Prader-Willi-Syndrome, has had a remarkable ~60lb weight loss in 60 months since his mother took over his diet. He is eating frequent meals and limiting caloric intake to 1,000-1,100 Calories daily. He has more energy and is exercising more. He is using his oxygen less and mom is slowly weaning him off of it. Mom presents for guidance on his medication dosages in the setting of his new weight loss. She denies the patient having any night sweats or coughing up blood. Mom also denies the patient having cough, shortness of breath, chest pain, abdominal pains, stool changes, joint pains, nausea, and vomiting.   The patient's new weight loss has caused him to develop sagging skin and large folds, especially under breast tissue and a large abdominal pannus.    Smoking status reviewed: non-smoker  Review of Systems: see HPI  Objective:  BP (!) 143/90   Pulse 97   Temp 98.2 F (36.8 C) (Oral)   Wt 286 lb 3.2 oz (129.8 kg)   SpO2 100%   BMI 57.81 kg/m  Vitals and nursing note reviewed  General: well nourished, in no acute distress, obese, pleasant male Neck: large neck circumference much smaller from previous visit, without LAD Cardiac: RRR, clear S1 and S2, no murmurs, rubs, or gallops Respiratory: CTA bilaterally, no increased work of breathing, satting well on 1L [place] Abdomen: soft, no masses, large abdominal pannus, +BS Extremities: no edema or cyanosis. Warm, well perfused. 2+ radial and PT pulses bilaterally Skin: warm and dry, no rashes noted, no evidence of skin breakdown or infection in abdominal skin folds Neuro: alert and oriented, no focal deficits  Assessment & Plan:   Prader-Willi syndrome and diabetes Patient has experienced ~66lb weight loss since November  2019 (350lb > 286.2lb) after making drastic diet change. Patient's HbA1c has also decreased from 6.9% > 6.0%. 1. Use Goldbond powder or equivalent in skin folds to prevent discomfort, fungal infections and skin breakdown. 2. Keep taking all of your meds as prescribed. 3. Ryzen looks awesome! Use your best judgement to wean off his oxygen as you see fit.  4. Ketotifen/Zaditor drops can be used once daily for itchy eyes. 5. Patient being referred for DEXA Scan (scheduled for August 2020) per geneticist's guidance.   Return in about 3 months (around 03/20/2019) for med follow up, weight check.  Dr. Peggyann Shoals [facility], PGY-1

## 2018-12-18 NOTE — Patient Instructions (Addendum)
Thank you for coming in to see [name] today! Please see below to review our plan for today's visit:  1. Use Goldbond powder or equivalent in skin folds to prevent discomfort, fungal infections and skin breakdown. 2. Keep taking all of your meds as prescribed. 3. [name] looks awesome! Use your best judgement to wean off his oxygen as you see fit.  4. Ketotifen/Zaditor drops can be used once daily for itchy eyes. 5. I am referring you for a DEXA Scan, which will assess for any decrease in bone density.   Please call the clinic at [phone] if your symptoms worsen or you have any concerns. It was our pleasure to serve you!  Dr. Peggyann Shoals [facility]

## 2018-12-22 NOTE — Assessment & Plan Note (Addendum)
Patient has experienced ~66lb weight loss since November 2019 (350lb > 286.2lb) after making drastic diet change. Patient's HbA1c has also decreased from 6.9% > 6.0%. 1. Use Goldbond powder or equivalent in skin folds to prevent discomfort, fungal infections and skin breakdown. 2. Keep taking all of your meds as prescribed. 3. [name] looks awesome! Use your best judgement to wean off his oxygen as you see fit.  4. Ketotifen/Zaditor drops can be used once daily for itchy eyes. 5. Patient being referred for DEXA Scan (scheduled for August 2020) per geneticist's guidance.

## 2019-01-28 ENCOUNTER — Ambulatory Visit (INDEPENDENT_AMBULATORY_CARE_PROVIDER_SITE_OTHER): Payer: Medicaid Other | Admitting: Emergency Medicine

## 2019-01-28 ENCOUNTER — Encounter: Payer: Self-pay | Admitting: Emergency Medicine

## 2019-01-28 ENCOUNTER — Other Ambulatory Visit: Payer: Self-pay

## 2019-01-28 ENCOUNTER — Ambulatory Visit: Payer: Medicaid Other | Admitting: Emergency Medicine

## 2019-01-28 DIAGNOSIS — E662 Morbid (severe) obesity with alveolar hypoventilation: Secondary | ICD-10-CM | POA: Diagnosis not present

## 2019-01-28 DIAGNOSIS — J45909 Unspecified asthma, uncomplicated: Secondary | ICD-10-CM

## 2019-01-28 NOTE — Patient Instructions (Signed)
Congratulations on changing your diet and your weight loss!  Keep up the good work. Agree with wearing oxygen at 2 L/min when you are walking and exerting. It should be okay for you to go back to water aerobics when the [employer] opens as long as your oxygen saturations stay greater than 88% Continue to use the Ttrilogy ventilator every night.  You have great compliance Follow with Dr Byrum in 6 months or sooner if you have any problems 

## 2019-01-28 NOTE — Assessment & Plan Note (Signed)
70 lbs wt loss since last time!! Modified, monitored diet by his caregivers.

## 2019-01-28 NOTE — Assessment & Plan Note (Signed)
Continue Flovent.

## 2019-01-28 NOTE — Progress Notes (Signed)
Subjective:    Patient ID: [name], male    DOB: [birth year], 23 y.o.   MRN: [id number]   Asthma He complains of shortness of breath and wheezing. There is no cough. Associated symptoms include postnasal drip. Pertinent negatives include no ear pain, fever, headaches, rhinorrhea, sneezing, sore throat or trouble swallowing. His past medical history is significant for asthma.   ROV 07/22/18 --[name] is a very pleasant 23 year old man with Prader-Willi syndrome, associated progressive obesity.  He is followed here for severe OSA/OHS and possible obstructive lung disease.  He has been managed on a trilogy ventilator, oxygen at 2 L/min during the day.  He was unfortunately admitted back in late September for pneumonia.  He returns today with his friend and caretaker [name].  They report that he is doing well, great compliance. He appears to breathe comfortably during the day. He can sometimes desaturate with ambulation, quickly recovers, on 2L/min. He has lost 8 lbs since 1 month ago. He remains on singulair, flovent. He uses albuterol as needed, probably daily. No flares, no hospitalization. He has had the flu shot. Wants to get back to the pool, water exercise.   ROV 01/28/2019 --very pleasant 23 year old gentleman with Prader-Willi syndrome and associated obesity.  I follow him for severe OSA/OHS.  He also has possible obstructive lung disease, on Flovent.  He has been managed on a trilogy ventilator, uses oxygen at 2 L/min during the day with exertion.  He likes to do water aerobics and is difficult to do this with oxygen in place.  [facility] [employer] [facility] right now. His activity level is a bit better. He has lost 70 lbs!!  He is still wearing Trilogy, gets support from [facility]. Great compliance.     Review of Systems  Constitutional: Negative for fever and unexpected weight change.  HENT: Positive for congestion, postnasal drip and sinus pressure. Negative for dental problem, ear pain, nosebleeds, rhinorrhea,  sneezing, sore throat and trouble swallowing.   Eyes: Negative for redness and itching.  Respiratory: Positive for shortness of breath and wheezing. Negative for cough and chest tightness.   Cardiovascular: Negative for palpitations and leg swelling.  Gastrointestinal: Negative for nausea and vomiting.  Genitourinary: Negative for dysuria.  Musculoskeletal: Negative for joint swelling.  Skin: Negative for rash.  Neurological: Negative for headaches.  Hematological: Does not bruise/bleed easily.  Psychiatric/Behavioral: Negative for dysphoric mood. The patient is not nervous/anxious.        Objective:   Physical Exam Vitals:   01/28/19 1025  BP: 122/84  Pulse: 100  SpO2: 99%  Weight: 280 lb (127 kg)  Height: 4\' 11"  (1.499 m)   Gen: Pleasant, morbidly obese, in no distress, Interacting and answering questions. Comfortable on RA  ENT: No lesions,  mouth clear,  oropharynx clear, no active  postnasal drip  Neck: No JVD, no stridor  Lungs: distant but more clear today, better breath sounds.    Cardiovascular: RRR, heart sounds normal, no murmur or gallops, no peripheral edema  Musculoskeletal: No deformities, no cyanosis or clubbing  Neuro: alert, answers simple questions, moves all ext, faces consistent w his hx P-W syndrome.   Skin: Warm, no lesions or rashes     Assessment & Plan:  Obesity hypoventilation syndrome ([name]) Congratulations on changing your diet and your weight loss!  Keep up the good work. Agree with wearing oxygen at 2 L/min when you are walking and exerting. It should be okay for you to go back to water aerobics when  the [employer] opens as long as your oxygen saturations stay greater than 88% Continue to use the Ttrilogy ventilator every night.  You have great compliance Follow with Dr Delton CoombesByrum in 6 months or sooner if you have any problems  Asthma Continue Flovent  Obesity, morbid (HCC) 70 lbs wt loss since last time!! Modified, monitored diet by his  caregivers.   Levy Pupaobert Jeliyah Middlebrooks, MD, PhD 01/28/2019, 10:52 AM [facility] [phone] or if no answer [phone]

## 2019-01-28 NOTE — Assessment & Plan Note (Signed)
Congratulations on changing your diet and your weight loss!  Keep up the good work. Agree with wearing oxygen at 2 L/min when you are walking and exerting. It should be okay for you to go back to water aerobics when the [employer] opens as long as your oxygen saturations stay greater than 88% Continue to use the Ttrilogy ventilator every night.  You have great compliance Follow with Dr Lamonte Sakai in 6 months or sooner if you have any problems

## 2019-01-29 ENCOUNTER — Other Ambulatory Visit: Payer: Self-pay | Admitting: *Deleted

## 2019-01-29 MED ORDER — ACCU-CHEK FASTCLIX LANCETS MISC: 6 refills | Status: DC

## 2019-01-29 MED ORDER — TORSEMIDE 20 MG PO TABS: 40.0000 mg | ORAL_TABLET | Freq: Two times a day (BID) | ORAL | 3 refills | Status: DC

## 2019-02-03 ENCOUNTER — Other Ambulatory Visit: Payer: Self-pay | Admitting: Family Medicine

## 2019-03-02 ENCOUNTER — Other Ambulatory Visit: Payer: Self-pay | Admitting: *Deleted

## 2019-03-02 MED ORDER — METFORMIN HCL ER 500 MG PO TB24: 500.0000 mg | ORAL_TABLET | Freq: Every day | ORAL | 3 refills | Status: DC

## 2019-03-05 ENCOUNTER — Other Ambulatory Visit: Payer: Self-pay | Admitting: Family Medicine

## 2019-03-05 MED ORDER — METFORMIN HCL ER 500 MG PO TB24: 1000.0000 mg | ORAL_TABLET | Freq: Every day | ORAL | 3 refills | Status: DC

## 2019-03-11 ENCOUNTER — Other Ambulatory Visit: Payer: Self-pay | Admitting: Family Medicine

## 2019-03-11 DIAGNOSIS — Q8711 Prader-Willi syndrome: Secondary | ICD-10-CM

## 2019-03-11 DIAGNOSIS — E1169 Type 2 diabetes mellitus with other specified complication: Secondary | ICD-10-CM

## 2019-03-15 ENCOUNTER — Other Ambulatory Visit: Payer: Self-pay

## 2019-03-15 ENCOUNTER — Ambulatory Visit
Admission: RE | Admit: 2019-03-15 | Discharge: 2019-03-15 | Disposition: A | Discharge status: Home / Self Care | Payer: Medicaid Other | Source: Ambulatory Visit | Attending: Family Medicine | Admitting: Family Medicine

## 2019-03-15 DIAGNOSIS — E1169 Type 2 diabetes mellitus with other specified complication: Secondary | ICD-10-CM

## 2019-04-03 ENCOUNTER — Other Ambulatory Visit: Payer: Self-pay | Admitting: Family Medicine

## 2019-05-14 IMAGING — DX DG CHEST 2V
2 series · 2 of 2 positions shown · non-contrast
Comparison: 05/01/2018

CLINICAL DATA: Follow-up pneumonia

EXAM:
CHEST - 2 VIEW

[chest pa]
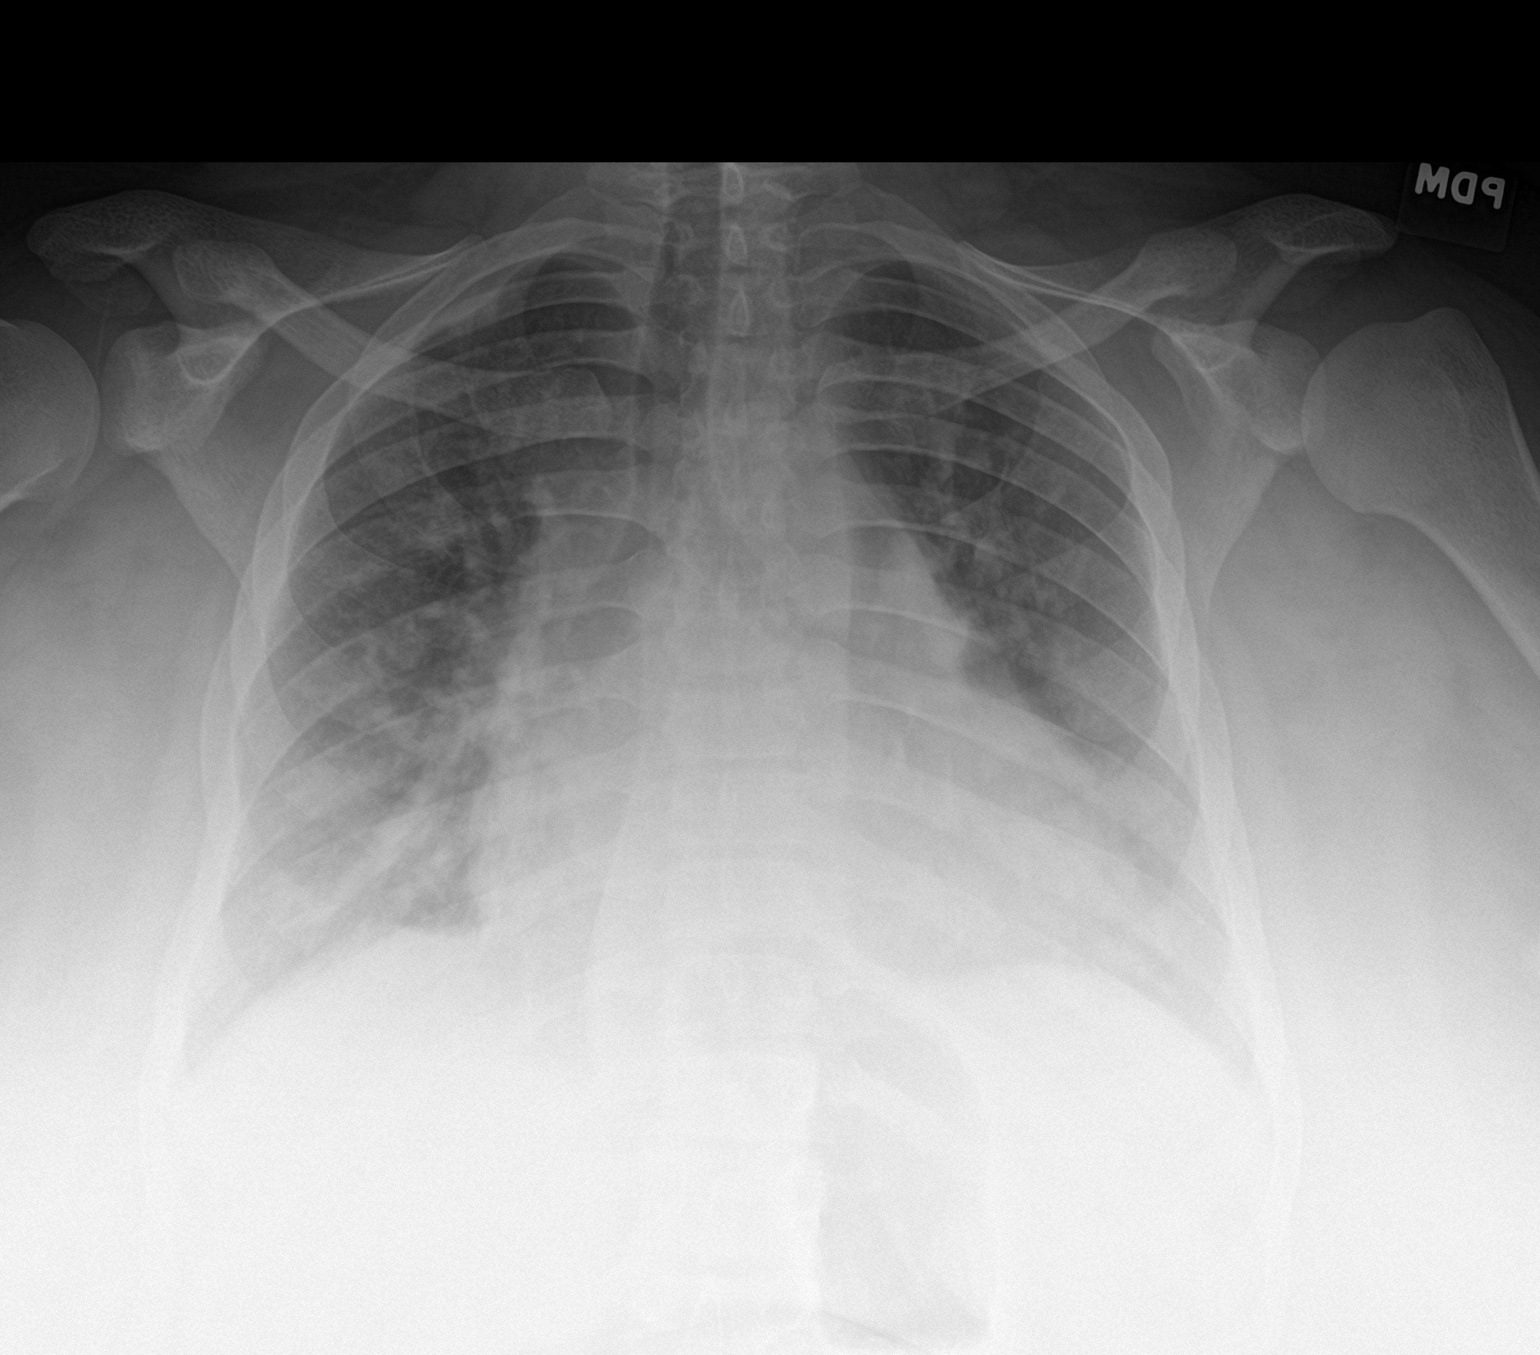

[chest lat]
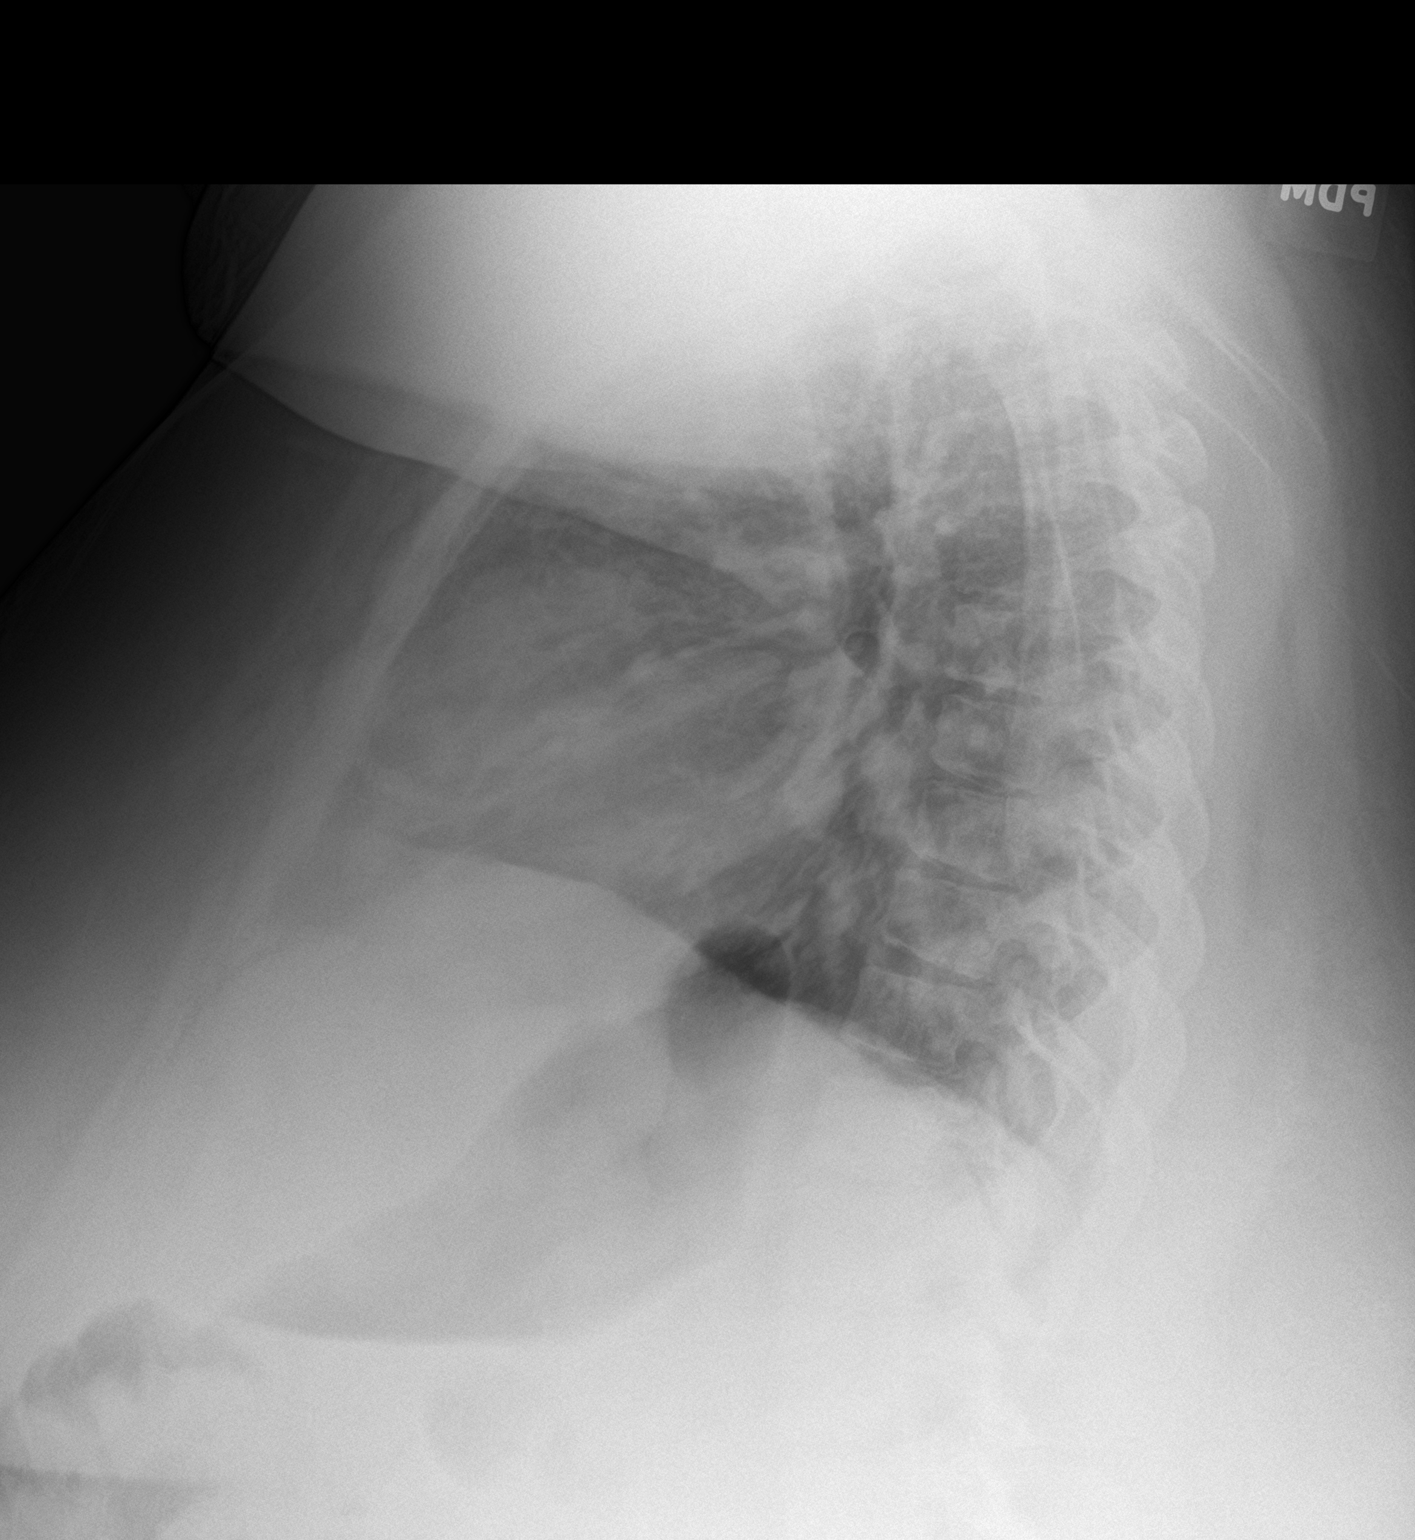

[2 of 2 positions shown; findings below may reference images not displayed]

FINDINGS: The heart remains enlarged. Patchy airspace opacities throughout
both lungs have improved. Lungs remain under aerated. No
pneumothorax. No pleural effusion.
IMPRESSION: Improving bilateral airspace opacities.

## 2019-05-26 ENCOUNTER — Other Ambulatory Visit: Payer: Self-pay

## 2019-05-26 ENCOUNTER — Ambulatory Visit (INDEPENDENT_AMBULATORY_CARE_PROVIDER_SITE_OTHER): Payer: Medicaid Other | Admitting: *Deleted

## 2019-05-26 DIAGNOSIS — Z23 Encounter for immunization: Secondary | ICD-10-CM

## 2019-05-26 NOTE — Progress Notes (Signed)
Pt tolerated vaccine well. Deseree Blount, CMA  

## 2019-06-03 ENCOUNTER — Other Ambulatory Visit: Payer: Self-pay

## 2019-06-05 MED ORDER — TORSEMIDE 20 MG PO TABS: 40.0000 mg | ORAL_TABLET | Freq: Two times a day (BID) | ORAL | 3 refills | Status: DC

## 2019-06-23 ENCOUNTER — Other Ambulatory Visit: Payer: Self-pay | Admitting: Family Medicine

## 2019-07-18 ENCOUNTER — Other Ambulatory Visit: Payer: Self-pay | Admitting: Family Medicine

## 2019-07-18 DIAGNOSIS — I10 Essential (primary) hypertension: Secondary | ICD-10-CM

## 2019-08-02 ENCOUNTER — Other Ambulatory Visit: Payer: Self-pay | Admitting: Family Medicine

## 2019-09-16 ENCOUNTER — Other Ambulatory Visit: Payer: Self-pay | Admitting: *Deleted

## 2019-09-16 MED ORDER — TORSEMIDE 20 MG PO TABS: 40.0000 mg | ORAL_TABLET | Freq: Two times a day (BID) | ORAL | 3 refills | Status: DC

## 2019-09-16 MED ORDER — FLOVENT HFA 110 MCG/ACT IN AERO: 2.0000 | INHALATION_SPRAY | Freq: Two times a day (BID) | RESPIRATORY_TRACT | 2 refills | Status: DC

## 2019-10-04 NOTE — Progress Notes (Signed)
    SUBJECTIVE:   CHIEF COMPLAINT / HPI:   Weight mgmt: down 110lbs from October 2019! Change is primarily due to drastic diet changes as controlled by his mom. He is moving and breathing better, denies severe shortness. He has been walking every day and playing "Just Dance" to two songs daily to exercise. He was able to walk into the clinic today from parked car without difficulty. Now has extra skin, is interested in getting more information about skin removal. Denies chest pain, heart palpitations, lightheadedness, abdominal pain, nausea, vomiting, decreased appetite.  Reports discomfort from the extra skin.  Ventilator renewal: he is currently using Trilogy with 7L oxygen AT NIGHT ONLY. He is not currently requiring any supplementation during the day.  Denies headaches, chest pain, nausea, vomiting, abdominal pain, and pain in his extremities.  PERTINENT  PMH / PSH:  Prader-Willi-Syndrome Hx Gout Obesity BMI 49 Excess Skin Weight loss, intentional Raynaud's OHS  OBJECTIVE:   BP 122/70   Pulse (!) 107   Wt 243 lb (110.2 kg)   SpO2 100%   BMI 49.08 kg/m    Physical exam:  General: No apparent distress, pleasant patient, nontoxic appearing Cardiac: RRR, S1-S2 present, no murmurs appreciated, 2+ radial pulses appreciated bilaterally Resp: CTA bilaterally, moving air well, slightly difficult exam secondary to body habitus Abd: Soft, nontender, large "deflated appearing" pannus appreciated Skin: No abrasions, lacerations, or significant findings concerning for infection appreciated on skin exam; extra skin appreciated to patient's chest/breast, abdomen, triceps, back, and thighs.  (See photos below)           ASSESSMENT/PLAN:   Obesity hypoventilation syndrome (HCC) Patient has history of obesity hypoventilation syndrome.  In clinic today patient has lost 110 pounds from visit about 1 year prior.  Is now no longer requiring oxygen during the day and with ambulation.   Continues on trilogy 7 L oxygen while sleeping. -Continue trilogy 7 L at night -Maintain O2 saturation > 92%  Type 2 diabetes mellitus with complication, without long-term current use of insulin (HCC) HbA1c down from 6.0% to 5.7%. Patient taking Metformin XR 1000mg  daily. - reduce to Metformin XR 500mg  daily - Continue dietary and exercise measures to reduce weight and improve diabetes  Excess skin Patient with excess skin 2/2 to intentional weight loss. Would like to be evaluated for skin removal surgery. - Referral made to Plastics for evaluation     , DO [facility]

## 2019-10-05 ENCOUNTER — Encounter: Payer: Self-pay | Admitting: Family Medicine

## 2019-10-05 ENCOUNTER — Other Ambulatory Visit: Payer: Self-pay

## 2019-10-05 ENCOUNTER — Ambulatory Visit (INDEPENDENT_AMBULATORY_CARE_PROVIDER_SITE_OTHER): Payer: Medicaid Other | Admitting: Family Medicine

## 2019-10-05 VITALS — BP 122/70 | HR 107 | Wt 243.0 lb

## 2019-10-05 DIAGNOSIS — E118 Type 2 diabetes mellitus with unspecified complications: Secondary | ICD-10-CM | POA: Diagnosis not present

## 2019-10-05 DIAGNOSIS — Q8711 Prader-Willi syndrome: Secondary | ICD-10-CM | POA: Diagnosis not present

## 2019-10-05 DIAGNOSIS — E662 Morbid (severe) obesity with alveolar hypoventilation: Secondary | ICD-10-CM | POA: Diagnosis present

## 2019-10-05 DIAGNOSIS — E1169 Type 2 diabetes mellitus with other specified complication: Secondary | ICD-10-CM

## 2019-10-05 DIAGNOSIS — L987 Excessive and redundant skin and subcutaneous tissue: Secondary | ICD-10-CM

## 2019-10-05 DIAGNOSIS — R634 Abnormal weight loss: Secondary | ICD-10-CM | POA: Diagnosis not present

## 2019-10-05 LAB — POCT GLYCOSYLATED HEMOGLOBIN (HGB A1C): HbA1c, POC (controlled diabetic range): 5.7 % (ref 0.0–7.0)

## 2019-10-05 NOTE — Patient Instructions (Addendum)
Thank you for coming in to see [name] today! Please see below to review our plan for today's visit:  1. Please come back to see [name] in 3 months for repeat HbA1c. At this appt we can also discuss possibly stopping some medications.  2. Only take 1 tablet of Metformin 500mg  every morning (not two)! 3. HE LOOKS AWESOME!!!! 4. I will contact you with information about referral for skin removal surgery.  Please call the clinic at [phone] if your symptoms worsen or you have any concerns. It was our pleasure to serve you!  Dr. [phone] [facility]

## 2019-10-05 NOTE — Assessment & Plan Note (Addendum)
Patient has history of obesity hypoventilation syndrome.  In clinic today patient has lost 110 pounds from visit about 1 year prior.  Is now no longer requiring oxygen during the day and with ambulation.  Continues on trilogy 7 L oxygen while sleeping. -Continue trilogy 7 L at night -Maintain O2 saturation > 92%

## 2019-10-11 DIAGNOSIS — L987 Excessive and redundant skin and subcutaneous tissue: Secondary | ICD-10-CM | POA: Insufficient documentation

## 2019-10-11 DIAGNOSIS — R634 Abnormal weight loss: Secondary | ICD-10-CM | POA: Insufficient documentation

## 2019-10-11 NOTE — Assessment & Plan Note (Signed)
Patient with excess skin 2/2 to intentional weight loss. Would like to be evaluated for skin removal surgery. - Referral made to Plastics for evaluation

## 2019-10-11 NOTE — Assessment & Plan Note (Signed)
HbA1c down from 6.0% to 5.7%. Patient taking Metformin XR 1000mg  daily. - reduce to Metformin XR 500mg  daily - Continue dietary and exercise measures to reduce weight and improve diabetes

## 2019-10-28 ENCOUNTER — Encounter: Payer: Self-pay | Admitting: Plastic Surgery

## 2019-10-28 ENCOUNTER — Ambulatory Visit (INDEPENDENT_AMBULATORY_CARE_PROVIDER_SITE_OTHER): Payer: Medicaid Other | Admitting: Plastic Surgery

## 2019-10-28 ENCOUNTER — Other Ambulatory Visit: Payer: Self-pay

## 2019-10-28 VITALS — BP 140/90 | HR 62 | Temp 98.6°F | Ht <= 58 in | Wt 237.0 lb

## 2019-10-28 DIAGNOSIS — M793 Panniculitis, unspecified: Secondary | ICD-10-CM

## 2019-10-28 NOTE — Progress Notes (Signed)
Referring Provider Milus Banister C, DO [address],  [place] [address]   CC:  Chief Complaint  Patient presents with  . Advice Only    for excess skin      [name] is an 24 y.o. male.  HPI: Patient presents to discuss excess skin after losing over 100 pounds.  He has Prader-Willi syndrome and got up as high as 325 or more pounds.  He is now down closer to 235.  He has a significant amount of excess skin in his abdomen arms and chest.  He is here with his mom who is his guardian who wants to see if anything can be done.  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  They have not able to be controlled with creams and powders.  Patient does continue to lose weight and has lost 7 to 10 pounds in the past month.  No Known Allergies  Outpatient Encounter Medications as of 10/28/2019  Medication Sig  . Accu-Chek FastClix Lancets MISC TEST BLOOD SUGAR DAILY  . ACCU-CHEK GUIDE test strip TEST BLOOD SUGAR DAILY  . allopurinol (ZYLOPRIM) 300 MG tablet TAKE 1 TABLET BY MOUTH DAILY  . amLODipine (NORVASC) 10 MG tablet TAKE 1 TABLET BY MOUTH DAILY  . Blood Glucose Monitoring Suppl (ACCU-CHEK AVIVA PLUS) w/Device KIT Use to check CBG daily  . calcium carbonate (TUMS - DOSED IN MG ELEMENTAL CALCIUM) 500 MG chewable tablet Chew 2 tablets (400 mg of elemental calcium total) by mouth 3 (three) times daily between meals as needed for indigestion or heartburn.  . cetirizine (ZYRTEC) 10 MG tablet TAKE 1 TABLET BY MOUTH DAILY  . desmopressin (DDAVP) 0.2 MG tablet TAKE 2 TABLETS BY MOUTH AT BEDTIME  . fluticasone (FLONASE) 50 MCG/ACT nasal spray Place 1 spray into both nostrils daily.  . fluticasone (FLOVENT HFA) 110 MCG/ACT inhaler Inhale 2 puffs into the lungs 2 (two) times daily.  . Lancets 30G MISC 1 Device by Does not apply route daily  before breakfast.  . metFORMIN (GLUCOPHAGE-XR) 500 MG 24 hr tablet Take 2 tablets (1,000 mg total) by mouth daily with breakfast.  . montelukast (SINGULAIR) 10 MG tablet TAKE 1 TABLET BY MOUTH AT BEDTIME  . oxybutynin (DITROPAN) 5 MG tablet Take 2 tablets (10 mg total) by mouth at bedtime.  . pantoprazole (PROTONIX) 20 MG tablet TAKE 1 TABLET BY MOUTH DAILY  . PREVIDENT 5000 ENAMEL PROTECT 1.1-5 % PSTE Take 1 application by mouth 2 (two) times daily.   Marland Kitchen Respiratory Therapy Supplies (NEBULIZER/ADULT MASK) KIT 1 Device by Does not apply route once.  Marland Kitchen Respiratory Therapy Supplies MISC BiPAP IPAP:20, EPAP: 8, back up respiratory rate: 10, FIO2: 35%.  Marland Kitchen Spacer/Aero-Holding Chambers (AEROCHAMBER MV) inhaler Use with albuterol inhaler  . torsemide (DEMADEX) 20 MG tablet Take 2 tablets (40 mg total) by mouth 2 (two) times daily.  . [DISCONTINUED] pantoprazole (PROTONIX) 20 MG tablet TAKE 1 TABLET(20 MG) BY MOUTH DAILY   No facility-administered encounter medications on file as of 10/28/2019.     Past Medical History:  Diagnosis Date  . Asthma   . Diabetes mellitus without complication ([place])   . Hypertension    Pulmomary htn  . Kidney dysfunction    left  . Obesity   . Obesity hypoventilation syndrome ([place])   . Prader-Willi syndrome   .  Raynaud's phenomenon   . Sleep apnea     Past Surgical History:  Procedure Laterality Date  . NISSEN FUNDOPLICATION    . TONSILLECTOMY      Family History  Problem Relation Age of Onset  . Migraines Mother     Social History   Social History Narrative   Lives with mom, very supportive, Doroteo Bradford and brother.  HS education.  Has Prader Willi syndrome.      Review of Systems General: Denies fevers, chills, weight loss CV: Denies chest pain, shortness of breath, palpitations  Physical Exam Vitals with BMI 10/28/2019 10/05/2019 01/28/2019  Height '4\' 6"'  - '4\' 11"'   Weight 237 lbs 243 lbs 280 lbs  BMI 20.72 - 18.28  Systolic [phone]  Diastolic 90  70 84  Pulse 62 107 100    General:  No acute distress,  Alert and oriented, Non-Toxic, Normal speech and affect Abdomen: Significant excess skin and adipose tissue.  I do not appreciate any hernias.  He has signs of irritation in the crease beneath the pannus.  He has an additional area of overhanging skin in the left upper abdomen with the tethering in the crease below it corresponding with his Nissen fundoplication scar. Chest: He has significant excess skin and adipose tissue in the chest Arms: He has significant excess skin and adipose tissue in the upper arms.  His hands are neurovascularly intact.  Assessment/Plan Patient presents with multiple contour irregularities as a result of weight loss.  He is continuing to lose weight.  I think he will be a good candidate for panniculectomy.  I explained to the patient and his mom that he should be at a stable weight for at least 3 to 6 months before undergoing body contouring or panniculectomy surgery.  They are to continue their current diet regimen until he gets to a stable weight and then call for appointment at that point.  I talked about the procedure in detail and answered all her questions.  Cindra Presume 10/28/2019, 8:23 PM

## 2019-11-12 ENCOUNTER — Other Ambulatory Visit: Payer: Self-pay

## 2019-11-13 MED ORDER — ALLOPURINOL 300 MG PO TABS: 300.0000 mg | ORAL_TABLET | Freq: Every day | ORAL | 2 refills | Status: DC

## 2019-12-12 ENCOUNTER — Other Ambulatory Visit: Payer: Self-pay | Admitting: Family Medicine

## 2019-12-29 ENCOUNTER — Other Ambulatory Visit: Payer: Self-pay | Admitting: *Deleted

## 2019-12-29 MED ORDER — TORSEMIDE 20 MG PO TABS: 40.0000 mg | ORAL_TABLET | Freq: Two times a day (BID) | ORAL | 6 refills | Status: DC

## 2019-12-30 ENCOUNTER — Encounter: Payer: Self-pay | Admitting: Emergency Medicine

## 2019-12-30 ENCOUNTER — Ambulatory Visit: Payer: Medicaid Other | Admitting: Emergency Medicine

## 2019-12-30 ENCOUNTER — Other Ambulatory Visit: Payer: Self-pay

## 2019-12-30 DIAGNOSIS — E662 Morbid (severe) obesity with alveolar hypoventilation: Secondary | ICD-10-CM | POA: Diagnosis not present

## 2019-12-30 DIAGNOSIS — J45909 Unspecified asthma, uncomplicated: Secondary | ICD-10-CM | POA: Diagnosis not present

## 2019-12-30 NOTE — Assessment & Plan Note (Signed)
Continue Flovent as ordered.  Albuterol as needed.

## 2019-12-30 NOTE — Assessment & Plan Note (Signed)
Well compensated.  He has lost some weight.  Remains on trilogy ventilator at night with oxygen bled in.  He is no longer using oxygen with exertion.  Working at [employer] on a regular basis now that it has reopened.  His Covid vaccine is up-to-date, Moderna.

## 2019-12-30 NOTE — Progress Notes (Signed)
Subjective:    Patient ID: THERESA DOHRMAN, male    DOB: 12/09/1995, 24 y.o.   MRN: [id number]   Asthma He complains of shortness of breath and wheezing. There is no cough. Associated symptoms include postnasal drip. Pertinent negatives include no ear pain, fever, headaches, rhinorrhea, sneezing, sore throat or trouble swallowing. His past medical history is significant for asthma.   ROV 07/22/18 --TJ is a very pleasant 24 year old man with Prader-Willi syndrome, associated progressive obesity.  He is followed here for severe OSA/OHS and possible obstructive lung disease.  He has been managed on a trilogy ventilator, oxygen at 2 L/min during the day.  He was unfortunately admitted back in late September for pneumonia.  He returns today with his friend and caretaker CJ.  They report that he is doing well, great compliance. He appears to breathe comfortably during the day. He can sometimes desaturate with ambulation, quickly recovers, on 2L/min. He has lost 8 lbs since 1 month ago. He remains on singulair, flovent. He uses albuterol as needed, probably daily. No flares, no hospitalization. He has had the flu shot. Wants to get back to the pool, water exercise.   ROV 01/28/2019 --very pleasant 24 year old gentleman with Prader-Willi syndrome and associated obesity.  I follow him for severe OSA/OHS.  He also has possible obstructive lung disease, on Flovent.  He has been managed on a trilogy ventilator, uses oxygen at 2 L/min during the day with exertion.  He likes to do water aerobics and is difficult to do this with oxygen in place.  [facility] [employer] is closed right now. His activity level is a bit better. He has lost 70 lbs!!  He is still wearing Trilogy, gets support from [facility]. Great compliance.   ROV 12/30/19 --follow-up visit for 24 year old gentleman with Prader-Willi syndrome, associated obesity, severe OSA OHS which we have treated with a trilogy ventilator.  No longer needs O2 w exertion.   We speculate that he  may have some obstructive lung disease and has been managed with Flovent. No albuterol use. COVID shot up to date - Moderna. He is now back to going to the [facility]. No trouble with his trilogy, gets his equipment reliably. Need to renew his O2 and trilogy to [employer] > per mom.    Review of Systems  Constitutional: Negative for fever and unexpected weight change.  HENT: Positive for congestion, postnasal drip and sinus pressure. Negative for dental problem, ear pain, nosebleeds, rhinorrhea, sneezing, sore throat and trouble swallowing.   Eyes: Negative for redness and itching.  Respiratory: Positive for shortness of breath and wheezing. Negative for cough and chest tightness.   Cardiovascular: Negative for palpitations and leg swelling.  Gastrointestinal: Negative for nausea and vomiting.  Genitourinary: Negative for dysuria.  Musculoskeletal: Negative for joint swelling.  Skin: Negative for rash.  Neurological: Negative for headaches.  Hematological: Does not bruise/bleed easily.  Psychiatric/Behavioral: Negative for dysphoric mood. The patient is not nervous/anxious.        Objective:   Physical Exam Vitals:   12/30/19 1040  BP: 134/70  Pulse: 98  Temp: 98.9 F (37.2 C)  TempSrc: Oral  SpO2: 100%  Weight: 246 lb (111.6 kg)   Gen: Pleasant, morbidly obese, in no distress, Interacting and answering questions. Comfortable on RA  ENT: No lesions,  mouth clear,  oropharynx clear, no active  postnasal drip  Neck: No JVD, no stridor  Lungs: clear bilaterally, no wheeze  Cardiovascular: RRR, heart sounds normal, no murmur or gallops, no peripheral  edema  Musculoskeletal: No deformities, no cyanosis or clubbing  Neuro: alert, answers simple questions, moves all ext, faces consistent w his hx P-W syndrome.   Skin: Warm, no lesions or rashes     Assessment & Plan:  Obesity hypoventilation syndrome ([place]) Well compensated.  He has lost some weight.  Remains on trilogy ventilator at  night with oxygen bled in.  He is no longer using oxygen with exertion.  Working at [employer] on a regular basis now that it has reopened.  His Covid vaccine is up-to-date, Moderna.  Asthma Continue Flovent as ordered.  Albuterol as needed.  Baltazar Apo, MD, PhD 12/30/2019, 11:11 AM [facility] [phone] or if no answer [phone]

## 2019-12-30 NOTE — Progress Notes (Signed)
  Subjective:    Patient ID: [name], male    DOB: 1996/07/29, 24 y.o.   MRN: [id number]   Asthma He complains of shortness of breath and wheezing. There is no cough. Associated symptoms include postnasal drip. Pertinent negatives include no ear pain, fever, headaches, rhinorrhea, sneezing, sore throat or trouble swallowing. His past medical history is significant for asthma.   ROV 07/22/18 --TJ is a very pleasant 24 year old man with Prader-Willi syndrome, associated progressive obesity.  He is followed here for severe OSA/OHS and possible obstructive lung disease.  He has been managed on a trilogy ventilator, oxygen at 2 L/min during the day.  He was unfortunately admitted back in late September for pneumonia.  He returns today with his friend and caretaker CJ.  They report that he is doing well, great compliance. He appears to breathe comfortably during the day. He can sometimes desaturate with ambulation, quickly recovers, on 2L/min. He has lost 8 lbs since 1 month ago. He remains on singulair, flovent. He uses albuterol as needed, probably daily. No flares, no hospitalization. He has had the flu shot. Wants to get back to the pool, water exercise.   ROV 01/28/2019 --very pleasant 24 year old gentleman with Prader-Willi syndrome and associated obesity.  I follow him for severe OSA/OHS.  He also has possible obstructive lung disease, on Flovent.  He has been managed on a trilogy ventilator, uses oxygen at 2 L/min during the day with exertion.  He likes to do water aerobics and is difficult to do this with oxygen in place.  [facility] [employer] [facility] right now. His activity level is a bit better. He has lost 70 lbs!!  He is still wearing Trilogy, gets support from [facility]. Great compliance.       Review of Systems  Constitutional: Negative for fever and unexpected weight change.  HENT: Positive for congestion, postnasal drip and sinus pressure. Negative for dental problem, ear pain, nosebleeds,  rhinorrhea, sneezing, sore throat and trouble swallowing.   Eyes: Negative for redness and itching.  Respiratory: Positive for shortness of breath and wheezing. Negative for cough and chest tightness.   Cardiovascular: Negative for palpitations and leg swelling.  Gastrointestinal: Negative for nausea and vomiting.  Genitourinary: Negative for dysuria.  Musculoskeletal: Negative for joint swelling.  Skin: Negative for rash.  Neurological: Negative for headaches.  Hematological: Does not bruise/bleed easily.  Psychiatric/Behavioral: Negative for dysphoric mood. The patient is not nervous/anxious.        Objective:   Physical Exam Vitals:   12/30/19 1040  BP: 134/70  Pulse: 98  Temp: 98.9 F (37.2 C)  TempSrc: Oral  SpO2: 100%  Weight: 246 lb (111.6 kg)   Gen: Pleasant, morbidly obese, in no distress, Interacting and answering questions. Comfortable on RA  ENT: No lesions,  mouth clear,  oropharynx clear, no active  postnasal drip  Neck: No JVD, no stridor  Lungs: distant but more clear today, better breath sounds.    Cardiovascular: RRR, heart sounds normal, no murmur or gallops, no peripheral edema  Musculoskeletal: No deformities, no cyanosis or clubbing  Neuro: alert, answers simple questions, moves all ext, faces consistent w his hx P-W syndrome.   Skin: Warm, no lesions or rashes     Assessment & Plan:  No problem-specific Assessment & Plan notes found for this encounter.  Levy Pupa, MD, PhD 12/30/2019, 11:03 AM [facility] [phone] or if no answer [phone]

## 2019-12-30 NOTE — Patient Instructions (Signed)
Please continue to wear your trilogy ventilator every night with oxygen bled in.  We will renew your orders through [facility]. Continue Flovent 2 puffs twice a day.  Rinse and gargle after using. Keep albuterol available to use 2 puffs if needed for shortness of breath, chest tightness, wheezing. Keep up the good work with your exercise routine.  Slow steady weight loss is definitely going to help your breathing Follow with Dr. Delton Coombes in 12 months or sooner if you have any problems.

## 2020-01-08 ENCOUNTER — Other Ambulatory Visit: Payer: Self-pay | Admitting: Family Medicine

## 2020-01-10 MED ORDER — PANTOPRAZOLE SODIUM 20 MG PO TBEC: DELAYED_RELEASE_TABLET | ORAL | 2 refills | Status: DC

## 2020-03-29 ENCOUNTER — Other Ambulatory Visit: Payer: Self-pay | Admitting: Family Medicine

## 2020-04-04 ENCOUNTER — Ambulatory Visit: Payer: Medicaid Other | Attending: Internal Medicine

## 2020-04-04 DIAGNOSIS — Z23 Encounter for immunization: Secondary | ICD-10-CM

## 2020-04-04 NOTE — Progress Notes (Signed)
   Covid-19 Vaccination Clinic  Name:  [name]    MRN: [id number] DOB: [birth year]  04/04/2020  [name] was observed post Covid-19 immunization for 15 minutes without incident. He was provided with Vaccine Information Sheet and instruction to access the V-Safe system.   [name] was instructed to call 911 with any severe reactions post vaccine: Marland Kitchen Difficulty breathing  . Swelling of face and throat  . A fast heartbeat  . A bad rash all over body  . Dizziness and weakness

## 2020-04-28 ENCOUNTER — Other Ambulatory Visit: Payer: Self-pay | Admitting: Family Medicine

## 2020-06-27 ENCOUNTER — Other Ambulatory Visit: Payer: Self-pay | Admitting: Family Medicine

## 2020-06-27 DIAGNOSIS — I10 Essential (primary) hypertension: Secondary | ICD-10-CM

## 2020-07-22 ENCOUNTER — Other Ambulatory Visit: Payer: Self-pay | Admitting: Family Medicine

## 2020-08-06 ENCOUNTER — Other Ambulatory Visit: Payer: Self-pay | Admitting: Family Medicine

## 2020-08-21 ENCOUNTER — Other Ambulatory Visit: Payer: Self-pay | Admitting: Family Medicine

## 2020-10-20 ENCOUNTER — Other Ambulatory Visit: Payer: Self-pay | Admitting: Family Medicine

## 2020-11-10 ENCOUNTER — Other Ambulatory Visit: Payer: Self-pay | Admitting: Family Medicine

## 2020-11-13 ENCOUNTER — Telehealth: Payer: Self-pay | Admitting: Family Medicine

## 2020-11-13 NOTE — Telephone Encounter (Signed)
**  AFTER HOURS CALL**  Patient's mother called the after-hours line reporting the patient has developed "sores in his mouth".  She reports that the patient has Prader-Willi syndrome and has not been able to tell her the he has pain as he has a "high pain tolerance".  She reports that patient has sores on his lips and tongue and that he is having difficulty with eating normally.  She reports that he has been able to tolerate p.o. but he is not like his normal amount.  She denies any fever, denies vomiting, no sore throat, no difficulty breathing and no close contacts with similar symptoms. She also denies he has had no new medications or vitamins. Patient's mother states that he does not appear to be in any acute distress.  She would like to schedule him for an appointment.  Reviewed current available appointments and nothing is available until 4/18.  Patient's mother advised to call [facility] ON 4/12 at opening hours to attempt to schedule SDA. Mother was in agreement with this plan.   Ronnald Ramp, MD  [facility], PGY-2  FPTS Intern Pager [phone]

## 2020-11-14 ENCOUNTER — Encounter: Payer: Self-pay | Admitting: Family Medicine

## 2020-11-14 ENCOUNTER — Ambulatory Visit (INDEPENDENT_AMBULATORY_CARE_PROVIDER_SITE_OTHER): Payer: Medicaid Other | Admitting: Family Medicine

## 2020-11-14 ENCOUNTER — Other Ambulatory Visit: Payer: Self-pay

## 2020-11-14 VITALS — BP 119/77 | HR 105 | Wt 232.6 lb

## 2020-11-14 DIAGNOSIS — K1379 Other lesions of oral mucosa: Secondary | ICD-10-CM

## 2020-11-14 MED ORDER — NYSTATIN 100000 UNIT/ML MT SUSP: 5.0000 mL | Freq: Four times a day (QID) | OROMUCOSAL | 0 refills | Status: DC

## 2020-11-14 NOTE — Patient Instructions (Addendum)
It was nice to meet you today!  Sent in nystatin liquid for you. Use it 4 times a day - swish it around the mouth really well and then swallow.  Call back if not doing better by Thursday.  Be well, Dr. Benedict Needy, Adult Oral thrush, also called oral candidiasis, is a fungal infection that develops in the mouth and throat and on the tongue. It causes white patches to form in the mouth and on the tongue. Many cases of thrush are mild, but this infection can also be serious. Ginette Pitman can be a repeated (recurrent) problem for certain people who have a weak body defense system (immune system). The weakness can be caused by chronic illnesses, or by taking medicines that limit the body's ability to fight infection. If a person has difficulty fighting infection, the fungus that causes thrush can spread through the body. This can cause life-threatening blood or organ infections. What are the causes? This condition is caused by a fungus (yeast) called Candida albicans.  This fungus is normally present in small amounts in the mouth and on other mucous membranes. It usually causes no harm.  If conditions are present that allow the fungus to grow without control, it invades surrounding tissues and becomes an infection.  Other Candida species can also lead to thrush, though this is rare. What increases the risk? The following factors may make you more likely to develop this condition:  Having a weakened immune system.  Being an older adult.  Having diabetes, cancer, or HIV (human immunodeficiency virus).  Having dry mouth (xerostomia).  Being pregnant or breastfeeding.  Having poor dental care, especially in those who have dentures.  Using antibiotic or steroid medicines. What are the signs or symptoms? Symptoms of this condition can vary from mild and moderate to severe and persistent. Symptoms may include:  A burning feeling in the mouth and throat. This can occur at the start  of a thrush infection.  White patches that stick to the mouth and tongue. The tissue around the patches may be red, raw, and painful. If rubbed (during tooth brushing, for example), the patches and the tissue of the mouth may bleed easily.  A bad taste in the mouth or difficulty tasting foods.  A cottony feeling in the mouth.  Pain during eating and swallowing.  Poor appetite.  Cracking at the corners of the mouth.   How is this diagnosed? This condition is diagnosed based on:  A physical exam.  Your medical history. How is this treated? This condition is treated with medicines called antifungals, which prevent the growth of fungi. These medicines are either applied directly to the affected area (topical) or swallowed (oral). The treatment will depend on the severity of the condition.  Mild cases of thrush may be treated with an antifungal mouth rinse or lozenges. Treatment usually lasts about 14 days.  Moderate to severe cases of thrush can be treated with oral antifungal medicine, if they have spread to the esophagus. A topical antifungal medicine may also be used. For some severe infections, treatment may need to continue for more than 14 days. ? Oral antifungal medicines are rarely used during pregnancy because they may be harmful to the unborn child. If you are pregnant, talk with your health care provider about options for treatment.  Persistent or recurrent thrush. For cases of thrush that do not go away or keep coming back: ? Treatment may be needed twice as long as the symptoms last. ?  Treatment will include both oral and topical antifungal medicines. ? People with a weakened immune system can take an antifungal medicine on a continuous basis to prevent thrush infections. It is important to treat conditions that make a person more likely to get thrush, such as diabetes or HIV. Follow these instructions at home: Medicines  Take or use over-the-counter and prescription  medicines only as told by your health care provider.  Talk with your health care provider about an over-the-counter medicine called gentian violet, which kills bacteria and fungi. Relieving soreness and discomfort To help reduce the discomfort of thrush:  Drink cold liquids such as water or iced tea.  Try flavored ice treats or frozen juices.  Eat foods that are easy to swallow, such as gelatin, ice cream, or custard.  Try drinking from a straw if the patches in your mouth are painful.   General instructions  Eat plain, unflavored yogurt as directed by your health care provider. Check the label to make sure the yogurt contains live cultures. This yogurt can help healthy bacteria grow in the mouth and can stop the growth of the fungus that causes thrush.  If you wear dentures, remove the dentures before going to bed, brush them vigorously, and soak them in a cleaning solution as directed by your health care provider.  Rinse your mouth with a warm salt-water mixture several times a day. To make a salt-water mixture, dissolve -1 tsp (3-6 g) of salt in 1 cup (237 mL) of warm water. Contact a health care provider if:  Your symptoms are getting worse or are not improving within 7 days of starting treatment.  You have symptoms of a spreading infection, such as white patches on the skin outside of the mouth.  You are breastfeeding your baby and you have redness and pain in the nipples. Summary  Oral thrush, also called oral candidiasis, is a fungal infection that develops in the mouth and throat and on the tongue. It causes white patches to form in the mouth and on the tongue.  You are more likely to get this condition if you have a weakened immune system or an underlying condition, such as HIV, cancer, or diabetes.  This condition is treated with medicines called antifungals, which prevent the growth of fungi.  Contact a health care provider if your symptoms do not improve, or get worse,  within 7 days of starting treatment. This information is not intended to replace advice given to you by your health care provider. Make sure you discuss any questions you have with your health care provider. Document Revised: 05/28/2019 Document Reviewed: 05/28/2019 Elsevier Patient Education  2021 [employer].

## 2020-11-16 ENCOUNTER — Telehealth: Payer: Self-pay

## 2020-11-16 MED ORDER — VALACYCLOVIR HCL 1 G PO TABS: 1000.0000 mg | ORAL_TABLET | Freq: Two times a day (BID) | ORAL | 0 refills | Status: AC

## 2020-11-16 NOTE — Telephone Encounter (Signed)
Mother returns call to nurse line requesting update on previous message.   Veronda Prude, RN

## 2020-11-16 NOTE — Telephone Encounter (Signed)
Patient's mother calls nurse line regarding office visit from 4/12. Mother reports that areas have only "improved slightly". Mother reports that patient is still having difficulties eating due to pain. Mother reports good PO fluid intake. Denies known fever, however, patient has been taking tylenol for pain. Mother is unsure if this could be suppressing possible fever.   Will forward to Dr. Pollie Meyer for next steps.   Veronda Prude, RN

## 2020-11-16 NOTE — Telephone Encounter (Signed)
Returned call to mother Tongue is looking somewhat better, but has not entirely improved She estimates pain is about 20% better.  Given some improvement, will continue nystatin Since there is not significant improvement, however, will add valtrex in case this is a herpes virus gingivostomatitis  rx valtrex 1g twice daily for 7 days. Also advised he can take ibuprofen for pain control  They will call back if not better by Monday or Tuesday. At that point would refer to ENT.  Mom appreciative & agreeable to this plan.  Latrelle Dodrill, MD

## 2020-11-19 NOTE — Progress Notes (Signed)
  Date of Visit: 11/14/2020   SUBJECTIVE:   HPI:  [name] presents today for a same day appointment to discuss bumps on his tongue. He is accompanied by his sister who provides the history, as does mom over the phone.  Reported to his family last night that his tongue was painful and they noticed white bumps on his tongue. No history of this in the past. It is possible it has been there longer than just since last night, but last night was the first time he reported it. No new medications or foods. No fevers. Having trouble eating and drinking because it is painful, but has been able to eat (just slowly).  Medical history significant for gout, OHS, prader-willi syndrome, type 2 diabetes.  OBJECTIVE:   BP 119/77   Pulse (!) 105   Wt 232 lb 9.6 oz (105.5 kg)   SpO2 99%   BMI 56.08 kg/m  Gen: no acute distress, pleasant, cooperative, overall well appearing HEENT: white nodular lesions randomly distributed on tongue in no particular distribution. Lesions are palpable and do not scrape off. No bleeding of tongue. No other oral lesions noted. No anterior cervical or supraclavicular lymphadenopathy. Nares patent  ASSESSMENT/PLAN:   Painful spots on tongue Exact etiology unclear. Appearance is atypical for thrush, but this still seems the most likely cause. Could also be a viral process. KOH prep performed today in office and was negative. Common things being common, we elected to treat empirically for thrush today with nystatin swish and swallow. Asked his family to call back in 2 days if not improved, but to let me know either way how he is doing later this week. If not improved at that point would consider adding antiviral medication in case this is some form of gingivostomatitis. Also had my colleague Dr. Leveda Anna see patient, and he agrees with this plan.  [name], MD [facility]

## 2021-01-18 ENCOUNTER — Other Ambulatory Visit: Payer: Self-pay | Admitting: Family Medicine

## 2021-02-25 ENCOUNTER — Other Ambulatory Visit: Payer: Self-pay | Admitting: Family Medicine

## 2021-03-01 NOTE — Telephone Encounter (Signed)
Spoke with mother. Made appt for 8/9. Aquilla Solian, CMA

## 2021-03-13 ENCOUNTER — Encounter: Payer: Self-pay | Admitting: Family Medicine

## 2021-03-13 ENCOUNTER — Other Ambulatory Visit: Payer: Self-pay

## 2021-03-13 ENCOUNTER — Ambulatory Visit (INDEPENDENT_AMBULATORY_CARE_PROVIDER_SITE_OTHER): Payer: Medicaid Other | Admitting: Family Medicine

## 2021-03-13 VITALS — BP 92/69 | HR 93 | Ht <= 58 in | Wt 247.0 lb

## 2021-03-13 DIAGNOSIS — E118 Type 2 diabetes mellitus with unspecified complications: Secondary | ICD-10-CM

## 2021-03-13 DIAGNOSIS — I10 Essential (primary) hypertension: Secondary | ICD-10-CM

## 2021-03-13 DIAGNOSIS — B379 Candidiasis, unspecified: Secondary | ICD-10-CM | POA: Diagnosis not present

## 2021-03-13 LAB — POCT GLYCOSYLATED HEMOGLOBIN (HGB A1C): HbA1c, POC (controlled diabetic range): 5.6 % (ref 0.0–7.0)

## 2021-03-13 MED ORDER — AMLODIPINE BESYLATE 10 MG PO TABS: 10.0000 mg | ORAL_TABLET | Freq: Every day | ORAL | 3 refills | Status: DC

## 2021-03-13 MED ORDER — NYSTATIN 100000 UNIT/ML MT SUSP: 5.0000 mL | Freq: Four times a day (QID) | OROMUCOSAL | 0 refills | Status: DC

## 2021-03-13 NOTE — Progress Notes (Signed)
    SUBJECTIVE:   CHIEF COMPLAINT / HPI:   Patient presents accompanied by sister for check up and desiring refills. Enjoying summer thus far, recently went on his first plane ride to [place] for a family vacation which went well.   PERTINENT  PMH / PSH:   Type 2 DM Most recent A1c 5.7. Compliant on metformin daily, tolerating medication well without side effects. Checks blood glucose levels about 3 times a week which range around 90-112 most recently. Denies any dizziness or other symptoms to indicate hypoglycemic episodes. Last ophthalmology appointment was about 5-6 months ago.   Hypertension Compliant on amlodipine daily, tolerating medication well. Denies chest pain, dyspnea and palpitations. Has been working on [employer] extensively, mother has placed him on a very strict and healthy diet. Has been exercising multiple times a week, participates in water aerobics about 2-3 times a week and goes walking about 3 times a week. Eating a more healthy diet as well.   OBJECTIVE:   BP 92/69   Pulse 93   Ht 4\' 6"  (1.372 m)   Wt 247 lb (112 kg)   SpO2 100%   BMI 59.55 kg/m   General: Patient well-appearing, in no acute distress. HEENT: normocephalic, atraumatic, non-tender thyroid, no cervical LAD noted CV: RRR, no murmurs or gallops auscultated Resp: CTAB, no wheezing, rales or rhonchi noted Abdomen: soft, nontender, presence of bowel sounds Ext: radial and distal pulses strong and equal bilaterally, foot exam normal with gross sensation intact with normal microfilament testing, normal DTRs, no wounds, rashes or lesions noted  Psych: mood appropriate, pleasant   ASSESSMENT/PLAN:   Type 2 diabetes mellitus with complication, without long-term current use of insulin (HCC) -A1c 5.6, well-controlled -continue metformin -encouraged continued annual ophthalmology visits -foot exam performed  -follow up in 6 months  Essential hypertension, benign -BP 92/69,  well-controlled -continue amlodipine, refills provided  -diet and exercise counseling provided  -PHQ-9 score of 0, reviewed. -Refills provided on nystatin as well, instructed family to inform office if any further refills need to be prescribed.   08-16-1976, DO [facility]

## 2021-03-13 NOTE — Assessment & Plan Note (Signed)
-  A1c 5.6, well-controlled -continue metformin -encouraged continued annual ophthalmology visits -foot exam performed  -follow up in 6 months

## 2021-03-13 NOTE — Patient Instructions (Addendum)
It was great seeing you today!  Today we discussed your diabetes. Your A1c was 5.6 which means your diabetes is well-controlled. Keep eating healthy and staying active as we discussed. I have provided you with refills on your amlodipine and nystatin. Your blood pressure is also well-controlled.   Please follow up at your next scheduled appointment in 6 months, if anything arises between now and then, please don't hesitate to contact our office.   Thank you for allowing [name] to be a part of your medical care!  Thank you, Dr. Robyne Peers

## 2021-03-13 NOTE — Assessment & Plan Note (Signed)
-  BP 92/69, well-controlled -continue amlodipine, refills provided  -diet and exercise counseling provided

## 2021-03-14 ENCOUNTER — Other Ambulatory Visit: Payer: Self-pay | Admitting: Family Medicine

## 2021-03-14 ENCOUNTER — Telehealth: Payer: Self-pay

## 2021-03-14 DIAGNOSIS — B379 Candidiasis, unspecified: Secondary | ICD-10-CM

## 2021-03-14 MED ORDER — NYSTATIN 100000 UNIT/GM EX POWD: 1.0000 "application " | Freq: Three times a day (TID) | CUTANEOUS | 3 refills | Status: AC

## 2021-03-14 NOTE — Telephone Encounter (Signed)
Patient's mother calls nurse line regarding nystatin rx. Mother reports this is supposed to be in powder not liquid. Mother reports he uses the powder in his skin folds.   Please advise if rx can be updated from liquid to powder. New rx will need to be sent to pharmacy if appropriate.   Veronda Prude, RN

## 2021-03-18 ENCOUNTER — Other Ambulatory Visit: Payer: Self-pay | Admitting: Family Medicine

## 2021-04-17 ENCOUNTER — Other Ambulatory Visit: Payer: Self-pay | Admitting: Family Medicine

## 2021-05-08 ENCOUNTER — Other Ambulatory Visit: Payer: Self-pay | Admitting: Family Medicine

## 2021-05-29 ENCOUNTER — Telehealth: Payer: Self-pay | Admitting: Emergency Medicine

## 2021-05-29 NOTE — Telephone Encounter (Signed)
Have looked through Dr. Kavin Leech box and did not see any paperwork. PCCs do you have a CMN?

## 2021-05-30 NOTE — Telephone Encounter (Signed)
I do not have a cmn on this patient

## 2021-05-30 NOTE — Telephone Encounter (Signed)
Spoke with [employer] and notified them that no paperwork of any kind had been received in office. [employer] stated they would refax paper work over to office. Will leave encounter open till paper work is received

## 2021-05-31 NOTE — Telephone Encounter (Signed)
[name], they were supposed to have faxed this again yesterday, did you ever receive this?

## 2021-06-01 NOTE — Telephone Encounter (Signed)
Spoke to Pole Ojea with [employer] and asked that she fax CMN to [phone].

## 2021-06-01 NOTE — Telephone Encounter (Signed)
No I havent got anything on this patient they can fax to my office [phone]

## 2021-06-10 ENCOUNTER — Other Ambulatory Visit: Payer: Self-pay | Admitting: Family Medicine

## 2021-06-20 ENCOUNTER — Other Ambulatory Visit: Payer: Self-pay | Admitting: Family Medicine

## 2021-07-16 ENCOUNTER — Other Ambulatory Visit: Payer: Self-pay | Admitting: Family Medicine

## 2021-07-20 ENCOUNTER — Other Ambulatory Visit: Payer: Self-pay | Admitting: Family Medicine

## 2021-07-30 ENCOUNTER — Other Ambulatory Visit: Payer: Self-pay | Admitting: Family Medicine

## 2021-08-15 ENCOUNTER — Other Ambulatory Visit: Payer: Self-pay | Admitting: Family Medicine

## 2021-08-17 ENCOUNTER — Other Ambulatory Visit: Payer: Self-pay | Admitting: Family Medicine

## 2021-09-10 ENCOUNTER — Other Ambulatory Visit: Payer: Self-pay | Admitting: Family Medicine

## 2021-09-21 ENCOUNTER — Other Ambulatory Visit: Payer: Self-pay

## 2021-09-21 MED ORDER — ACCU-CHEK GUIDE W/DEVICE KIT: PACK | 0 refills | Status: DC

## 2021-09-21 MED ORDER — ACCU-CHEK FASTCLIX LANCETS MISC: 6 refills | Status: DC

## 2021-09-21 MED ORDER — ACCU-CHEK GUIDE VI STRP
ORAL_STRIP | 2 refills | Status: DC
Start: 2021-09-21 — End: 2022-05-01

## 2021-10-13 ENCOUNTER — Other Ambulatory Visit: Payer: Self-pay | Admitting: Family Medicine

## 2021-11-09 ENCOUNTER — Other Ambulatory Visit: Payer: Self-pay | Admitting: Family Medicine

## 2021-11-12 ENCOUNTER — Other Ambulatory Visit: Payer: Self-pay | Admitting: Family Medicine

## 2021-12-10 ENCOUNTER — Other Ambulatory Visit: Payer: Self-pay | Admitting: Family Medicine

## 2021-12-10 ENCOUNTER — Ambulatory Visit: Payer: Medicaid Other | Admitting: Family Medicine

## 2021-12-26 ENCOUNTER — Ambulatory Visit (INDEPENDENT_AMBULATORY_CARE_PROVIDER_SITE_OTHER): Payer: Medicaid Other | Admitting: Emergency Medicine

## 2021-12-26 ENCOUNTER — Encounter: Payer: Self-pay | Admitting: Emergency Medicine

## 2021-12-26 DIAGNOSIS — E662 Morbid (severe) obesity with alveolar hypoventilation: Secondary | ICD-10-CM | POA: Diagnosis not present

## 2021-12-26 DIAGNOSIS — J45909 Unspecified asthma, uncomplicated: Secondary | ICD-10-CM

## 2021-12-26 MED ORDER — FLOVENT HFA 110 MCG/ACT IN AERO: 2.0000 | INHALATION_SPRAY | Freq: Two times a day (BID) | RESPIRATORY_TRACT | 2 refills | Status: DC

## 2021-12-26 NOTE — Progress Notes (Signed)
Subjective:    Patient ID: BODYN ROBUCK, male    DOB: 07/27/1996, 26 y.o.   MRN: [id number]   Asthma He complains of shortness of breath. There is no cough or wheezing. Pertinent negatives include no ear pain, fever, headaches, postnasal drip, rhinorrhea, sneezing, sore throat or trouble swallowing. His past medical history is significant for asthma.   ROV 12/30/19 --follow-up visit for 26 year old gentleman with Prader-Willi syndrome, associated obesity, severe OSA OHS which we have treated with a trilogy ventilator.  No longer needs O2 w exertion.   We speculate that he may have some obstructive lung disease and has been managed with Flovent. No albuterol use. COVID shot up to date - Moderna. He is now back to going to the [facility]. No trouble with his trilogy, gets his equipment reliably. Need to renew his O2 and trilogy to [employer] > per mom.  ROV 12/26/21 --Mr. Ferryman is a very pleasant 26 year old gentleman with history of Prader-Willi syndrome.  He has associated obesity and severe OSA/OHS.  He uses a trilogy ventilator.  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  He is no longer on exertional oxygen. He is more active than last year - has lost significant wt in the last year.  Occasional albuterol use. No flares or hospitalizations.  He has great compliance with his ventilator. He gets benefit - less sleepy, better daytime energy, less HA. They have all equipment, but need documentation from this office visit so he can continue to do so.  His DME is [employer].   Trilogy ventilator compliance report available from 09/12/2021 through 10/11/2021.  Showed that he used it 100% of the time on average 7.3 hours.  His average tidal volume was 417 cc, average minute ventilation 7.1 L/min, average settings 20/6    Review of Systems  Constitutional:  Negative for fever and unexpected weight change.  HENT:  Negative for congestion, dental problem, ear pain, nosebleeds, postnasal  drip, rhinorrhea, sinus pressure, sneezing, sore throat and trouble swallowing.   Eyes:  Negative for redness and itching.  Respiratory:  Positive for shortness of breath. Negative for cough, chest tightness and wheezing.   Cardiovascular:  Negative for palpitations and leg swelling.  Gastrointestinal:  Negative for nausea and vomiting.  Genitourinary:  Negative for dysuria.  Musculoskeletal:  Negative for joint swelling.  Skin:  Negative for rash.  Neurological:  Negative for headaches.  Hematological:  Does not bruise/bleed easily.  Psychiatric/Behavioral:  Negative for dysphoric mood. The patient is not nervous/anxious.       Objective:   Physical Exam Vitals:   12/26/21 0912  BP: 130/72  Pulse: (!) 103  Temp: 98.1 F (36.7 C)  TempSrc: Oral  SpO2: 98%  Weight: 260 lb 6.4 oz (118.1 kg)  Height: 4\' 6"  (1.372 m)   Gen: Pleasant, morbidly obese, in no distress, Interacting and answering questions. Comfortable on RA  ENT: No lesions,  mouth clear,  oropharynx clear, no active  postnasal drip  Neck: No JVD, no stridor  Lungs: clear bilaterally, no wheeze  Cardiovascular: RRR, heart sounds normal, no murmur or gallops, no peripheral edema  Musculoskeletal: No deformities, no cyanosis or clubbing  Neuro: alert, answers simple questions, moves all ext, faces consistent w his hx P-W syndrome.   Skin: Warm, no lesions or rashes     Assessment & Plan:  Obesity hypoventilation syndrome ([place]) He has weight fluctuations but has done well with exercise, is losing weight over the last year.  [employer]  compliance with his trilogy ventilator per his mom and per his sister.  No problems with the machine and its functioning.  His equipment is on hold as we need to document his usage and benefit.  Sister reports that his exertional tolerance, energy level are better during the day with usage of his device.  He also has less headache.  Plan to continue same device, same  settings.  Asthma Continue Flovent 2 puffs twice a day.  Rare albuterol use.  Exercise tolerance is improved compared with last time.  No flares.  Maintaining appropriate vaccinations.  Baltazar Apo, MD, PhD 12/26/2021, 9:33 AM [facility] [phone] or if no answer [phone]

## 2021-12-26 NOTE — Assessment & Plan Note (Signed)
Continue Flovent 2 puffs twice a day.  Rare albuterol use.  Exercise tolerance is improved compared with last time.  No flares.  Maintaining appropriate vaccinations.

## 2021-12-26 NOTE — Addendum Note (Signed)
Addended by: Retia Passe on: 12/26/2021 09:37 AM   Modules accepted: Orders

## 2021-12-26 NOTE — Addendum Note (Signed)
Addended by: Retia Passe on: 12/26/2021 09:51 AM   Modules accepted: Orders

## 2021-12-26 NOTE — Patient Instructions (Signed)
We documented today that you are using your trilogy ventilator reliably every night and that you are benefiting from it.  Please keep up the good work.  We will send office notes from 12/26/2021 to [employer] so that you can continue to get appropriate equipment, maintenance. Please continue Flovent 2 puffs twice a day.  Rinse and gargle after using. Keep your albuterol available to use 2 puffs when you needed for shortness of breath, chest tightness, wheezing. Congratulations on your weight loss and exercise routine.  Keep up the good work. Follow with Dr. Delton Coombes in 12 months or sooner if you have any problems.

## 2021-12-26 NOTE — Assessment & Plan Note (Signed)
He has weight fluctuations but has done well with exercise, is losing weight over the last year.  Great compliance with his trilogy ventilator per his mom and per his sister.  No problems with the machine and its functioning.  His equipment is on hold as we need to document his usage and benefit.  Sister reports that his exertional tolerance, energy level are better during the day with usage of his device.  He also has less headache.  Plan to continue same device, same settings.

## 2022-01-04 ENCOUNTER — Encounter: Payer: Self-pay | Admitting: Family Medicine

## 2022-01-04 ENCOUNTER — Ambulatory Visit (INDEPENDENT_AMBULATORY_CARE_PROVIDER_SITE_OTHER): Payer: Medicaid Other | Admitting: Family Medicine

## 2022-01-04 VITALS — BP 135/84 | HR 106 | Ht <= 58 in | Wt 262.2 lb

## 2022-01-04 DIAGNOSIS — E118 Type 2 diabetes mellitus with unspecified complications: Secondary | ICD-10-CM

## 2022-01-04 DIAGNOSIS — I10 Essential (primary) hypertension: Secondary | ICD-10-CM | POA: Diagnosis not present

## 2022-01-04 LAB — POCT GLYCOSYLATED HEMOGLOBIN (HGB A1C): HbA1c, POC (controlled diabetic range): 5.8 % (ref 0.0–7.0)

## 2022-01-04 MED ORDER — SEMAGLUTIDE(0.25 OR 0.5MG/DOS) 2 MG/1.5ML ~~LOC~~ SOPN
0.2500 mg | PEN_INJECTOR | SUBCUTANEOUS | 3 refills | Status: DC
Start: 2022-01-04 — End: 2022-02-08

## 2022-01-04 NOTE — Progress Notes (Signed)
    SUBJECTIVE:   CHIEF COMPLAINT / HPI:   Patient with history of Prader-Willi presents for annual check up, he is here with his sister. Mother is also on the phone for a portion of the visit. They are interested in starting an injection for weight loss, otherwise denies any concerns today. Denies chest pain, dyspnea and worsening leg swelling.Compliant on antihypertensive regimen of  amlodipine daily. Also taking torsemide which sister has confirmed has helped his legs stop swelling overall. Trying to eat  a more balanced diet and staying active, especially during the summer. Compliant on metformin but taking 500 mg now instead of 1000 mg. Denies any hypoglycemic episodes. Has been seen by the ophthalmologist last month.    OBJECTIVE:   BP 135/84   Pulse (!) 106   Ht 4\' 6"  (1.372 m)   Wt 262 lb 4 oz (119 kg)   BMI 63.23 kg/m   General: Patient well-appearing, in no acute distress. HEENT: PERRLA, non-tender thyroid, no evidence of cervical LAD, normal buccal mucosa  CV: RRR, no murmurs or gallops auscultated Resp: CTAB, no wheezing, rales or rhonchi noted Abdomen: soft, nontender, nondistended, presence of bowel sounds Ext: chronic lymphedema, no pitting LE edema noted bilaterally Psych: mood appropriate, pleasant   ASSESSMENT/PLAN:   Type 2 diabetes mellitus with complication, without long-term current use of insulin (HCC) -A1c 5.8, well-controlled -continue metformin -initiated ozempic 0.25mg  weekly, med administration instructions discussed, plan to titrate up to 0.5 in 4 weeks -routine annual ophthalmology follow up  -pending lipid panel, consider adding statin if appropriate -diet and exercise counseling  -follow up in 1 month at which we can titrate ozempic to 0.5 mg   Essential hypertension, benign -BP 135/84, controlled -continue amlodipine and torsemide  -pending BMP to monitor renal function and electrolytes which on chronic diuresis   -Med rec reviewed and updated  appropriately  -PHQ-9 score of 0 reviewed.     Donney Dice, [place]

## 2022-01-04 NOTE — Assessment & Plan Note (Signed)
-  A1c 5.8, well-controlled -continue metformin -initiated ozempic 0.25mg  weekly, med administration instructions discussed, plan to titrate up to 0.5 in 4 weeks -routine annual ophthalmology follow up  -pending lipid panel, consider adding statin if appropriate -diet and exercise counseling  -follow up in 1 month at which we can titrate ozempic to 0.5 mg

## 2022-01-04 NOTE — Patient Instructions (Addendum)
It was great seeing you today!  Today we discussed your high blood pressure and diabetes. Please continue taking all the medications that you are on. You may continue to take metformin 500 mg. We will start ozempic, please inject 0.25 mg once every week for 4 weeks. After a month, we will increase dose to 0.5 mg.   We also did blood work today to check cholesterol, kidney function and electrolytes. I will let you know of any abnormal results.   Please follow up at your next scheduled appointment in 1 month, if anything arises between now and then, please don't hesitate to contact our office.   Thank you for allowing [name] to be a part of your medical care!  Thank you, Dr. Robyne Peers

## 2022-01-04 NOTE — Assessment & Plan Note (Signed)
-  BP 135/84, controlled -continue amlodipine and torsemide  -pending BMP to monitor renal function and electrolytes which on chronic diuresis

## 2022-01-07 ENCOUNTER — Other Ambulatory Visit: Payer: Medicaid Other

## 2022-01-08 ENCOUNTER — Encounter: Payer: Self-pay | Admitting: *Deleted

## 2022-01-08 LAB — LIPID PANEL
Chol/HDL Ratio: 3.7 ratio (ref 0.0–5.0)
Cholesterol, Total: 172 mg/dL (ref 100–199)
HDL: 47 mg/dL (ref >39)
LDL Chol Calc (NIH): 96 mg/dL (ref 0–99)
Triglycerides: 169 mg/dL — ABNORMAL HIGH (ref 0–149)
VLDL Cholesterol Cal: 29 mg/dL (ref 5–40)

## 2022-01-08 LAB — BASIC METABOLIC PANEL
BUN/Creatinine Ratio: 40 — ABNORMAL HIGH (ref 9–20)
BUN: 34 mg/dL — ABNORMAL HIGH (ref 6–20)
CO2: 26 mmol/L (ref 20–29)
Calcium: 9.8 mg/dL (ref 8.7–10.2)
Chloride: 100 mmol/L (ref 96–106)
Creatinine, Ser: 0.84 mg/dL (ref 0.76–1.27)
Glucose: 89 mg/dL (ref 70–99)
Potassium: 3.6 mmol/L (ref 3.5–5.2)
Sodium: 142 mmol/L (ref 134–144)
eGFR: 123 mL/min/{1.73_m2} (ref >59)

## 2022-01-13 ENCOUNTER — Other Ambulatory Visit: Payer: Self-pay | Admitting: Family Medicine

## 2022-02-06 ENCOUNTER — Other Ambulatory Visit: Payer: Self-pay | Admitting: Family Medicine

## 2022-02-08 ENCOUNTER — Encounter: Payer: Self-pay | Admitting: Family Medicine

## 2022-02-08 ENCOUNTER — Ambulatory Visit (INDEPENDENT_AMBULATORY_CARE_PROVIDER_SITE_OTHER): Payer: Medicaid Other | Admitting: Family Medicine

## 2022-02-08 ENCOUNTER — Other Ambulatory Visit (HOSPITAL_COMMUNITY): Payer: Self-pay

## 2022-02-08 DIAGNOSIS — E118 Type 2 diabetes mellitus with unspecified complications: Secondary | ICD-10-CM

## 2022-02-08 MED ORDER — SEMAGLUTIDE(0.25 OR 0.5MG/DOS) 2 MG/1.5ML ~~LOC~~ SOPN: 0.5000 mg | PEN_INJECTOR | SUBCUTANEOUS | 3 refills | Status: DC

## 2022-02-08 NOTE — Patient Instructions (Signed)
It was great seeing you today!  Today we discussed your ozempic, I am glad that you are doing well on this. We will increase the dose to ozempic 0.5 mg, please inject 0.5 mg once every week. If you notice any side effects especially stomach issues then please contact me. Your blood pressure looks great today, please keep taking all your medications as prescribed.   Please follow up at your next scheduled appointment in 3 months, if anything arises between now and then, please don't hesitate to contact our office.   Thank you for allowing [name] to be a part of your medical care!  Thank you, Dr. Robyne Peers

## 2022-02-08 NOTE — Progress Notes (Signed)
    SUBJECTIVE:   CHIEF COMPLAINT / HPI:    Patient presents for follow up, he is accompanied by sister. Mother also here via facetime. Recently started on ozempic and mother reports that he has been tolerating this medication well. No nausea, vomiting and abdominal pain. Continues to have soft, formed stools daily without hematochezia. Compliant on ozempic 0.25 mg. A1c 5.8 a month ago. Still taking metformin. Also compliant on antihypertensive regimen. Denies any chest pain, dyspnea or worsening swelling.   OBJECTIVE:   BP 132/80   Pulse 97   Wt 259 lb (117.5 kg)   SpO2 97%   BMI 62.45 kg/m   General: Patient well-appearing, in no acute distress. CV: RRR, no murmurs or gallops auscultated Resp: CTAB, no wheezing, rales or rhonchi noted Abdomen: soft, nontender, nondistended, presence of bowel sounds Ext: chronic edema representing lymphedema, no sores or ulcers noted Psych: mood appropriate   ASSESSMENT/PLAN:   Type 2 diabetes mellitus with complication, without long-term current use of insulin (HCC) -A1c 5.8, at goal -continue metformin 500 mg daily -increase ozempic to 0.5 mg weekly as patient tolerating well  -mom and sister agree with plan in place  -follow up in 3 months for A1c   Obesity, morbid (HCC) -increase ozempic to 0.5 mg weekly     Reece Leader, DO [facility]

## 2022-02-08 NOTE — Assessment & Plan Note (Addendum)
-  A1c 5.8, at goal -continue metformin 500 mg daily -increase ozempic to 0.5 mg weekly as patient tolerating well  -mom and sister agree with plan in place  -follow up in 3 months for A1c

## 2022-02-08 NOTE — Assessment & Plan Note (Signed)
-  increase ozempic to 0.5 mg weekly

## 2022-02-11 ENCOUNTER — Telehealth: Payer: Self-pay

## 2022-02-11 ENCOUNTER — Other Ambulatory Visit (HOSPITAL_COMMUNITY): Payer: Self-pay

## 2022-02-11 NOTE — Telephone Encounter (Signed)
A Prior Authorization was initiated for this patients OZEMPIC through [employer].   CONFIRMATION #[id number]

## 2022-02-12 NOTE — Telephone Encounter (Signed)
Prior Auth for patients medication OZEMPIC approved by MEDICAID from 02/11/22 to 02/11/23.

## 2022-02-13 ENCOUNTER — Other Ambulatory Visit: Payer: Self-pay | Admitting: Family Medicine

## 2022-02-26 ENCOUNTER — Telehealth: Payer: Self-pay

## 2022-02-26 NOTE — Telephone Encounter (Signed)
Patients mother calls nurse line reporting positive covid test today.  Mother reports she tested positive today, which prompted her to test him.   Mother reports the only symptom he has is a "runny nose" that started yesterday.   Mother denies any fevers, body aches, cough, congestion, GI symptoms or SOB at this time.   Conservative measures given to mother, warm teas with honey and to stay hydrated.  She is interested in the anti viral therapy.   Will forward to PCP.

## 2022-03-18 ENCOUNTER — Other Ambulatory Visit: Payer: Self-pay | Admitting: Family Medicine

## 2022-04-04 ENCOUNTER — Telehealth: Payer: Self-pay | Admitting: Emergency Medicine

## 2022-04-04 NOTE — Telephone Encounter (Signed)
Called to get Authorization for 02 equipment, Office Notes and CMN form.  Please advise and call to discuss at [phone]

## 2022-04-05 NOTE — Telephone Encounter (Signed)
Community msg to [employer]

## 2022-04-05 NOTE — Telephone Encounter (Signed)
I have not received a form for this patient or a cmn

## 2022-04-07 ENCOUNTER — Other Ambulatory Visit: Payer: Self-pay | Admitting: Family Medicine

## 2022-04-07 DIAGNOSIS — I10 Essential (primary) hypertension: Secondary | ICD-10-CM

## 2022-04-18 ENCOUNTER — Other Ambulatory Visit: Payer: Self-pay | Admitting: Family Medicine

## 2022-04-26 NOTE — Telephone Encounter (Signed)
Checked Leslie's box and saw that this community message was sent back: New, Nyoka Cowden, Terance Hart, CMA; New, [place] the account rep that is working that [facility] and let them know.  I don't see a copy to send, but They have the [phone] fax number listed.   Thank you,   Demetrius Charity

## 2022-04-30 ENCOUNTER — Other Ambulatory Visit: Payer: Self-pay | Admitting: Family Medicine

## 2022-05-12 ENCOUNTER — Other Ambulatory Visit: Payer: Self-pay | Admitting: Family Medicine

## 2022-05-22 ENCOUNTER — Telehealth: Payer: Self-pay | Admitting: Emergency Medicine

## 2022-05-22 NOTE — Telephone Encounter (Signed)
Community message sent to [facility] with [employer] to pull office notes and that if there are any issues we will fax office notes if unalb t o see the notes from may 2023 till present. Nothing further needed

## 2022-05-22 NOTE — Telephone Encounter (Signed)
[employer] called to request the office notes for patient from May 2023 forward with discussion for oxygen.  They stated that is the only thing missing for this request.  Please call to discuss further or fax the information to them.  CB# [phone]

## 2022-05-27 ENCOUNTER — Other Ambulatory Visit: Payer: Self-pay | Admitting: Family Medicine

## 2022-06-06 ENCOUNTER — Other Ambulatory Visit: Payer: Self-pay | Admitting: Family Medicine

## 2022-06-22 ENCOUNTER — Other Ambulatory Visit: Payer: Self-pay | Admitting: Family Medicine

## 2022-07-15 ENCOUNTER — Telehealth: Payer: Self-pay

## 2022-07-15 NOTE — Telephone Encounter (Signed)
Mother calls nurse line requesting a prescription for the RSV vaccine.   She reports she took him to the pharmacy to receive it, however due to his age he is needing a prescription.   Will forward to PCP.

## 2022-07-16 ENCOUNTER — Other Ambulatory Visit: Payer: Self-pay | Admitting: Family Medicine

## 2022-07-16 DIAGNOSIS — Z2911 Encounter for prophylactic immunotherapy for respiratory syncytial virus (RSV): Secondary | ICD-10-CM

## 2022-07-17 NOTE — Telephone Encounter (Signed)
Mother advised vaccine was sent to pharmacy.

## 2022-07-25 ENCOUNTER — Other Ambulatory Visit: Payer: Self-pay

## 2022-07-25 MED ORDER — TORSEMIDE 20 MG PO TABS: ORAL_TABLET | ORAL | 0 refills | Status: DC

## 2022-08-16 ENCOUNTER — Encounter: Payer: Self-pay | Admitting: Family Medicine

## 2022-08-16 ENCOUNTER — Ambulatory Visit (INDEPENDENT_AMBULATORY_CARE_PROVIDER_SITE_OTHER): Payer: Medicaid Other | Admitting: Family Medicine

## 2022-08-16 VITALS — BP 123/79 | HR 100 | Ht <= 58 in | Wt 261.1 lb

## 2022-08-16 DIAGNOSIS — I10 Essential (primary) hypertension: Secondary | ICD-10-CM | POA: Diagnosis not present

## 2022-08-16 DIAGNOSIS — E118 Type 2 diabetes mellitus with unspecified complications: Secondary | ICD-10-CM

## 2022-08-16 DIAGNOSIS — Z23 Encounter for immunization: Secondary | ICD-10-CM | POA: Diagnosis not present

## 2022-08-16 DIAGNOSIS — Z7184 Encounter for health counseling related to travel: Secondary | ICD-10-CM | POA: Diagnosis not present

## 2022-08-16 LAB — POCT GLYCOSYLATED HEMOGLOBIN (HGB A1C): HbA1c, POC (controlled diabetic range): 5.6 % (ref 0.0–7.0)

## 2022-08-16 NOTE — Assessment & Plan Note (Signed)
-  initial BP elevation of 153/98, on repeat normalized to 123/79 (at goal) -continue amlodipine daily

## 2022-08-16 NOTE — Progress Notes (Signed)
    SUBJECTIVE:   CHIEF COMPLAINT / HPI:   Patient presents accompanied by sister to ensure he is okay to travel. Mother also available on facetime. The family is planning on a trip to go to [place] in June. He is very excited and they will be flying on a 3-4 hour flight. History of DM, last saw the eye doctor in October 2023. Remains compliant on prescribed medications including his DM regimen. Denies chest pain, dyspnea or other symptoms. No worsening leg swelling than baseline lymphedema noted.   OBJECTIVE:   BP 123/79   Pulse 100   Ht 4\' 6"  (1.372 m)   Wt 261 lb 2 oz (118.4 kg)   SpO2 100%   BMI 62.96 kg/m   General: Patient well-appearing, in no acute distress. CV: RRR, no murmurs or gallops auscultated Resp: CTAB, no wheezing, rales or rhonchi noted Ext: non-pitting LE edema noted bilaterally, normal foot exam without ulcers or wounds noted, normal microfilament testing   ASSESSMENT/PLAN:   Essential hypertension, benign -initial BP elevation of 153/98, on repeat normalized to 123/79 (at goal) -continue amlodipine daily   Type 2 diabetes mellitus with complication, without long-term current use of insulin (HCC) -A1c 5.6, at goal  -continue current DM regimen of semaglutide and metformin -foot exam wnl -up to date on ophthalmology follow up -follow up in 3-6 months for next A1c  Travel advice encounter -patient appropriate to travel, instructed family to ensure that he has all his medications and to let me know if refills required closer to time of departure. Also to take all equipment such as CPAP given history of OSA -walking to prevent DVT -use of bug spray to avoid mosquito bites and to stay hydrated/cool  -Hep B administered today -reassurance provided    -PHQ-9 score of 0 reviewed.    Donney Dice, [place]

## 2022-08-16 NOTE — Assessment & Plan Note (Signed)
-  patient appropriate to travel, instructed family to ensure that he has all his medications and to let me know if refills required closer to time of departure. Also to take all equipment such as CPAP given history of OSA -walking to prevent DVT -use of bug spray to avoid mosquito bites and to stay hydrated/cool  -Hep B administered today -reassurance provided

## 2022-08-16 NOTE — Assessment & Plan Note (Signed)
-  A1c 5.6, at goal  -continue current DM regimen of semaglutide and metformin -foot exam wnl -up to date on ophthalmology follow up -follow up in 3-6 months for next A1c

## 2022-08-16 NOTE — Patient Instructions (Signed)
It was great seeing you today!  Today we discussed your diabetes, I will let you know of the result of your A1c which we got today. Please continue taking all your medications.  Have fun on your trip to [place], make sure to take all your medications and your CPAP machine. If you need refills before you go on your trip then let me know. Wear bug spray to prevent insect and mosquito bites.   Please follow up at your next scheduled appointment in 3 months, if anything arises between now and then, please don't hesitate to contact our office.   Thank you for allowing [name] to be a part of your medical care!  Thank you, Dr. Larae Grooms  Also a reminder of our clinic's no-show policy. Please make sure to arrive at least 15 minutes prior to your scheduled appointment time. Please try to cancel before 24 hours if you are not able to make it. If you no-show for 2 appointments then you will be receiving a warning letter. If you no-show after 3 visits, then you may be at risk of being dismissed from our clinic. This is to ensure that everyone is able to be seen in a timely manner. Thank you, we appreciate your assistance with this!

## 2022-08-26 ENCOUNTER — Other Ambulatory Visit: Payer: Self-pay | Admitting: Family Medicine

## 2022-08-29 ENCOUNTER — Other Ambulatory Visit: Payer: Self-pay | Admitting: Family Medicine

## 2022-08-29 DIAGNOSIS — E118 Type 2 diabetes mellitus with unspecified complications: Secondary | ICD-10-CM

## 2022-09-14 ENCOUNTER — Other Ambulatory Visit: Payer: Self-pay | Admitting: Emergency Medicine

## 2022-09-14 DIAGNOSIS — E662 Morbid (severe) obesity with alveolar hypoventilation: Secondary | ICD-10-CM

## 2022-09-14 DIAGNOSIS — J45909 Unspecified asthma, uncomplicated: Secondary | ICD-10-CM

## 2022-09-19 ENCOUNTER — Telehealth: Payer: Self-pay | Admitting: Family Medicine

## 2022-09-19 NOTE — Telephone Encounter (Signed)
Mother dropped off form at front desk for [name] and Postponements and  [employer].  Verified that patient section of form has been completed.  Last DOS/WCC with PCP was 08/16/22.  Placed form in blue team folder to be completed by clinical staff.  Creig Hines

## 2022-09-19 NOTE — Telephone Encounter (Signed)
Clinical info completed on Jury Duty form.  Placed form in PCP's box for completion.    When form is completed, please route note to "RN Team" and place in wall pocket in front office.   Salvatore Marvel, CMA

## 2022-09-23 ENCOUNTER — Other Ambulatory Visit: Payer: Self-pay | Admitting: Family Medicine

## 2022-09-24 NOTE — Telephone Encounter (Signed)
Patient's mother called, LVM and informed that forms are ready for pick up. Copy made and placed in batch scanning. Original placed at front desk for pick up.   Talbot Grumbling, RN

## 2022-10-03 ENCOUNTER — Telehealth: Payer: Self-pay | Admitting: Family Medicine

## 2022-10-03 NOTE — Telephone Encounter (Signed)
Patient's mother came in stating that she needs a letter excusing him from jury duty. The letter needs to explain his disability and how it affects his ability to serve jury duty. Please call mom when it is ready.

## 2022-10-04 ENCOUNTER — Encounter: Payer: Self-pay | Admitting: Family Medicine

## 2022-10-26 ENCOUNTER — Other Ambulatory Visit: Payer: Self-pay | Admitting: Family Medicine

## 2022-10-28 ENCOUNTER — Other Ambulatory Visit: Payer: Self-pay | Admitting: Family Medicine

## 2022-11-11 ENCOUNTER — Encounter: Payer: Self-pay | Admitting: Family Medicine

## 2022-11-11 ENCOUNTER — Other Ambulatory Visit: Payer: Self-pay

## 2022-11-11 ENCOUNTER — Ambulatory Visit (INDEPENDENT_AMBULATORY_CARE_PROVIDER_SITE_OTHER): Payer: Medicaid Other | Admitting: Family Medicine

## 2022-11-11 VITALS — BP 112/79 | HR 93 | Ht <= 58 in | Wt 248.2 lb

## 2022-11-11 DIAGNOSIS — H1031 Unspecified acute conjunctivitis, right eye: Secondary | ICD-10-CM

## 2022-11-11 MED ORDER — HYPROMELLOSE (GONIOSCOPIC) 2.5 % OP SOLN: 1.0000 [drp] | Freq: Four times a day (QID) | OPHTHALMIC | 12 refills | Status: AC

## 2022-11-11 MED ORDER — OFLOXACIN 0.3 % OP SOLN: OPHTHALMIC | 0 refills | Status: DC

## 2022-11-11 NOTE — Patient Instructions (Signed)
It was wonderful to see you today.  Please bring ALL of your medications with you to every visit.   Today we talked about:  I am sending prescription for artificial eyedrops which she can use 4 times a day. I am also sending antibiotic eyedrops to use every 4 hours to his right eye in the first 2 days.  This can then be spaced every 6 hours for the following 5 days.  Please return, or seek immediate care, if he starts to have vision changes or eye pain.   Thank you for coming to your visit as scheduled. We have had a large "no-show" problem lately, and this significantly limits our ability to see and care for patients. As a friendly reminder- if you cannot make your appointment please call to cancel. We do have a no show policy for those who do not cancel within 24 hours. Our policy is that if you miss or fail to cancel an appointment within 24 hours, 3 times in a 47-month period, you may be dismissed from our clinic.   Thank you for choosing [facility].   Please call [phone] with any questions about today's appointment.  Please be sure to schedule follow up at the front  desk before you leave today.   Sabino Dick, DO PGY-3 Family Medicine

## 2022-11-11 NOTE — Progress Notes (Signed)
eyue

## 2022-11-11 NOTE — Progress Notes (Signed)
    SUBJECTIVE:   CHIEF COMPLAINT / HPI:   [name] is a 27 y.o. male who presents to the [facility] clinic today to discuss the following concerns:   Right Eye Erythematous since last Thursday. Thought it was due to pollen. Was doing anti-itch drops. Started to get better but then returned on Sunday. They had tried to do eye flushes yesterday and today. Yesterday he seemed to be sensitive to light- was asking for the blinds to be closed. That did not last all day. No pain today and no photosensitivity. There has not been much discharge or drainage.   No pain in eye, no double vision or blurred vision.  Does not wear contacts.  No other symptoms such as congestion, runny nose, allergies.     PERTINENT  PMH / PSH: Prader-Willi, hypertension, type 2 diabetes  OBJECTIVE:   BP 112/79   Pulse 93   Ht 4\' 6"  (1.372 m)   Wt 248 lb 3.2 oz (112.6 kg)   SpO2 100%   BMI 59.84 kg/m    General: Obese, in NAD, pleasant, cooperative  HEENT: Normocephalic, right conjunctiva is injected, left conjunctiva is clear. No drainage from right or left eye. EOMI, PERRLA Respiratory: normal effort    ASSESSMENT/PLAN:   1. Acute conjunctivitis of right eye, unspecified acute conjunctivitis type Given risk factors: diabetes, HTN, obesity will ere on side of caution and treat as bacterial conjunctivitis. Also given the fact that his eye seems to have worsened (per family report) with conservative measures. No red flags today. No concern for orbital or preseptal cellulitis. Doesn't sound consistent with allergic conjunctivitis. No pain or visual changes to be concerned for more serious pathologies.  - ofloxacin (OCUFLOX) 0.3 % ophthalmic solution; 0.3% ophthalmic solution, instill 2 drops into the right eye every 4 hours for 2 days, then 2 drops every 6 hours on days 3 through 7  Dispense: 5 mL; Refill: 0 - hydroxypropyl methylcellulose / hypromellose (ISOPTO TEARS / GONIOVISC) 2.5 % ophthalmic solution; Place  1 drop into the right eye 4 (four) times daily.  Dispense: 15 mL; Refill: 12 - Return precautions discussed     Sabino Dick, DO [facility]

## 2022-11-30 ENCOUNTER — Other Ambulatory Visit: Payer: Self-pay | Admitting: Family Medicine

## 2022-12-02 ENCOUNTER — Telehealth: Payer: Self-pay

## 2022-12-02 ENCOUNTER — Other Ambulatory Visit: Payer: Self-pay | Admitting: Family Medicine

## 2022-12-02 DIAGNOSIS — E1169 Type 2 diabetes mellitus with other specified complication: Secondary | ICD-10-CM

## 2022-12-02 MED ORDER — TORSEMIDE 20 MG PO TABS: ORAL_TABLET | ORAL | 2 refills | Status: DC

## 2022-12-02 NOTE — Telephone Encounter (Signed)
Mother calls nurse line requesting additional refills to Torsemide prescription.   Will forward to PCP.

## 2022-12-20 ENCOUNTER — Other Ambulatory Visit: Payer: Self-pay | Admitting: Family Medicine

## 2022-12-20 DIAGNOSIS — H1031 Unspecified acute conjunctivitis, right eye: Secondary | ICD-10-CM

## 2022-12-24 ENCOUNTER — Telehealth: Payer: Self-pay | Admitting: Family Medicine

## 2022-12-24 NOTE — Telephone Encounter (Signed)
Mother dropped off form at front desk for [facility] Hand[employer]ap [employer]ac[employer].  Verified that patient section of form has been completed.  Last DOS/WCC with PCP was 11/21/22.  Placed form in blue team folder to be completed by clinical staff.  Vilinda Blanks

## 2022-12-24 NOTE — Telephone Encounter (Signed)
Placed a handicap form in your box for the patient.

## 2022-12-27 NOTE — Telephone Encounter (Signed)
Patient's mother called and informed that forms are ready for pick up. Copy made and placed in batch scanning. Original placed at front desk for pick up.  ° °Marshon Bangs C Najat Olazabal, RN ° ° °

## 2022-12-31 ENCOUNTER — Other Ambulatory Visit: Payer: Self-pay | Admitting: Family Medicine

## 2022-12-31 DIAGNOSIS — E118 Type 2 diabetes mellitus with unspecified complications: Secondary | ICD-10-CM

## 2023-03-20 ENCOUNTER — Other Ambulatory Visit: Payer: Self-pay

## 2023-03-20 MED ORDER — PANTOPRAZOLE SODIUM 20 MG PO TBEC: DELAYED_RELEASE_TABLET | ORAL | 2 refills | Status: DC

## 2023-03-20 MED ORDER — ACCU-CHEK SOFTCLIX LANCETS MISC: 3 refills | Status: DC

## 2023-03-20 MED ORDER — MONTELUKAST SODIUM 10 MG PO TABS: 10.0000 mg | ORAL_TABLET | Freq: Every day | ORAL | 3 refills | Status: DC

## 2023-03-20 NOTE — Telephone Encounter (Signed)
Received call from pharmacy regarding patient's prescriptions.   Patient has refills at pharmacy, however, they are under Dr. Robyne Peers (no longer enrolled in [facility])  Please send new prescriptions to [facility].   Veronda Prude, RN

## 2023-04-03 ENCOUNTER — Other Ambulatory Visit: Payer: Self-pay

## 2023-04-03 ENCOUNTER — Other Ambulatory Visit: Payer: Self-pay | Admitting: Student

## 2023-04-03 DIAGNOSIS — I10 Essential (primary) hypertension: Secondary | ICD-10-CM

## 2023-04-03 DIAGNOSIS — E1169 Type 2 diabetes mellitus with other specified complication: Secondary | ICD-10-CM

## 2023-04-03 DIAGNOSIS — E118 Type 2 diabetes mellitus with unspecified complications: Secondary | ICD-10-CM

## 2023-04-03 MED ORDER — SEMAGLUTIDE(0.25 OR 0.5MG/DOS) 2 MG/1.5ML ~~LOC~~ SOPN
0.5000 mg | PEN_INJECTOR | SUBCUTANEOUS | 3 refills | Status: DC
Start: 2023-04-03 — End: 2023-08-04

## 2023-04-03 MED ORDER — ACCU-CHEK GUIDE VI STRP: ORAL_STRIP | 1 refills | Status: DC

## 2023-04-03 MED ORDER — TORSEMIDE 20 MG PO TABS
ORAL_TABLET | ORAL | 2 refills | Status: DC
Start: 2023-04-03 — End: 2023-06-25

## 2023-04-17 ENCOUNTER — Encounter: Payer: Self-pay | Admitting: Emergency Medicine

## 2023-04-17 ENCOUNTER — Ambulatory Visit: Payer: MEDICAID | Admitting: Emergency Medicine

## 2023-04-17 VITALS — BP 110/66 | HR 88 | Temp 98.6°F | Ht <= 58 in | Wt 242.6 lb

## 2023-04-17 DIAGNOSIS — Q8711 Prader-Willi syndrome: Secondary | ICD-10-CM

## 2023-04-17 DIAGNOSIS — E662 Morbid (severe) obesity with alveolar hypoventilation: Secondary | ICD-10-CM

## 2023-04-17 DIAGNOSIS — J45909 Unspecified asthma, uncomplicated: Secondary | ICD-10-CM

## 2023-04-17 DIAGNOSIS — J301 Allergic rhinitis due to pollen: Secondary | ICD-10-CM

## 2023-04-17 DIAGNOSIS — E1169 Type 2 diabetes mellitus with other specified complication: Secondary | ICD-10-CM

## 2023-04-17 NOTE — Progress Notes (Signed)
Subjective:    Patient ID: [name], male    DOB: February 24, 1996, 27 y.o.   MRN: [id number]   Asthma He complains of shortness of breath. There is no cough or wheezing. Pertinent negatives include no ear pain, fever, headaches, postnasal drip, rhinorrhea, sneezing, sore throat or trouble swallowing. His past medical history is significant for asthma.    ROV 12/30/19 --follow-up visit for 27 year old gentleman with Prader-Willi syndrome, associated obesity, severe OSA OHS which we have treated with a trilogy ventilator.  No longer needs O2 w exertion.   We speculate that he may have some obstructive lung disease and has been managed with Flovent. No albuterol use. COVID shot up to date - Moderna. He is now back to going to the [facility]. No trouble with his trilogy, gets his equipment reliably. Need to renew his O2 and trilogy to [employer] > per mom.  ROV 12/26/21 --[name] is a very pleasant 27 year old gentleman with history of Prader-Willi syndrome.  He has associated obesity and severe OSA/OHS.  He uses a trilogy ventilator.  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  He is no longer on exertional oxygen. He is more active than last year - has lost significant wt in the last year.  Occasional albuterol use. No flares or hospitalizations.  He has great compliance with his ventilator. He gets benefit - less sleepy, better daytime energy, less HA. They have all equipment, but need documentation from this office visit so he can continue to do so.  His DME is [employer].   Trilogy ventilator compliance report available from 09/12/2021 through 10/11/2021.  Showed that he used it 100% of the time on average 7.3 hours.  His average tidal volume was 417 cc, average minute ventilation 7.1 L/min, average settings 20/6  ROV 04/17/2023 --times a day is here with his mother.  He is 27 with a history of Prader-Willi syndrome, associated obesity and severe OSA/OHS.  He is on a trilogy  ventilator.  He also has possible obstructive lung disease managed on Flovent, not requiring on a schedule at this time.  No longer on home oxygen.  Today he and his mother report that he is doing well. He goes to the [facility] daily. Has lost about 20 lbs since I last saw him. He is wearing the Trilogy, gets it serviced regularly. No flares, no hospitalizations.     Review of Systems  Constitutional:  Negative for fever and unexpected weight change.  HENT:  Negative for congestion, dental problem, ear pain, nosebleeds, postnasal drip, rhinorrhea, sinus pressure, sneezing, sore throat and trouble swallowing.   Eyes:  Negative for redness and itching.  Respiratory:  Positive for shortness of breath. Negative for cough, chest tightness and wheezing.   Cardiovascular:  Negative for palpitations and leg swelling.  Gastrointestinal:  Negative for nausea and vomiting.  Genitourinary:  Negative for dysuria.  Musculoskeletal:  Negative for joint swelling.  Skin:  Negative for rash.  Neurological:  Negative for headaches.  Hematological:  Does not bruise/bleed easily.  Psychiatric/Behavioral:  Negative for dysphoric mood. The patient is not nervous/anxious.        Objective:   Physical Exam Vitals:   04/17/23 1342  BP: 110/66  Pulse: 88  Temp: 98.6 F (37 C)  TempSrc: Oral  SpO2: 99%  Weight: 242 lb 9.6 oz (110 kg)  Height: 4\' 10"  (1.473 m)   Gen: Pleasant, morbidly obese, in no distress, Interacting and answering questions. Comfortable on RA  ENT:  No lesions,  mouth clear,  oropharynx clear, no active  postnasal drip  Neck: No JVD, no stridor  Lungs: clear bilaterally, no wheeze  Cardiovascular: RRR, heart sounds normal, no murmur or gallops, no peripheral edema  Musculoskeletal: No deformities, no cyanosis or clubbing  Neuro: alert, answers simple questions, moves all ext, faces consistent w his hx P-W syndrome.   Skin: Warm, no lesions or rashes     Assessment & Plan:  Obesity  hypoventilation syndrome (HCC) Tolerating trilogy ventilator and benefiting from it.  Great compliance.  He has it serviced regularly and has all of his equipment.  Prader-Willi syndrome and diabetes He has been losing weight with a good exercise regimen, good diet with a lot of help from his mother  Asthma Has been doing well.  Has not required his Flovent, only using occasionally.  Very rare albuterol use  Allergic rhinitis Has fluticasone nasal spray available to use if needed   Levy Pupa, MD, PhD 04/17/2023, 2:18 PM [facility] [phone] or if no answer [phone]

## 2023-04-17 NOTE — Assessment & Plan Note (Signed)
Tolerating trilogy ventilator and benefiting from it.  Great compliance.  He has it serviced regularly and has all of his equipment.

## 2023-04-17 NOTE — Assessment & Plan Note (Signed)
Has fluticasone nasal spray available to use if needed

## 2023-04-17 NOTE — Assessment & Plan Note (Signed)
He has been losing weight with a good exercise regimen, good diet with a lot of help from his mother

## 2023-04-17 NOTE — Patient Instructions (Signed)
We will continue your trilogy ventilator as you have been using it.  Please call if you having problems with the machine, need equipment or for any other issues Congratulations on weight loss.  Keep up the good work Continue your exercise routine at the [facility] Follow Dr. Delton Coombes in 1 year, sooner if you have any problems.

## 2023-04-17 NOTE — Assessment & Plan Note (Signed)
Has been doing well.  Has not required his Flovent, only using occasionally.  Very rare albuterol use

## 2023-05-05 ENCOUNTER — Other Ambulatory Visit: Payer: Self-pay

## 2023-05-06 MED ORDER — METFORMIN HCL ER 500 MG PO TB24: 500.0000 mg | ORAL_TABLET | Freq: Every day | ORAL | 3 refills | Status: DC

## 2023-05-07 ENCOUNTER — Telehealth: Payer: Self-pay

## 2023-05-07 NOTE — Telephone Encounter (Signed)
Patients mother calls nurse line in regards to Ozempic.   She reports the pharmacy stated they did not have any refills.   I called the pharmacy and they stated they do have refills, however the prescription needs to prior authorization.   Will forward to [facility].

## 2023-05-09 NOTE — Telephone Encounter (Signed)
Clinical questions submitted via covermymeds.   Will await a response.

## 2023-05-09 NOTE — Telephone Encounter (Signed)
Pharmacy Patient Advocate Encounter  Received notification from [facility] that Prior Authorization for [facility] has been APPROVED from 05/09/23 to 05/08/24  All drug strengths approved

## 2023-05-09 NOTE — Telephone Encounter (Signed)
Prior authorization submitted in covermymeds.   Clinical questions are being "generated."   Will check back to complete.  Key: [id number]

## 2023-05-13 NOTE — Telephone Encounter (Signed)
Pharmacy updated.  Pharmacy reports the patient has already picked up prescription.

## 2023-06-23 ENCOUNTER — Other Ambulatory Visit: Payer: Self-pay | Admitting: Student

## 2023-06-23 DIAGNOSIS — E1169 Type 2 diabetes mellitus with other specified complication: Secondary | ICD-10-CM

## 2023-07-07 ENCOUNTER — Other Ambulatory Visit: Payer: Self-pay

## 2023-07-07 MED ORDER — CETIRIZINE HCL 10 MG PO TABS: 10.0000 mg | ORAL_TABLET | Freq: Every day | ORAL | 3 refills | Status: AC

## 2023-07-18 ENCOUNTER — Other Ambulatory Visit: Payer: Self-pay

## 2023-07-18 DIAGNOSIS — H1031 Unspecified acute conjunctivitis, right eye: Secondary | ICD-10-CM

## 2023-08-03 ENCOUNTER — Other Ambulatory Visit: Payer: Self-pay | Admitting: Student

## 2023-08-03 DIAGNOSIS — E118 Type 2 diabetes mellitus with unspecified complications: Secondary | ICD-10-CM

## 2023-08-04 ENCOUNTER — Other Ambulatory Visit: Payer: Self-pay | Admitting: Student

## 2023-08-28 ENCOUNTER — Other Ambulatory Visit: Payer: Self-pay | Admitting: Student

## 2023-09-23 ENCOUNTER — Encounter: Payer: Self-pay | Admitting: Family Medicine

## 2023-09-23 ENCOUNTER — Ambulatory Visit (INDEPENDENT_AMBULATORY_CARE_PROVIDER_SITE_OTHER): Payer: MEDICAID | Admitting: Family Medicine

## 2023-09-23 ENCOUNTER — Other Ambulatory Visit: Payer: Self-pay | Admitting: Student

## 2023-09-23 VITALS — BP 111/78 | HR 102 | Temp 98.8°F | Ht <= 58 in | Wt 243.0 lb

## 2023-09-23 DIAGNOSIS — J45909 Unspecified asthma, uncomplicated: Secondary | ICD-10-CM

## 2023-09-23 DIAGNOSIS — E1169 Type 2 diabetes mellitus with other specified complication: Secondary | ICD-10-CM

## 2023-09-23 DIAGNOSIS — R059 Cough, unspecified: Secondary | ICD-10-CM | POA: Diagnosis not present

## 2023-09-23 LAB — POC SOFIA 2 FLU + SARS ANTIGEN FIA
Influenza A, POC: NEGATIVE
Influenza B, POC: NEGATIVE
SARS Coronavirus 2 Ag: NEGATIVE

## 2023-09-23 MED ORDER — ALBUTEROL SULFATE (2.5 MG/3ML) 0.083% IN NEBU: 2.5000 mg | INHALATION_SOLUTION | Freq: Four times a day (QID) | RESPIRATORY_TRACT | 1 refills | Status: AC | PRN

## 2023-09-23 NOTE — Patient Instructions (Addendum)
 It was great to see you! Thank you for allowing me to participate in your care!  Our plans for today:  - You have a viral illness causing your symptoms. - This will get better in the next several days. - You may use Tylenol and Ibuprofen as needed for pain. - Continue your Flonase and Zyrtec - If you do not get better in the next 5 days please return to care. - It is important to stay hydrated, and continue eating while sick. - I have refilled your albuterol nebulizer solution.    Please arrive 15 minutes PRIOR to your next scheduled appointment time! If you do not, this affects OTHER patients' care.  Take care and seek immediate care sooner if you develop any concerns.   Celine Mans, MD, PGY-2 [facility] 2:53 PM 09/23/2023  [facility]

## 2023-09-23 NOTE — Assessment & Plan Note (Addendum)
 Reassuring exam today. Not in acute exacerbation. Continue Flovent daily. 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 Continue Flovent. Return precautions discussed.

## 2023-09-23 NOTE — Progress Notes (Signed)
    SUBJECTIVE:   CHIEF COMPLAINT / HPI: flu like symtpoms  Sore throat started last Thursday Has also had cough and congestion. Here today with sister. No known fevers. Appetite is normal. Drinking the same as normal.  Overall, not worse, but not improved. No Tylenol or Ibuprofen. Nieces are sick, had RSV. Sister also was sick. Is taking Singulair, Flonase, and Zyrtec.  Mother on phone. Has been giving inhaler every 6 hours started yesterday. Nebulizer works way better. Some wheezing. Mom state was breathing slower.  Coricedin cough syrup has helped.  PERTINENT  PMH / PSH: HTN, Prader-Willi syndrome, T2DM  OBJECTIVE:   BP 111/78   Pulse (!) 102   Temp 98.8 F (37.1 C) (Oral)   Ht 4\' 10"  (1.473 m)   Wt 243 lb (110.2 kg)   SpO2 98%   BMI 50.79 kg/m   General: NAD, non toxic HEENT: mild posterior oropharyngeal erythema, MMM, no lymphadenopathy Neuro: A&O Cardiovascular: tachycardic, no murmurs, no peripheral edema Respiratory: normal WOB on RA, CTAB, no wheezes, ronchi or rales Abdomen: soft, NTTP, no rebound or guarding Extremities: Moving all 4 extremities equally   ASSESSMENT/PLAN:   Assessment & Plan Cough, unspecified type Suspect viral URI. No findings consistent with pneumonia. Clinically unlikely to be strep. Will treat supportively. Discussed plan with patient and sister who are agreeable. Strict return precautions discuss. Continue cough syrup, Tylenol as needed.  Severe asthma without complication, unspecified whether persistent Reassuring exam today. Not in acute exacerbation. Continue Flovent daily. 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 Continue Flovent. Return precautions discussed.  Return in about 2 months (around 11/21/2023).  Celine Mans, MD [facility]

## 2023-09-25 ENCOUNTER — Other Ambulatory Visit: Payer: Self-pay | Admitting: Student

## 2023-09-26 ENCOUNTER — Other Ambulatory Visit: Payer: Self-pay

## 2023-09-26 MED ORDER — ALBUTEROL SULFATE HFA 108 (90 BASE) MCG/ACT IN AERS
2.0000 | INHALATION_SPRAY | Freq: Four times a day (QID) | RESPIRATORY_TRACT | 3 refills | Status: AC | PRN
  Filled 2023-09-26: qty 18, 25d supply, fill #0

## 2023-09-29 ENCOUNTER — Emergency Department (HOSPITAL_COMMUNITY): Payer: MEDICAID

## 2023-09-29 ENCOUNTER — Other Ambulatory Visit: Payer: Self-pay

## 2023-09-29 ENCOUNTER — Ambulatory Visit (INDEPENDENT_AMBULATORY_CARE_PROVIDER_SITE_OTHER): Payer: MEDICAID | Admitting: Student

## 2023-09-29 ENCOUNTER — Emergency Department (HOSPITAL_COMMUNITY)
Admission: EM | Admit: 2023-09-29 | Discharge: 2023-09-29 | Disposition: A | Discharge status: Home / Self Care | Payer: MEDICAID | Attending: Emergency Medicine | Admitting: Emergency Medicine

## 2023-09-29 ENCOUNTER — Encounter (HOSPITAL_COMMUNITY): Payer: Self-pay

## 2023-09-29 VITALS — BP 123/80 | HR 118 | Ht <= 58 in | Wt 242.4 lb

## 2023-09-29 DIAGNOSIS — R0602 Shortness of breath: Secondary | ICD-10-CM | POA: Diagnosis not present

## 2023-09-29 DIAGNOSIS — J181 Lobar pneumonia, unspecified organism: Secondary | ICD-10-CM | POA: Insufficient documentation

## 2023-09-29 DIAGNOSIS — Z7985 Long-term (current) use of injectable non-insulin antidiabetic drugs: Secondary | ICD-10-CM | POA: Diagnosis not present

## 2023-09-29 DIAGNOSIS — J45909 Unspecified asthma, uncomplicated: Secondary | ICD-10-CM

## 2023-09-29 DIAGNOSIS — E118 Type 2 diabetes mellitus with unspecified complications: Secondary | ICD-10-CM

## 2023-09-29 DIAGNOSIS — J189 Pneumonia, unspecified organism: Secondary | ICD-10-CM

## 2023-09-29 DIAGNOSIS — R059 Cough, unspecified: Secondary | ICD-10-CM | POA: Diagnosis present

## 2023-09-29 LAB — POCT GLYCOSYLATED HEMOGLOBIN (HGB A1C): HbA1c, POC (controlled diabetic range): 5.6 % (ref 0.0–7.0)

## 2023-09-29 LAB — COMPREHENSIVE METABOLIC PANEL
ALT: 44 U/L (ref 0–44)
AST: 36 U/L (ref 15–41)
Albumin: 3.4 g/dL — ABNORMAL LOW (ref 3.5–5.0)
Alkaline Phosphatase: 142 U/L — ABNORMAL HIGH (ref 38–126)
Anion gap: 14 (ref 5–15)
BUN: 33 mg/dL — ABNORMAL HIGH (ref 6–20)
CO2: 25 mmol/L (ref 22–32)
Calcium: 9.3 mg/dL (ref 8.9–10.3)
Chloride: 101 mmol/L (ref 98–111)
Creatinine, Ser: 0.79 mg/dL (ref 0.61–1.24)
GFR, Estimated: >60 mL/min (ref >60)
Glucose, Bld: 113 mg/dL — ABNORMAL HIGH (ref 70–99)
Potassium: 3.8 mmol/L (ref 3.5–5.1)
Sodium: 140 mmol/L (ref 135–145)
Total Bilirubin: 0.3 mg/dL (ref 0.0–1.2)
Total Protein: 7.8 g/dL (ref 6.5–8.1)

## 2023-09-29 LAB — CBC
HCT: 39.8 % (ref 39.0–52.0)
Hemoglobin: 13.1 g/dL (ref 13.0–17.0)
MCH: 27.2 pg (ref 26.0–34.0)
MCHC: 32.9 g/dL (ref 30.0–36.0)
MCV: 82.7 fL (ref 80.0–100.0)
Platelets: 276 10*3/uL (ref 150–400)
RBC: 4.81 MIL/uL (ref 4.22–5.81)
RDW: 15.4 % (ref 11.5–15.5)
WBC: 8.3 10*3/uL (ref 4.0–10.5)
nRBC: 0 % (ref 0.0–0.2)

## 2023-09-29 LAB — RESP PANEL BY RT-PCR (RSV, FLU A&B, COVID)  RVPGX2
Influenza A by PCR: NEGATIVE
Influenza B by PCR: NEGATIVE
Resp Syncytial Virus by PCR: NEGATIVE
SARS Coronavirus 2 by RT PCR: NEGATIVE

## 2023-09-29 LAB — D-DIMER, QUANTITATIVE: D-Dimer, Quant: 0.36 ug{FEU}/mL (ref 0.00–0.50)

## 2023-09-29 MED ORDER — AZITHROMYCIN 250 MG PO TABS: 250.0000 mg | ORAL_TABLET | Freq: Every day | ORAL | 0 refills | Status: AC

## 2023-09-29 MED ORDER — PREDNISONE 20 MG PO TABS
60.0000 mg | ORAL_TABLET | Freq: Once | ORAL | Status: AC
  Administered 2023-09-29: 60 mg via ORAL
  Filled 2023-09-29: qty 3

## 2023-09-29 MED ORDER — AMOXICILLIN-POT CLAVULANATE 875-125 MG PO TABS
1.0000 | ORAL_TABLET | Freq: Once | ORAL | Status: AC
Start: 2023-09-29 — End: 2023-09-29
  Administered 2023-09-29: 1 via ORAL
  Filled 2023-09-29: qty 1

## 2023-09-29 MED ORDER — AZITHROMYCIN 250 MG PO TABS
1000.0000 mg | ORAL_TABLET | Freq: Once | ORAL | Status: AC
  Administered 2023-09-29: 1000 mg via ORAL
  Filled 2023-09-29: qty 4

## 2023-09-29 MED ORDER — ALBUTEROL SULFATE (2.5 MG/3ML) 0.083% IN NEBU: 2.5000 mg | INHALATION_SOLUTION | Freq: Once | RESPIRATORY_TRACT | Status: AC

## 2023-09-29 MED ORDER — IPRATROPIUM-ALBUTEROL 0.5-2.5 (3) MG/3ML IN SOLN: 3.0000 mL | Freq: Once | RESPIRATORY_TRACT | Status: DC

## 2023-09-29 MED ORDER — AMOXICILLIN-POT CLAVULANATE 875-125 MG PO TABS: 1.0000 | ORAL_TABLET | Freq: Two times a day (BID) | ORAL | 0 refills | Status: AC

## 2023-09-29 MED ORDER — PREDNISONE 20 MG PO TABS: ORAL_TABLET | ORAL | 0 refills | Status: AC

## 2023-09-29 MED ORDER — BENZONATATE 100 MG PO CAPS: 100.0000 mg | ORAL_CAPSULE | Freq: Three times a day (TID) | ORAL | 0 refills | Status: AC

## 2023-09-29 MED ORDER — IPRATROPIUM-ALBUTEROL 0.5-2.5 (3) MG/3ML IN SOLN
2.0000 mL | RESPIRATORY_TRACT | Status: AC
  Filled 2023-09-29: qty 3

## 2023-09-29 NOTE — ED Notes (Signed)
 Cannot get blood at

## 2023-09-29 NOTE — Patient Instructions (Signed)
 It was great to see you today! Thank you for choosing [facility] for your primary care.  Today we addressed: We will give a breathing treatment and then send to the ER for further evaluation   If you haven't already, sign up for My Chart to have easy access to your labs results, and communication with your primary care physician.   Please arrive 15 minutes before your appointment to ensure smooth check in process.  We appreciate your efforts in making this happen.  Thank you for allowing me to participate in your care, Alfredo Martinez, MD 09/29/2023, 9:42 AM PGY-3, [facility]

## 2023-09-29 NOTE — ED Provider Triage Note (Signed)
 Emergency Medicine Provider Triage Evaluation Note  LAMOYNE HESSEL , a 28 y.o. male  was evaluated in triage.  Pt complains of cough and shortness of breath.  Seen at family practice center and concern for possible PE versus asthma exacerbation surgery for evaluation..  Review of Systems  Positive: Chest pain shortness of breath Negative: Leg pain swelling  Physical Exam  BP (!) 143/91   Pulse (!) 122   Temp 99 F (37.2 C) (Oral)   Resp (!) 24   Ht 1.473 m (4\' 10" )   Wt 110 kg   SpO2 100%   BMI 50.68 kg/m  Gen:   Awake, no distress   Resp:  Tachypnea noted MSK:   Moves extremities without difficulty  Other:    Medical Decision Making  Medically screening exam initiated at 11:00 AM.  Appropriate orders placed.  LAKEEM ROZO was informed that the remainder of the evaluation will be completed by another provider, this initial triage assessment does not replace that evaluation, and the importance of remaining in the ED until their evaluation is complete.     Lorre Nick, MD 09/29/23 1101

## 2023-09-29 NOTE — ED Notes (Signed)
Pt ambulated to bathroom with family member

## 2023-09-29 NOTE — ED Triage Notes (Signed)
 Pt sent by PCP for tachycardia. Pt c/o sore throat, dry cough, SOBx1.5wk. Pt's SOB got worse today.

## 2023-09-29 NOTE — Progress Notes (Signed)
    SUBJECTIVE:   CHIEF COMPLAINT / HPI:   Cough  Asthma Follow up:  - Follow up on last visit from 09/23/23. - At that time reported of Flu like symptoms with h/o asthma  - Instructions from last visit: Continue Flovent daily. Albuterol nebulizer solution 2.5mg  every q6h as needed  - Parent reports that the patient awoke with worsening dyspnea despite an albuterol treatment at 0500 - No fever or chills  - No chest pain  - Patient has no leg pain   PERTINENT  PMH / PSH:  HTN - Amlodipine  Prader-Willi syndrome  T2DM - needs A1C OHS  OBJECTIVE:   BP 123/80   Pulse (!) 118   Ht 4\' 10"  (1.473 m)   Wt 242 lb 6.4 oz (110 kg)   SpO2 100%   BMI 50.66 kg/m   General: Alert in mild respiratory distress with increased WOB Heart: Regular rhythm, tachycardia, with no murmurs appreciated Lungs: Coarse throughout, no wheezing or diminishment noted, increased WOB, sitting upright Abdomen: Bowel sounds present, no abdominal pain Skin: Warm and dry Extremities: No lower extremity edema   ASSESSMENT/PLAN:   Assessment & Plan Shortness of breath Acute this AM with tachycardia (last albuterol tx 0500 and should not currently be causing tachycardia) concerns me for possibility of PE. However, cannot rule out asthma exacerbation, PNA, pleural effusion, PTX, cardiac etiology. As I am unable to rule out these concerns, discussed importance of ED visit after leaving clinic. Providing a neb treatment now in office prior to going to ED and will update inpatient team. Need imaging and labwork, especially to rule out PE, cardiac cause faster than able to do outpatient. Stable for parent to take to ER from here as he has normal oxygen saturation, do not go home first. Follow up after ED visit. Risks associated with deferring ED assessment discussed with family in the room.      Alfredo Martinez, MD [facility]

## 2023-09-29 NOTE — ED Provider Notes (Signed)
 [place] EMERGENCY DEPARTMENT AT [facility] Provider Note   CSN: [id number] Arrival date & time: 09/29/23  1021     History  No chief complaint on file.   [name] is a 28 y.o. male.  28 yo M with a chief complaints of cough congestion difficulty breathing.  Going on for about a week and a half now.  Was seen at his family doctor's office and there was some concern of work breathing and was sent here for evaluation.  Patient's mom provides most of the history.  States this is not uncommon for him when he is sick.  She went to get ahead of it and started on some sort of therapy.        Home Medications Prior to Admission medications   Medication Sig Start Date End Date Taking? Authorizing Provider  amoxicillin-clavulanate (AUGMENTIN) 875-125 MG tablet Take 1 tablet by mouth every 12 (twelve) hours. 09/29/23  Yes Melene Plan, DO  azithromycin (ZITHROMAX) 250 MG tablet Take 1 tablet (250 mg total) by mouth daily for 4 days. 09/29/23 10/03/23 Yes Melene Plan, DO  benzonatate (TESSALON) 100 MG capsule Take 1 capsule (100 mg total) by mouth every 8 (eight) hours. 09/29/23  Yes Melene Plan, DO  predniSONE (DELTASONE) 20 MG tablet 2 tabs po daily x 4 days 09/29/23  Yes Melene Plan, DO  Accu-Chek Softclix Lancets lancets CHECK BLOOD SUGAR ONCE DAILY 03/20/23   Tiffany Kocher, DO  albuterol (PROVENTIL) (2.5 MG/3ML) 0.083% nebulizer solution Take 3 mLs (2.5 mg total) by nebulization every 6 (six) hours as needed for wheezing or shortness of breath. 09/23/23   Celine Mans, MD  albuterol (VENTOLIN HFA) 108 (90 Base) MCG/ACT inhaler Inhale 2 puffs into the lungs every 6 (six) hours as needed for wheezing or shortness of breath. 09/26/23   Tiffany Kocher, DO  allopurinol (ZYLOPRIM) 300 MG tablet TAKE 1 TABLET(300 MG) BY MOUTH DAILY 08/04/23   Tiffany Kocher, DO  amLODipine (NORVASC) 10 MG tablet TAKE 1 TABLET(10 MG) BY MOUTH DAILY 04/03/23   Tiffany Kocher, DO  Blood Glucose  Monitoring Suppl (ACCU-CHEK GUIDE) w/Device KIT CHECK BLOOD SUGAR ONCE DAILY 01/01/23   Ganta, Anupa, DO  calcium carbonate (TUMS - DOSED IN MG ELEMENTAL CALCIUM) 500 MG chewable tablet Chew 2 tablets (400 mg of elemental calcium total) by mouth 3 (three) times daily between meals as needed for indigestion or heartburn. 06/10/18   Shirley, [place], DO  cetirizine (ZYRTEC) 10 MG tablet Take 1 tablet (10 mg total) by mouth daily. 07/07/23   Tiffany Kocher, DO  desmopressin (DDAVP) 0.2 MG tablet TAKE 2 TABLETS BY MOUTH AT BEDTIME 08/29/23   Tiffany Kocher, DO  FLOVENT HFA 110 MCG/ACT inhaler INHALE 2 PUFFS INTO THE LUNGS TWICE DAILY 09/15/22   Leslye Peer, MD  fluticasone (FLONASE) 50 MCG/ACT nasal spray Place 1 spray into both nostrils daily. 06/10/18   Shirley, [place], DO  glucose blood (ACCU-CHEK GUIDE) test strip CHECK BLOOD SUGAR ONCE DAILY 04/03/23   Tiffany Kocher, DO  hydroxypropyl methylcellulose / hypromellose (ISOPTO TEARS / GONIOVISC) 2.5 % ophthalmic solution Place 1 drop into the right eye 4 (four) times daily. 11/11/22   Sabino Dick, DO  Lancets 30G MISC 1 Device by Does not apply route daily before breakfast. 10/06/14   Glori Luis, MD  metFORMIN (GLUCOPHAGE-XR) 500 MG 24 hr tablet Take 1 tablet (500 mg total) by mouth daily with breakfast. 05/06/23   Tiffany Kocher, DO  montelukast (SINGULAIR) 10 MG  tablet Take 1 tablet (10 mg total) by mouth at bedtime. 03/20/23   Tiffany Kocher, DO  nystatin (MYCOSTATIN/NYSTOP) powder Apply 1 application topically 3 (three) times daily. 03/14/21   Ganta, Anupa, DO  ofloxacin (OCUFLOX) 0.3 % ophthalmic solution PLACE 2 DROPS INTO THE RIGHT EYE EVERY 4 HOURS FOR 2 DAYS AND THEN 2 DROPS EVERY 6 HOURS ON DAYS 3 TO 7 12/20/22   Ganta, Anupa, DO  oxybutynin (DITROPAN) 5 MG tablet TAKE 2 TABLETS BY MOUTH AT BEDTIME 08/29/23   Tiffany Kocher, DO  pantoprazole (PROTONIX) 20 MG tablet TAKE 1 TABLET BY MOUTH DAILY 03/20/23   Tiffany Kocher, DO  PREVIDENT  5000 ENAMEL PROTECT 1.1-5 % PSTE Take 1 application by mouth 2 (two) times daily.  01/14/17   [provider]  Respiratory Therapy Supplies (NEBULIZER/ADULT MASK) KIT 1 Device by Does not apply route once. 05/20/14   Glori Luis, MD  Respiratory Therapy Supplies MISC BiPAP IPAP:20, EPAP: 8, back up respiratory rate: 10, FIO2: 35%. 04/30/17   Shon Hale, MD  Semaglutide,0.25 or 0.5MG /DOS, (OZEMPIC, 0.25 OR 0.5 MG/DOSE,) 2 MG/3ML SOPN INJECT 0.5MG  UNDER THE SKIN ONCE A WEEK 08/04/23   Tiffany Kocher, DO  SODIUM FLUORIDE, DENTAL RINSE, 0.2 % SOLN Take by mouth as directed. 04/03/23   [provider]  Spacer/Aero-Holding Chambers (AEROCHAMBER MV) inhaler Use with albuterol inhaler 04/20/12   Madolyn Frieze, Etta Quill, MD  torsemide (DEMADEX) 20 MG tablet TAKE 2 TABLETS(40 MG) BY MOUTH TWICE DAILY 09/26/23   Tiffany Kocher, DO      Allergies    Patient has no known allergies.    Review of Systems   Review of Systems  Physical Exam Updated Vital Signs BP (!) 137/99   Pulse 100   Temp 99 F (37.2 C) (Oral)   Resp 12   Ht 4\' 10"  (1.473 m)   Wt 110 kg   SpO2 100%   BMI 50.68 kg/m  Physical Exam Vitals and nursing note reviewed.  Constitutional:      Appearance: He is well-developed.  HENT:     Head: Normocephalic and atraumatic.  Eyes:     Pupils: Pupils are equal, round, and reactive to light.  Neck:     Vascular: No JVD.  Cardiovascular:     Rate and Rhythm: Normal rate and regular rhythm.     Heart sounds: No murmur heard.    No friction rub. No gallop.  Pulmonary:     Effort: No respiratory distress.     Breath sounds: No wheezing.     Comments: Coarse breath sounds in all fields Abdominal:     General: There is no distension.     Tenderness: There is no abdominal tenderness. There is no guarding or rebound.  Musculoskeletal:        General: Normal range of motion.     Cervical back: Normal range of motion and neck supple.  Skin:    Coloration:  Skin is not pale.     Findings: No rash.  Neurological:     Mental Status: He is alert and oriented to person, place, and time.  Psychiatric:        Behavior: Behavior normal.     ED Results / Procedures / Treatments   Labs (all labs ordered are listed, but only abnormal results are displayed) Labs Reviewed  COMPREHENSIVE METABOLIC PANEL - Abnormal; Notable for the following components:      Result Value   Glucose, Bld 113 (*)  BUN 33 (*)    Albumin 3.4 (*)    Alkaline Phosphatase 142 (*)    All other components within normal limits  RESP PANEL BY RT-PCR (RSV, FLU A&B, COVID)  [id number]  CBC  D-DIMER, QUANTITATIVE    EKG EKG Interpretation Date/Time:  Monday September 29 2023 10:49:56 EST Ventricular Rate:  126 PR Interval:  152 QRS Duration:  82 QT Interval:  294 QTC Calculation: 425 R Axis:   14  Text Interpretation: Sinus tachycardia Possible Anterior infarct , age undetermined Abnormal ECG When compared with ECG of 26-Apr-2018 07:20, PREVIOUS ECG IS PRESENT Confirmed by Lorre Nick ([id number]) on 09/29/2023 10:57:38 AM  Radiology DG Chest 1 View Result Date: 09/29/2023 CLINICAL DATA:  Shortness of breath.  Cough. EXAM: CHEST  1 VIEW COMPARISON:  Chest radiograph dated 05/18/2018. FINDINGS: Stable cardiomegaly. Mediastinal contours are within normal limits. Slightly increased density at the right lung base. The left lung appears clear. No sizable pleural effusion or pneumothorax. No acute osseous abnormality. IMPRESSION: Slightly increased density at the right lung base could reflect an infectious/inflammatory etiology or overlying soft tissue. Electronically Signed   By: Hart Robinsons M.D.   On: 09/29/2023 12:25    Procedures Procedures    Medications Ordered in ED Medications  ipratropium-albuterol (DUONEB) 0.5-2.5 (3) MG/3ML nebulizer solution 2 mL (has no administration in time range)  predniSONE (DELTASONE) tablet 60 mg (has no administration in time range)   amoxicillin-clavulanate (AUGMENTIN) 875-125 MG per tablet 1 tablet (has no administration in time range)  azithromycin (ZITHROMAX) tablet 1,000 mg (has no administration in time range)    ED Course/ Medical Decision Making/ A&P                                 Medical Decision Making Amount and/or Complexity of Data Reviewed Labs: ordered. Radiology: ordered.  Risk Prescription drug management.   28 yo M with a significant past medical history of Prader-Willi syndrome comes in with a chief complaints of cough and congestion.  Going on for about a week and a half.  Went to the family doctor's office today and they were concerned for work of breathing and tachycardia and was sent here for evaluation.  I reviewed the patient's chart.  Reviewed their PCPs notes.  Sounds like they were concerned about the possibility of PE, this is less likely if the patient has clinically had what sounds like an upper respiratory illness and has had persistent and worsening symptoms.  Chest x-ray concerning for pneumonia on my independent interpretation.  I discussed this with mom.  She is okay with not obtaining CT imaging at this time.  Of note the D-dimer was also negative.  CMP without significant electrolyte abnormalities.  No acute anemia.  COVID flu and RSV are negative.   Tachycardia has also resolved here in the ED.  Mom states the patient is doing much better.  Seems much more back to baseline.  Burst dose of steroids.  Antibiotics.  PCP follow-up.  3:30 PM:  I have discussed the diagnosis/risks/treatment options with the patient and family.  Evaluation and diagnostic testing in the emergency department does not suggest an emergent condition requiring admission or immediate intervention beyond what has been performed at this time.  They will follow up with PCP. We also discussed returning to the ED immediately if new or worsening sx occur. We discussed the sx which are most concerning (e.g., sudden  worsening pain, fever, inability to tolerate by mouth, worsening difficulty breathing) that necessitate immediate return. Medications administered to the patient during their visit and any new prescriptions provided to the patient are listed below.  Medications given during this visit Medications  ipratropium-albuterol (DUONEB) 0.5-2.5 (3) MG/3ML nebulizer solution 2 mL (has no administration in time range)  predniSONE (DELTASONE) tablet 60 mg (has no administration in time range)  amoxicillin-clavulanate (AUGMENTIN) 875-125 MG per tablet 1 tablet (has no administration in time range)  azithromycin (ZITHROMAX) tablet 1,000 mg (has no administration in time range)     The patient appears reasonably screen and/or stabilized for discharge and I doubt any other medical condition or other [facility] requiring further screening, evaluation, or treatment in the ED at this time prior to discharge.          Final Clinical Impression(s) / ED Diagnoses Final diagnoses:  Pneumonia of right lower lobe due to infectious organism    Rx / DC Orders ED Discharge Orders          Ordered    amoxicillin-clavulanate (AUGMENTIN) 875-125 MG tablet  Every 12 hours        09/29/23 1524    azithromycin (ZITHROMAX) 250 MG tablet  Daily        09/29/23 1524    predniSONE (DELTASONE) 20 MG tablet        09/29/23 1524    benzonatate (TESSALON) 100 MG capsule  Every 8 hours        09/29/23 1528              Melene Plan, DO 09/29/23 1530

## 2023-09-30 ENCOUNTER — Encounter: Payer: Self-pay | Admitting: Student

## 2023-10-01 ENCOUNTER — Encounter: Payer: Self-pay | Admitting: Student

## 2023-10-01 ENCOUNTER — Other Ambulatory Visit: Payer: Self-pay

## 2023-10-02 ENCOUNTER — Other Ambulatory Visit: Payer: Self-pay | Admitting: Student

## 2023-10-02 DIAGNOSIS — E118 Type 2 diabetes mellitus with unspecified complications: Secondary | ICD-10-CM

## 2023-10-03 ENCOUNTER — Other Ambulatory Visit: Payer: Self-pay | Admitting: Student

## 2023-10-03 DIAGNOSIS — J45909 Unspecified asthma, uncomplicated: Secondary | ICD-10-CM

## 2023-10-03 NOTE — Telephone Encounter (Signed)
 Community message sent to [employer] for nebulizer machine. Will await response.   Veronda Prude, RN

## 2023-10-03 NOTE — Progress Notes (Signed)
 This is a patient with Building surveyor syndrome, morbid obesity, severe asthma, and intellectual disability requiring legal guardian.  Medication is better provided by nebulizer rather than inhaler.  Unfortunately, legal guardian reports that patient's nebulizer machine is not working correctly.  Will send for new machine.

## 2023-10-05 NOTE — Telephone Encounter (Signed)
 ADDITION TO PREVIOUS OFFICE VISIT:   Patient is in need of a new nebulizer machine. The previous machine he had has been used for years and no longer works properly.   Zamyah Wiesman Geophysical data processor

## 2023-10-06 NOTE — Addendum Note (Signed)
 Addended by: Veronda Prude on: 10/06/2023 03:33 PM   Modules accepted: Orders

## 2023-10-07 NOTE — Progress Notes (Signed)
 Has the order for the Nebulizer been completed? Do I need to do anything?

## 2023-10-07 NOTE — Addendum Note (Signed)
 Addended by: Veronda Prude on: 10/07/2023 09:19 AM   Modules accepted: Orders

## 2023-10-07 NOTE — Progress Notes (Signed)
 Community message sent to [employer]. Will await response.   Veronda Prude, RN

## 2023-12-02 ENCOUNTER — Other Ambulatory Visit: Payer: Self-pay | Admitting: Student

## 2023-12-02 DIAGNOSIS — E118 Type 2 diabetes mellitus with unspecified complications: Secondary | ICD-10-CM

## 2023-12-13 ENCOUNTER — Other Ambulatory Visit: Payer: Self-pay | Admitting: Student

## 2023-12-13 DIAGNOSIS — E1169 Type 2 diabetes mellitus with other specified complication: Secondary | ICD-10-CM

## 2024-02-02 ENCOUNTER — Other Ambulatory Visit: Payer: Self-pay | Admitting: Student

## 2024-02-02 DIAGNOSIS — E118 Type 2 diabetes mellitus with unspecified complications: Secondary | ICD-10-CM

## 2024-03-05 ENCOUNTER — Other Ambulatory Visit: Payer: Self-pay | Admitting: Student

## 2024-03-05 DIAGNOSIS — I10 Essential (primary) hypertension: Secondary | ICD-10-CM

## 2024-03-05 DIAGNOSIS — E1169 Type 2 diabetes mellitus with other specified complication: Secondary | ICD-10-CM

## 2024-03-16 ENCOUNTER — Other Ambulatory Visit: Payer: Self-pay

## 2024-03-16 MED ORDER — PANTOPRAZOLE SODIUM 20 MG PO TBEC: DELAYED_RELEASE_TABLET | ORAL | 2 refills | Status: AC

## 2024-03-29 ENCOUNTER — Other Ambulatory Visit: Payer: Self-pay | Admitting: Student

## 2024-03-29 DIAGNOSIS — E118 Type 2 diabetes mellitus with unspecified complications: Secondary | ICD-10-CM

## 2024-03-30 ENCOUNTER — Other Ambulatory Visit: Payer: Self-pay | Admitting: Student

## 2024-04-08 ENCOUNTER — Other Ambulatory Visit: Payer: Self-pay

## 2024-04-08 MED ORDER — FLUTICASONE PROPIONATE 50 MCG/ACT NA SUSP: 1.0000 | Freq: Every day | NASAL | 0 refills | Status: DC

## 2024-04-14 ENCOUNTER — Other Ambulatory Visit: Payer: Self-pay | Admitting: Student

## 2024-04-19 ENCOUNTER — Other Ambulatory Visit: Payer: Self-pay | Admitting: Student

## 2024-04-27 ENCOUNTER — Telehealth: Payer: Self-pay

## 2024-04-27 NOTE — Telephone Encounter (Signed)
 Prior authorization submitted for OZEMPIC  0.5MG  to [employer] via Latent.  Key: [id number]  Will most likely need updated chart notes

## 2024-04-27 NOTE — Telephone Encounter (Signed)
 Rec'd PA for Ozempic  0.5mg  pens.   Awaiting clinical questions  Key [id number]

## 2024-04-28 NOTE — Telephone Encounter (Signed)
 Pharmacy Patient Advocate Encounter  Received notification from [employer] [place] MEDICAID that Prior Authorization for OZEMPIC  has been DENIED.  Full denial letter will be uploaded to the media tab. See denial reason below.    Patient will need an appt  PA #/Case ID/Reference #: [id number]

## 2024-04-29 NOTE — Telephone Encounter (Signed)
 Scheduled patient an appt on 10/7 @250 

## 2024-05-11 ENCOUNTER — Other Ambulatory Visit: Payer: Self-pay | Admitting: Student

## 2024-05-11 ENCOUNTER — Ambulatory Visit (INDEPENDENT_AMBULATORY_CARE_PROVIDER_SITE_OTHER): Payer: MEDICAID | Admitting: Student

## 2024-05-11 ENCOUNTER — Encounter: Payer: Self-pay | Admitting: Student

## 2024-05-11 VITALS — BP 131/79 | HR 103 | Ht <= 58 in | Wt 254.4 lb

## 2024-05-11 DIAGNOSIS — I10 Essential (primary) hypertension: Secondary | ICD-10-CM | POA: Diagnosis not present

## 2024-05-11 DIAGNOSIS — E118 Type 2 diabetes mellitus with unspecified complications: Secondary | ICD-10-CM

## 2024-05-11 DIAGNOSIS — E1169 Type 2 diabetes mellitus with other specified complication: Secondary | ICD-10-CM

## 2024-05-11 DIAGNOSIS — N3944 Nocturnal enuresis: Secondary | ICD-10-CM

## 2024-05-11 DIAGNOSIS — Q8711 Prader-Willi syndrome: Secondary | ICD-10-CM

## 2024-05-11 LAB — POCT GLYCOSYLATED HEMOGLOBIN (HGB A1C): HbA1c, POC (controlled diabetic range): 5.9 % (ref 0.0–7.0)

## 2024-05-11 MED ORDER — SEMAGLUTIDE (1 MG/DOSE) 4 MG/3ML ~~LOC~~ SOPN: 1.0000 mg | PEN_INJECTOR | SUBCUTANEOUS | 1 refills | Status: DC

## 2024-05-11 NOTE — Assessment & Plan Note (Signed)
 Prader-Willi syndrome with associated morbid obesity and intellectual disability. Appetite control remains a challenge, with reports of food stealing and the need to lock the kitchen. Current management includes Ozempic  and metformin  for diabetes and appetite control - UACR today - A1c remains well controlled 5.9 on ozempic  and metformin  - Increase Ozempic  to 1 mg for better appetite control. - Reports recent eye exam, they will send records

## 2024-05-11 NOTE — Progress Notes (Signed)
    SUBJECTIVE:   CHIEF COMPLAINT / HPI:   Discussed the use of AI scribe software for clinical note transcription with the patient, who gave verbal consent to proceed.  History of Present Illness [name] Kalee' is a 28 year old male with Prader-Willi syndrome and diabetes who presents for medication management and symptom control. He is accompanied by his mother.  Hyperglycemia and appetite dysregulation - Diabetes managed with Ozempic  0.5 mg and metformin  - Hemoglobin A1c of 5.6 in February - Ozempic  provides additional benefit in weight and appetite control - Persistent hyperphagia requiring kitchen locks to limit food access  Nocturnal enuresis - Desmopressin  used for management - Improvement in nocturnal enuresis symptoms  Physical activity - Engages in regular walking and swimming as part of daily routine   OBJECTIVE:   BP 131/79   Pulse (!) 103   Ht 4' 10 (1.473 m)   Wt 254 lb 6 oz (115.4 kg)   SpO2 100%   BMI 53.16 kg/m    General: NAD, pleasant Cardio: RRR, no MRG. Respiratory: CTAB, normal wob on RA GI: Abdomen is soft, not tender, not distended. BS present Skin: Warm and dry  ASSESSMENT/PLAN:   Assessment & Plan Type 2 diabetes mellitus with complication, without long-term current use of insulin  (HCC) Prader-Willi syndrome and diabetes (HCC) Prader-Willi syndrome with associated morbid obesity and intellectual disability. Appetite control remains a challenge, with reports of food stealing and the need to lock the kitchen. Current management includes Ozempic  and metformin  for diabetes and appetite control - UACR today - A1c remains well controlled 5.9 on ozempic  and metformin  - Increase Ozempic  to 1 mg for better appetite control. - Reports recent eye exam, they will send records SYMPTOM, ENURESIS, NOCTURNAL Primary nocturnal enuresis managed with desmopressin , which has shown improvement in symptoms. - Refill desmopressin   prescription. Essential hypertension, benign Hypertension is well-controlled with current medication regimen.  Slight tachycardia that was resolving with rest, asymptomatic.    Gladis Church, DO [facility]

## 2024-05-11 NOTE — Patient Instructions (Signed)
 It was great to see you! Thank you for allowing me to participate in your care!   I recommend that you always bring your medications to each appointment as this makes it easy to ensure we are on the correct medications and helps us  not miss when refills are needed.  Our plans for today:  - Take 1 mg of Ozempic  weekly, continue your other medications - I recommend follow-up after the new year for annual exam - We are checking some labs today, I will call you if they are abnormal will send you a MyChart message or a letter if they are normal.  If you do not hear about your labs in the next 2 weeks please let us  know.  Take care and seek immediate care sooner if you develop any concerns. Please remember to show up 15 minutes before your scheduled appointment time!  Gladis Church, DO [facility]

## 2024-05-11 NOTE — Assessment & Plan Note (Signed)
 Primary nocturnal enuresis managed with desmopressin , which has shown improvement in symptoms. - Refill desmopressin  prescription.

## 2024-05-11 NOTE — Assessment & Plan Note (Signed)
 Hypertension is well-controlled with current medication regimen.  Slight tachycardia that was resolving with rest, asymptomatic.

## 2024-05-12 LAB — MICROALBUMIN / CREATININE URINE RATIO
Creatinine, Urine: 70.8 mg/dL
Microalb/Creat Ratio: 1200 mg/g{creat} — ABNORMAL HIGH (ref 0–29)
Microalbumin, Urine: 849.7 ug/mL

## 2024-05-13 ENCOUNTER — Telehealth: Payer: Self-pay

## 2024-05-13 NOTE — Telephone Encounter (Signed)
 Prior authorization submitted for OZEMPIC  1MG  to [employer] via Lat[employer].   Key: [id number]

## 2024-05-14 ENCOUNTER — Other Ambulatory Visit (HOSPITAL_COMMUNITY): Payer: Self-pay

## 2024-05-14 NOTE — Telephone Encounter (Signed)
 Pharmacy Patient Advocate Encounter  Received notification from [facility] MEDICAID that Prior Authorization for OZEMPIC  1MG  DOSE PENS has been APPROVED from 05/14/24 to 05/14/25

## 2024-05-18 ENCOUNTER — Ambulatory Visit: Payer: Self-pay | Admitting: Family Medicine

## 2024-05-18 DIAGNOSIS — I10 Essential (primary) hypertension: Secondary | ICD-10-CM

## 2024-05-18 MED ORDER — AMLODIPINE-OLMESARTAN 5-20 MG PO TABS: 1.0000 | ORAL_TABLET | Freq: Every day | ORAL | 0 refills | Status: DC

## 2024-05-18 NOTE — Progress Notes (Signed)
 Noted persistent proteinuria with a microalbumin/creatinine ratio of 1200.  He is currently on amlodipine  10 mg daily with good blood pressure control. We will switch therapy to amlodipine -olmesartan 5-20 mg daily to hopefully maintain blood pressure control as well as give some renal protective effects.  I have sent a MyChart message informing patient he will need to come in in 1 week for repeat BMP check.

## 2024-05-23 NOTE — Progress Notes (Signed)
 Contacted patient's mother given unread MyChart message.  Explained below message.  They will return in 1 week for BP and BMP recheck after medication change.  All questions answered.

## 2024-05-24 ENCOUNTER — Encounter: Payer: Self-pay | Admitting: Student

## 2024-05-25 ENCOUNTER — Other Ambulatory Visit: Payer: Self-pay | Admitting: Student

## 2024-05-25 MED ORDER — AMLODIPINE-OLMESARTAN 5-20 MG PO TABS: 1.0000 | ORAL_TABLET | Freq: Every day | ORAL | 0 refills | Status: DC

## 2024-05-25 NOTE — Telephone Encounter (Signed)
Will forward to patients PCP

## 2024-06-09 ENCOUNTER — Other Ambulatory Visit: Payer: Self-pay | Admitting: Student

## 2024-06-09 DIAGNOSIS — E1169 Type 2 diabetes mellitus with other specified complication: Secondary | ICD-10-CM

## 2024-06-16 ENCOUNTER — Other Ambulatory Visit: Payer: MEDICAID

## 2024-06-16 DIAGNOSIS — I10 Essential (primary) hypertension: Secondary | ICD-10-CM

## 2024-06-17 ENCOUNTER — Encounter: Payer: Self-pay | Admitting: Family Medicine

## 2024-06-17 ENCOUNTER — Ambulatory Visit: Payer: Self-pay | Admitting: Family Medicine

## 2024-06-17 ENCOUNTER — Other Ambulatory Visit: Payer: MEDICAID

## 2024-06-17 ENCOUNTER — Emergency Department (HOSPITAL_COMMUNITY): Payer: MEDICAID

## 2024-06-17 ENCOUNTER — Other Ambulatory Visit: Payer: Self-pay

## 2024-06-17 ENCOUNTER — Emergency Department (HOSPITAL_COMMUNITY)
Admission: EM | Admit: 2024-06-17 | Discharge: 2024-06-18 | Disposition: A | Discharge status: Home / Self Care | Payer: MEDICAID | Source: Ambulatory Visit | Attending: Emergency Medicine | Admitting: Emergency Medicine

## 2024-06-17 DIAGNOSIS — I10 Essential (primary) hypertension: Secondary | ICD-10-CM | POA: Insufficient documentation

## 2024-06-17 DIAGNOSIS — R6250 Unspecified lack of expected normal physiological development in childhood: Secondary | ICD-10-CM | POA: Insufficient documentation

## 2024-06-17 DIAGNOSIS — R944 Abnormal results of kidney function studies: Secondary | ICD-10-CM | POA: Diagnosis not present

## 2024-06-17 DIAGNOSIS — R799 Abnormal finding of blood chemistry, unspecified: Secondary | ICD-10-CM | POA: Diagnosis present

## 2024-06-17 DIAGNOSIS — E860 Dehydration: Secondary | ICD-10-CM | POA: Insufficient documentation

## 2024-06-17 DIAGNOSIS — E119 Type 2 diabetes mellitus without complications: Secondary | ICD-10-CM | POA: Diagnosis not present

## 2024-06-17 DIAGNOSIS — J45909 Unspecified asthma, uncomplicated: Secondary | ICD-10-CM | POA: Insufficient documentation

## 2024-06-17 DIAGNOSIS — R000 Tachycardia, unspecified: Secondary | ICD-10-CM | POA: Insufficient documentation

## 2024-06-17 DIAGNOSIS — N179 Acute kidney failure, unspecified: Secondary | ICD-10-CM | POA: Insufficient documentation

## 2024-06-17 LAB — CBC WITH DIFFERENTIAL/PLATELET
Abs Immature Granulocytes: 0.05 K/uL (ref 0.00–0.07)
Basophils Absolute: 0 x10E3/uL (ref 0.0–0.2)
Basophils Absolute: 0 K/uL (ref 0.0–0.1)
Basophils Relative: 0 %
Basos: 0 %
EOS (ABSOLUTE): 0.8 x10E3/uL — ABNORMAL HIGH (ref 0.0–0.4)
Eos: 9 %
Eosinophils Absolute: 0.8 K/uL — ABNORMAL HIGH (ref 0.0–0.5)
Eosinophils Relative: 9 %
HCT: 39 % (ref 39.0–52.0)
Hematocrit: 37 % — ABNORMAL LOW (ref 37.5–51.0)
Hemoglobin: 12.2 g/dL — ABNORMAL LOW (ref 13.0–17.7)
Hemoglobin: 12.9 g/dL — ABNORMAL LOW (ref 13.0–17.0)
Immature Granulocytes: 1 %
Lymphocytes Absolute: 2.4 x10E3/uL (ref 0.7–3.1)
Lymphocytes Relative: 22 %
Lymphs Abs: 2 K/uL (ref 0.7–4.0)
Lymphs: 26 %
MCH: 27.5 pg (ref 26.6–33.0)
MCH: 27.4 pg (ref 26.0–34.0)
MCHC: 33 g/dL (ref 31.5–35.7)
MCHC: 33.1 g/dL (ref 30.0–36.0)
MCV: 83 fL (ref 79–97)
MCV: 83 fL (ref 80.0–100.0)
Monocytes Absolute: 0.8 x10E3/uL (ref 0.1–0.9)
Monocytes Absolute: 0.6 K/uL (ref 0.1–1.0)
Monocytes Relative: 7 %
Monocytes: 9 %
Neutro Abs: 5.5 K/uL (ref 1.7–7.7)
Neutrophils Absolute: 5.2 x10E3/uL (ref 1.4–7.0)
Neutrophils Relative %: 61 %
Neutrophils: 56 %
Platelets: 229 x10E3/uL (ref 150–450)
Platelets: 214 K/uL (ref 150–400)
RBC: 4.44 x10E6/uL (ref 4.14–5.80)
RBC: 4.7 MIL/uL (ref 4.22–5.81)
RDW: 16.8 % — ABNORMAL HIGH (ref 11.6–15.4)
RDW: 15.2 % (ref 11.5–15.5)
WBC: 9.1 x10E3/uL (ref 3.4–10.8)
WBC: 9 K/uL (ref 4.0–10.5)
nRBC: 0 % (ref 0.0–0.2)

## 2024-06-17 LAB — COMPREHENSIVE METABOLIC PANEL WITH GFR
ALT: 27 IU/L (ref 0–50)
ALT: 25 U/L (ref 0–44)
AST: 20 IU/L (ref 15–59)
AST: 22 U/L (ref 15–41)
Albumin: 4.5 g/dL (ref 4.3–5.2)
Albumin: 4.2 g/dL (ref 3.5–5.0)
Alkaline Phosphatase: 210 IU/L — ABNORMAL HIGH (ref 47–123)
Alkaline Phosphatase: 196 U/L — ABNORMAL HIGH (ref 38–126)
Anion gap: 13 (ref 5–15)
BUN/Creatinine Ratio: 54 — ABNORMAL HIGH (ref 9–20)
BUN: 98 mg/dL (ref 6–20)
BUN: 88 mg/dL — ABNORMAL HIGH (ref 6–20)
Bilirubin Total: 0.5 mg/dL (ref 0.0–1.2)
CO2: 28 mmol/L (ref 20–29)
CO2: 26 mmol/L (ref 22–32)
Calcium: 9.6 mg/dL (ref 8.7–10.2)
Calcium: 9.7 mg/dL (ref 8.9–10.3)
Chloride: 95 mmol/L — ABNORMAL LOW (ref 96–106)
Chloride: 96 mmol/L — ABNORMAL LOW (ref 98–111)
Creatinine, Ser: 1.8 mg/dL — ABNORMAL HIGH (ref 0.76–1.27)
Creatinine, Ser: 1.4 mg/dL — ABNORMAL HIGH (ref 0.61–1.24)
GFR, Estimated: >60 mL/min (ref >60)
Globulin, Total: 3.2 g/dL (ref 1.5–4.5)
Glucose, Bld: 153 mg/dL — ABNORMAL HIGH (ref 70–99)
Glucose: 86 mg/dL (ref 70–99)
Potassium: 3.7 mmol/L (ref 3.5–5.2)
Potassium: 4.1 mmol/L (ref 3.5–5.1)
Sodium: 135 mmol/L (ref 134–144)
Sodium: 135 mmol/L (ref 135–145)
Total Bilirubin: 0.5 mg/dL (ref 0.0–1.2)
Total Protein: 7.7 g/dL (ref 6.0–8.5)
Total Protein: 8.5 g/dL — ABNORMAL HIGH (ref 6.5–8.1)
eGFR: 51 mL/min/1.73 — ABNORMAL LOW (ref >59)

## 2024-06-17 LAB — BASIC METABOLIC PANEL WITH GFR
BUN/Creatinine Ratio: 52 — ABNORMAL HIGH (ref 9–20)
BUN: 93 mg/dL (ref 6–20)
CO2: 22 mmol/L (ref 20–29)
Calcium: 9.6 mg/dL (ref 8.7–10.2)
Chloride: 94 mmol/L — ABNORMAL LOW (ref 96–106)
Creatinine, Ser: 1.79 mg/dL — ABNORMAL HIGH (ref 0.76–1.27)
Glucose: 96 mg/dL (ref 70–99)
Potassium: 4.1 mmol/L (ref 3.5–5.2)
Sodium: 132 mmol/L — ABNORMAL LOW (ref 134–144)
eGFR: 52 mL/min/1.73 — ABNORMAL LOW (ref >59)

## 2024-06-17 LAB — LIPASE, BLOOD: Lipase: 69 U/L — ABNORMAL HIGH (ref 11–51)

## 2024-06-17 MED ORDER — SODIUM CHLORIDE 0.9 % IV BOLUS
1000.0000 mL | Freq: Once | INTRAVENOUS | Status: AC
  Administered 2024-06-17: 1000 mL via INTRAVENOUS

## 2024-06-17 NOTE — ED Provider Notes (Signed)
 [facility] Emergency Department Provider Note MRN:  [id number]  Arrival date & time: 06/17/24     Chief Complaint   Abnormal labs   History of Present Illness   [name] is a 28 y.o. year-old male with a history of hypertension, diabetes, Prader-Willi syndrome presenting to the ED with chief complaint of abnormal labs.  Sent here for abnormal labs.  Not having any new symptoms recently, feels normal.  Does not really drink a lot of water.  Review of Systems  A thorough review of systems was obtained and all systems are negative except as noted in the HPI and PMH.   Patient's Health History    Past Medical History:  Diagnosis Date   Asthma    Diabetes mellitus without complication (HCC)    Hypertension    Pulmomary htn   Kidney dysfunction    left   Obesity    Obesity hypoventilation syndrome (HCC)    Prader-Willi syndrome    Raynaud's phenomenon    Sleep apnea     Past Surgical History:  Procedure Laterality Date   NISSEN FUNDOPLICATION     TONSILLECTOMY      Family History  Problem Relation Age of Onset   Migraines Mother     Social History   Socioeconomic History   Marital status: Single    Spouse name: Not on file   Number of children: Not on file   Years of education: Not on file   Highest education level: Not on file  Occupational History   Not on file  Tobacco Use   Smoking status: Never    Passive exposure: Never   Smokeless tobacco: Never  Vaping Use   Vaping status: Never Used  Substance and Sexual Activity   Alcohol use: No   Drug use: No   Sexual activity: Never    Birth control/protection: Abstinence  Other Topics Concern   Not on file  Social History Narrative   Lives with mom, very supportive, Molly and brother.  HS education.  Has Prader Willi syndrome.    Social Drivers of Corporate Investment Banker Strain: Not on file  Food Insecurity: Not on file  Transportation Needs: Not on file  Physical  Activity: Not on file  Stress: Not on file  Social Connections: Not on file  Intimate Partner Violence: Not on file     Physical Exam   Vitals:   06/17/24 1933 06/17/24 2231  BP: 136/85 124/87  Pulse:  (!) 113  Resp:  18  Temp:  98.2 F (36.8 C)  SpO2:  96%    CONSTITUTIONAL: Well-appearing, NAD NEURO/PSYCH:  Alert and oriented x 3, no focal deficits EYES:  eyes equal and reactive ENT/NECK:  no LAD, no JVD CARDIO: Regular rate, well-perfused, normal S1 and S2 PULM:  CTAB no wheezing or rhonchi GI/GU:  non-distended, non-tender MSK/SPINE:  No gross deformities, no edema SKIN:  no rash, atraumatic   *Additional and/or pertinent findings included in MDM below  Diagnostic and Interventional Summary    EKG Interpretation Date/Time:    Ventricular Rate:    PR Interval:    QRS Duration:    QT Interval:    QTC Calculation:   R Axis:      Text Interpretation:         Labs Reviewed  COMPREHENSIVE METABOLIC PANEL WITH GFR - Abnormal; Notable for the following components:      Result Value   Chloride 96 (*)  Glucose, Bld 153 (*)    BUN 88 (*)    Creatinine, Ser 1.40 (*)    Total Protein 8.5 (*)    Alkaline Phosphatase 196 (*)    All other components within normal limits  CBC WITH DIFFERENTIAL/PLATELET - Abnormal; Notable for the following components:   Hemoglobin 12.9 (*)    Eosinophils Absolute 0.8 (*)    All other components within normal limits  LIPASE, BLOOD - Abnormal; Notable for the following components:   Lipase 69 (*)    All other components within normal limits  URINALYSIS, ROUTINE W REFLEX MICROSCOPIC    CT Renal Stone Study  Final Result      Medications  sodium chloride  0.9 % bolus 1,000 mL (1,000 mLs Intravenous New Bag/Given 06/17/24 2321)     Procedures  /  Critical Care Procedures  ED Course and Medical Decision Making  Initial Impression and Ddx Sent here for abnormal kidney function tests obtained in the outpatient setting.  No  symptoms.  Mild tachycardia in triage resolved during my assessment.  No pain or tenderness to the abdomen.  Past medical/surgical history that increases complexity of ED encounter: Prader-Willi  Interpretation of Diagnostics I personally reviewed the Laboratory Testing and my interpretation is as follows: Elevated BUN and creatinine with improved creatinine compared to this morning.  Scarred and atrophic right kidney that patient is aware of.  Prominently elevated BUN but patient not having any somnolence or altered mental status of any kind.  Denies any blood in stool or black stool.    Patient Reassessment and Ultimate Disposition/Management     Mild acute kidney injury but with no symptoms and otherwise reassuring evaluation will provide a liter of fluids and discharge home, they are already established with a nephrologist and can follow-up closely.  Patient management required discussion with the following services or consulting groups:  None  Complexity of Problems Addressed Acute illness or injury that poses threat of life of bodily function  Additional Data Reviewed and Analyzed Further history obtained from: Further history from spouse/family member  Additional Factors Impacting ED Encounter Risk Consideration of hospitalization  Ozell HERO. Theadore, MD [facility] Emergency Medicine [facility] [email or web address]  Final Clinical Impressions(s) / ED Diagnoses     ICD-10-CM   1. Acute kidney injury  N17.9     2. Dehydration  E86.0       ED Discharge Orders     None        Discharge Instructions Discussed with and Provided to Patient:    Discharge Instructions      You were evaluated in the Emergency Department and after careful evaluation, we did not find any emergent condition requiring admission or further testing in the hospital.  Your exam/testing today is overall reassuring.  Your testing has revealed some mild worsening kidney numbers  possibly related to dehydration.  Important to increase water intake at home as we discussed.  Call your nephrologist tomorrow morning to set up a close follow-up appointment.  Please return to the Emergency Department if you experience any worsening of your condition.   Thank you for allowing us  to be a part of your care.      Theadore Ozell HERO, MD 06/17/24 [phone]

## 2024-06-17 NOTE — ED Triage Notes (Signed)
 Patient c/o abnormal labs. Family report PCP recommended to be seen for further workup d/t creatinine and BUN to be elevated from blood draw this morning. Family denies nausea and vomiting.

## 2024-06-17 NOTE — Progress Notes (Signed)
 BUN and Cr remains elevated. Again, patient appears normal to mother with normal intake and output.   Ddx includes medications (on ARB), GI bleed (though Hgb stable overall), dehydration, post-renal obstruction, pyelonephritis, rapidly progressive intrinsic renal disease. Given the degree of elevation, I recommended patient present to the ED for fluids and further evaluation. Mother appreciative and will take patient to [facility] for further evaluation now.

## 2024-06-17 NOTE — ED Provider Triage Note (Signed)
 Emergency Medicine Provider Triage Evaluation Note  [name] , a 28 y.o. male  was evaluated in triage.  Patient is with guardian in triage.  Guardian reports significant history of Prader-Willi syndrome.  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  He has had increasing early worse kidney function and reported creatinine has been elevated per nephrologist.  They advised they have tracked the creatinine for several days and advised him to come into the ED because it is not improving.  They are concerned for either kidney etiology or dehydration.  Patient does have history of recurrent UTIs.  Patient reports he does not have any symptoms currently.  Review of Systems  Positive: Worsening creatinine Negative: Chest pain, shortness of breath, back pain, fever  Physical Exam  BP 136/85   Pulse (!) 105   Temp 99.5 F (37.5 C) (Oral)   Resp 18   SpO2 100%  Gen:   Awake, no distress   Resp:  Normal effort  MSK:   Moves extremities without difficulty  Other:  No noted CVA tenderness on exam.  Medical Decision Making  Medically screening exam initiated at 7:53 PM.  Appropriate orders placed.  Copper M Matusik was informed that the remainder of the evaluation will be completed by another provider, this initial triage assessment does not replace that evaluation, and the importance of remaining in the ED until their evaluation is complete.     Myriam Fonda RAMAN, [place] 06/17/24 1955

## 2024-06-17 NOTE — Progress Notes (Signed)
 Spoke with patient's mother about elevated BUN and Cr since patient has Prader-Willi syndrome and global developmental delay. No bleeding episodes. Has been taking amlodipine -olmesartan as prescribed. PO and urinating normally. Has history of atrophic kidney.   Mother to bring patient in today, 06/17/24, at 9:45 AM for lab visit to obtain STAT CMP (check renal and hepatic function) and CBC (check for bleeding). If persistently elevated, given concern for evolving renal failure in the setting of atrophic kidney, would recommend presentation to ED for fluids, urinalysis, and possible nephrology consult.

## 2024-06-17 NOTE — Discharge Instructions (Signed)
 You were evaluated in the Emergency Department and after careful evaluation, we did not find any emergent condition requiring admission or further testing in the hospital.  Your exam/testing today is overall reassuring.  Your testing has revealed some mild worsening kidney numbers possibly related to dehydration.  Important to increase water intake at home as we discussed.  Call your nephrologist tomorrow morning to set up a close follow-up appointment.  Please return to the Emergency Department if you experience any worsening of your condition.   Thank you for allowing us  to be a part of your care.

## 2024-07-10 ENCOUNTER — Other Ambulatory Visit: Payer: Self-pay | Admitting: Student

## 2024-08-09 DIAGNOSIS — E118 Type 2 diabetes mellitus with unspecified complications: Secondary | ICD-10-CM

## 2024-08-09 DIAGNOSIS — E1169 Type 2 diabetes mellitus with other specified complication: Secondary | ICD-10-CM

## 2024-08-10 ENCOUNTER — Other Ambulatory Visit: Payer: Self-pay | Admitting: Student

## 2024-09-10 ENCOUNTER — Other Ambulatory Visit: Payer: Self-pay | Admitting: Student

## 2024-09-10 DIAGNOSIS — E1169 Type 2 diabetes mellitus with other specified complication: Secondary | ICD-10-CM
